# Patient Record
Sex: Female | Born: 1990 | Race: White | Hispanic: No | Marital: Married | Smoking: Never smoker
Health system: Southern US, Community
[De-identification: ages and names within clinical notes are randomized; demographics above are authoritative.]

## PROBLEM LIST (undated history)

## (undated) DIAGNOSIS — Z8759 Personal history of other complications of pregnancy, childbirth and the puerperium: Secondary | ICD-10-CM

## (undated) DIAGNOSIS — N390 Urinary tract infection, site not specified: Secondary | ICD-10-CM

## (undated) DIAGNOSIS — K589 Irritable bowel syndrome without diarrhea: Secondary | ICD-10-CM

## (undated) DIAGNOSIS — R519 Headache, unspecified: Secondary | ICD-10-CM

## (undated) DIAGNOSIS — F419 Anxiety disorder, unspecified: Secondary | ICD-10-CM

## (undated) DIAGNOSIS — N83209 Unspecified ovarian cyst, unspecified side: Secondary | ICD-10-CM

---

## 2015-12-18 DIAGNOSIS — F419 Anxiety disorder, unspecified: Secondary | ICD-10-CM | POA: Diagnosis not present

## 2015-12-18 DIAGNOSIS — Z6831 Body mass index (BMI) 31.0-31.9, adult: Secondary | ICD-10-CM | POA: Diagnosis not present

## 2015-12-18 DIAGNOSIS — Z309 Encounter for contraceptive management, unspecified: Secondary | ICD-10-CM | POA: Diagnosis not present

## 2015-12-25 DIAGNOSIS — Z309 Encounter for contraceptive management, unspecified: Secondary | ICD-10-CM | POA: Diagnosis not present

## 2016-03-17 DIAGNOSIS — Z309 Encounter for contraceptive management, unspecified: Secondary | ICD-10-CM | POA: Diagnosis not present

## 2016-06-09 DIAGNOSIS — Z309 Encounter for contraceptive management, unspecified: Secondary | ICD-10-CM | POA: Diagnosis not present

## 2016-06-29 DIAGNOSIS — Z6830 Body mass index (BMI) 30.0-30.9, adult: Secondary | ICD-10-CM | POA: Diagnosis not present

## 2016-06-29 DIAGNOSIS — F419 Anxiety disorder, unspecified: Secondary | ICD-10-CM | POA: Diagnosis not present

## 2016-08-26 DIAGNOSIS — Z309 Encounter for contraceptive management, unspecified: Secondary | ICD-10-CM | POA: Diagnosis not present

## 2016-12-21 DIAGNOSIS — Z309 Encounter for contraceptive management, unspecified: Secondary | ICD-10-CM | POA: Diagnosis not present

## 2017-03-17 DIAGNOSIS — K589 Irritable bowel syndrome without diarrhea: Secondary | ICD-10-CM | POA: Diagnosis not present

## 2017-03-17 DIAGNOSIS — F419 Anxiety disorder, unspecified: Secondary | ICD-10-CM | POA: Diagnosis not present

## 2017-03-17 DIAGNOSIS — Z309 Encounter for contraceptive management, unspecified: Secondary | ICD-10-CM | POA: Diagnosis not present

## 2017-03-17 DIAGNOSIS — Z6831 Body mass index (BMI) 31.0-31.9, adult: Secondary | ICD-10-CM | POA: Diagnosis not present

## 2017-03-30 DIAGNOSIS — Z6831 Body mass index (BMI) 31.0-31.9, adult: Secondary | ICD-10-CM | POA: Diagnosis not present

## 2017-03-30 DIAGNOSIS — R197 Diarrhea, unspecified: Secondary | ICD-10-CM | POA: Diagnosis not present

## 2017-03-30 DIAGNOSIS — R1110 Vomiting, unspecified: Secondary | ICD-10-CM | POA: Diagnosis not present

## 2017-04-07 DIAGNOSIS — R102 Pelvic and perineal pain: Secondary | ICD-10-CM | POA: Diagnosis not present

## 2017-04-07 DIAGNOSIS — Z0000 Encounter for general adult medical examination without abnormal findings: Secondary | ICD-10-CM | POA: Diagnosis not present

## 2017-04-07 DIAGNOSIS — Z01419 Encounter for gynecological examination (general) (routine) without abnormal findings: Secondary | ICD-10-CM | POA: Diagnosis not present

## 2017-04-07 DIAGNOSIS — N949 Unspecified condition associated with female genital organs and menstrual cycle: Secondary | ICD-10-CM | POA: Diagnosis not present

## 2017-04-28 DIAGNOSIS — N6310 Unspecified lump in the right breast, unspecified quadrant: Secondary | ICD-10-CM | POA: Diagnosis not present

## 2017-04-28 DIAGNOSIS — F419 Anxiety disorder, unspecified: Secondary | ICD-10-CM | POA: Diagnosis not present

## 2017-04-28 DIAGNOSIS — N6320 Unspecified lump in the left breast, unspecified quadrant: Secondary | ICD-10-CM | POA: Diagnosis not present

## 2017-04-28 DIAGNOSIS — Z889 Allergy status to unspecified drugs, medicaments and biological substances status: Secondary | ICD-10-CM | POA: Diagnosis not present

## 2017-10-19 DIAGNOSIS — Z6832 Body mass index (BMI) 32.0-32.9, adult: Secondary | ICD-10-CM | POA: Diagnosis not present

## 2017-10-19 DIAGNOSIS — E669 Obesity, unspecified: Secondary | ICD-10-CM | POA: Diagnosis not present

## 2017-10-19 DIAGNOSIS — R102 Pelvic and perineal pain: Secondary | ICD-10-CM | POA: Diagnosis not present

## 2017-10-28 DIAGNOSIS — R1012 Left upper quadrant pain: Secondary | ICD-10-CM | POA: Diagnosis not present

## 2017-10-28 DIAGNOSIS — R102 Pelvic and perineal pain: Secondary | ICD-10-CM | POA: Diagnosis not present

## 2017-11-16 DIAGNOSIS — R102 Pelvic and perineal pain: Secondary | ICD-10-CM | POA: Diagnosis not present

## 2018-05-09 DIAGNOSIS — R509 Fever, unspecified: Secondary | ICD-10-CM | POA: Insufficient documentation

## 2019-01-25 DIAGNOSIS — N12 Tubulo-interstitial nephritis, not specified as acute or chronic: Secondary | ICD-10-CM

## 2019-01-25 HISTORY — DX: Tubulo-interstitial nephritis, not specified as acute or chronic: N12

## 2019-01-26 DIAGNOSIS — N390 Urinary tract infection, site not specified: Secondary | ICD-10-CM | POA: Insufficient documentation

## 2019-11-27 ENCOUNTER — Encounter: Payer: Self-pay | Admitting: *Deleted

## 2019-11-29 ENCOUNTER — Other Ambulatory Visit: Payer: Self-pay

## 2019-11-29 ENCOUNTER — Encounter (HOSPITAL_COMMUNITY): Payer: Self-pay | Admitting: Emergency Medicine

## 2019-11-29 ENCOUNTER — Ambulatory Visit (HOSPITAL_COMMUNITY)
Admission: EM | Admit: 2019-11-29 | Discharge: 2019-11-29 | Discharge status: Against Medical Advice | Payer: BC Managed Care – PPO | Source: Home / Self Care

## 2019-11-29 ENCOUNTER — Emergency Department (HOSPITAL_COMMUNITY)
Admission: EM | Admit: 2019-11-29 | Discharge: 2019-11-29 | Disposition: A | Discharge status: Home / Self Care | Payer: BC Managed Care – PPO | Attending: Emergency Medicine | Admitting: Emergency Medicine

## 2019-11-29 DIAGNOSIS — Z5321 Procedure and treatment not carried out due to patient leaving prior to being seen by health care provider: Secondary | ICD-10-CM | POA: Insufficient documentation

## 2019-11-29 DIAGNOSIS — O209 Hemorrhage in early pregnancy, unspecified: Secondary | ICD-10-CM | POA: Diagnosis present

## 2019-11-29 DIAGNOSIS — Z3A01 Less than 8 weeks gestation of pregnancy: Secondary | ICD-10-CM | POA: Diagnosis not present

## 2019-11-29 LAB — I-STAT BETA HCG BLOOD, ED (MC, WL, AP ONLY): I-stat hCG, quantitative: <5 m[IU]/mL (ref <5)

## 2019-11-29 NOTE — ED Provider Notes (Cosign Needed)
29 year old G28P0 female presents with pelvic camping and vaginal bleeding that started at 12:30pm today. Pt reports bleeding is like a period but there are no clots. Pelvic cramping is intermittent and mild. She does not have OBGYN set up yet. She had a pregnancy test done at [facility] on 7/23 however hcg was only 17.  On exam BP and HR are mildly elevated. She is alert, mildly tearful. Abdomen is soft and non-tender.  Hcg here is 5. Likely pt never had a true positive test vs she miscarried   [place] Born, Cordelia Poche 11/29/19 1906

## 2019-11-29 NOTE — ED Notes (Signed)
Pt reports that she cannot wait any longer. Encouraged to stay.

## 2019-11-29 NOTE — ED Triage Notes (Signed)
Pt reports being [redacted] weeks pregnant and today at noon developed lower abdominal pain, back pain, and vaginal bleeding.

## 2020-01-08 ENCOUNTER — Encounter: Payer: Self-pay | Admitting: Family Medicine

## 2020-01-08 ENCOUNTER — Encounter: Payer: BC Managed Care – PPO | Admitting: Family Medicine

## 2020-01-08 NOTE — Progress Notes (Signed)
Patient did not keep appointment today. She may call to reschedule.  

## 2020-01-23 ENCOUNTER — Ambulatory Visit
Admission: RE | Admit: 2020-01-23 | Discharge: 2020-01-23 | Disposition: A | Discharge status: Home / Self Care | Payer: BC Managed Care – PPO | Source: Ambulatory Visit

## 2020-01-23 ENCOUNTER — Other Ambulatory Visit: Payer: Self-pay

## 2020-01-23 VITALS — BP 113/79 | HR 94 | Temp 99.1°F | Resp 18 | Wt 178.0 lb

## 2020-01-23 DIAGNOSIS — H938X3 Other specified disorders of ear, bilateral: Secondary | ICD-10-CM | POA: Diagnosis not present

## 2020-01-23 DIAGNOSIS — H6123 Impacted cerumen, bilateral: Secondary | ICD-10-CM

## 2020-01-23 NOTE — ED Provider Notes (Signed)
[facility]   [id number] 01/23/20 Arrival Time: 1011  CC: EAR PAIN  SUBJECTIVE: History from: patient.  Dominique Vega is a 29 y.o. female who presents with of right ear pain for the past 4 days. Also reports bilateral ear fullness. Denies a precipitating event, such as swimming or wearing ear plugs. Patient states the pain is constant and achy in character. Patient has not taken OTC medications for this. Has tried debrox drops. Symptoms are made worse with lying down. Denies fever, chills, fatigue, sinus pain, rhinorrhea, ear discharge, sore throat, SOB, wheezing, chest pain, nausea, changes in bowel or bladder habits.    ROS: As per HPI.  All other pertinent ROS negative.     History reviewed. No pertinent past medical history. History reviewed. No pertinent surgical history. Allergies  Allergen Reactions  . Penicillins Diarrhea and Nausea And Vomiting   No current facility-administered medications on file prior to encounter.   Current Outpatient Medications on File Prior to Encounter  Medication Sig Dispense Refill  . escitalopram (LEXAPRO) 20 MG tablet Take 20 mg by mouth daily.    Marland Kitchen escitalopram (LEXAPRO) 20 MG tablet Take by mouth.     Social History   Socioeconomic History  . Marital status: Married    Spouse name: Not on file  . Number of children: Not on file  . Years of education: Not on file  . Highest education level: Not on file  Occupational History  . Not on file  Tobacco Use  . Smoking status: Never Smoker  . Smokeless tobacco: Never Used  Substance and Sexual Activity  . Alcohol use: Not Currently  . Drug use: Not on file  . Sexual activity: Yes    Birth control/protection: None  Other Topics Concern  . Not on file  Social History Narrative  . Not on file   Social Determinants of Health   Financial Resource Strain:   . Difficulty of Paying Living Expenses: Not on file  Food Insecurity:   . Worried About Programme researcher, broadcasting/film/video in the Last  Year: Not on file  . Ran Out of Food in the Last Year: Not on file  Transportation Needs:   . Lack of Transportation (Medical): Not on file  . Lack of Transportation (Non-Medical): Not on file  Physical Activity:   . Days of Exercise per Week: Not on file  . Minutes of Exercise per Session: Not on file  Stress:   . Feeling of Stress : Not on file  Social Connections:   . Frequency of Communication with Friends and Family: Not on file  . Frequency of Social Gatherings with Friends and Family: Not on file  . Attends Religious Services: Not on file  . Active Member of Clubs or Organizations: Not on file  . Attends Banker Meetings: Not on file  . Marital Status: Not on file  Intimate Partner Violence:   . Fear of Current or Ex-Partner: Not on file  . Emotionally Abused: Not on file  . Physically Abused: Not on file  . Sexually Abused: Not on file   Family History  Problem Relation Age of Onset  . Hypertension Mother   . Diabetes Father     OBJECTIVE:  Vitals:   01/23/20 1028 01/23/20 1031  BP:  113/79  Pulse:  94  Resp:  18  Temp:  99.1 F (37.3 C)  TempSrc:  Oral  SpO2:  96%  Weight: 178 lb (80.7 kg)  General appearance: alert; appears fatigued HEENT: Ears: bilateral EACs with cerumen impaction, TMs pearly gray with visible cone of light, without erythema; Eyes: PERRL, EOMI grossly; Sinuses nontender to palpation; Nose: clear rhinorrhea; Throat: oropharynx mildly erythematous, tonsils 1+ without white tonsillar exudates, uvula midline Neck: supple without LAD Lungs: unlabored respirations, symmetrical air entry; cough: absent; no respiratory distress, CTAB Heart: regular rate and rhythm.  Radial pulses 2+ symmetrical bilaterally Skin: warm and dry Psychological: alert and cooperative; normal mood and affect  Imaging: No results found.   ASSESSMENT & PLAN:  1. Bilateral impacted cerumen   2. Sensation of fullness in both ears     Bilateral  ear irrigation in office May use 1/2 water and 1/2 peroxide solution to keep ears clear of wax and help prevent future buildup Continue to use OTC ibuprofen and/ or tylenol as needed for pain control Follow up with PCP if symptoms persists Return here or go to the ER if you have any new or worsening symptoms   Reviewed expectations re: course of current medical issues. Questions answered. Outlined signs and symptoms indicating need for more acute intervention. Patient verbalized understanding. After Visit Summary given.         Moshe Cipro, NP 01/23/20 1117

## 2020-01-23 NOTE — Discharge Instructions (Addendum)
We have cleaned out your ears in the office today.  You may use a solution of half water and half peroxide at home to clean your ears 2-3 times a week after this to help prevent future wax buildup  Follow-up with this office or with primary care as needed

## 2020-01-23 NOTE — ED Triage Notes (Signed)
Pt is here with bilateral ear pain that started Sunday, pt has not taken ear wax removal drops to relieve discomfort.

## 2020-02-04 ENCOUNTER — Other Ambulatory Visit: Payer: Self-pay | Admitting: Obstetrics & Gynecology

## 2020-02-04 ENCOUNTER — Telehealth: Payer: Self-pay

## 2020-02-04 DIAGNOSIS — O209 Hemorrhage in early pregnancy, unspecified: Secondary | ICD-10-CM

## 2020-02-04 NOTE — Telephone Encounter (Signed)
Sounds like this is taken care of?

## 2020-02-04 NOTE — Telephone Encounter (Signed)
Pt's husband called triage line this morning stating his wife is [redacted] weeks pregnant. She was having spotting and cramping this weekend. They went to ER in [facility] and [facility] told she needed to follow up with her OB. She has an appointment with [facility] tomorrow but pt is having a little heavier bleeding and they wanted to know if she could be seen today.

## 2020-02-04 NOTE — Telephone Encounter (Signed)
Sent secure chat to [facility] due to patient seeing him tomorrow at 1:30. [facility] prefers patient to have an ultrasound prior to appointment. Patient is aware to arrive at 12:50 for her 1:00 pm ultrasound appointment.

## 2020-02-05 ENCOUNTER — Encounter: Payer: BC Managed Care – PPO | Admitting: Obstetrics & Gynecology

## 2020-02-05 ENCOUNTER — Ambulatory Visit (INDEPENDENT_AMBULATORY_CARE_PROVIDER_SITE_OTHER): Payer: BC Managed Care – PPO | Admitting: Obstetrics & Gynecology

## 2020-02-05 ENCOUNTER — Encounter: Payer: Self-pay | Admitting: Obstetrics & Gynecology

## 2020-02-05 ENCOUNTER — Other Ambulatory Visit: Payer: Self-pay

## 2020-02-05 ENCOUNTER — Ambulatory Visit (INDEPENDENT_AMBULATORY_CARE_PROVIDER_SITE_OTHER): Payer: BC Managed Care – PPO

## 2020-02-05 VITALS — BP 120/80 | Ht 60.0 in | Wt 175.0 lb

## 2020-02-05 DIAGNOSIS — O209 Hemorrhage in early pregnancy, unspecified: Secondary | ICD-10-CM

## 2020-02-05 DIAGNOSIS — O039 Complete or unspecified spontaneous abortion without complication: Secondary | ICD-10-CM

## 2020-02-05 NOTE — Progress Notes (Signed)
Obstetric Problem Visit   Chief Complaint: First trimester bleeding  History of Present Illness: Patient is a 29 y.o. [id number] Unknown presenting for first trimester bleeding.  The onset of bleeding was last week.  Is bleeding equal to or greater than normal menstrual flow:  Yes    Also, heavy bleeding and ? Tissue last evening    Some crampy abd pain Any recent trauma:  No Recent intercourse:  No History of prior miscarriage:  Yes     11/2019 SAB, then got pregnant again (current ) prior to normal period    2019, also had first trimester miscarriage Prior ultrasound demonstrating IUP:  Yes Prior ultrasound demonstrating viable IUP:  No Prior Serum HCG:  No Rh status: POS  PMHx: She  has no past medical history on file. Also,  has no past surgical history on file., family history includes Diabetes in her father; Hypertension in her mother.,  reports that she has never smoked. She has never used smokeless tobacco. She reports previous alcohol use.  She has a current medication list which includes the following prescription(s): escitalopram and escitalopram. Also, is allergic to penicillins.  Review of Systems  Constitutional: Negative for chills, fever and malaise/fatigue.  HENT: Negative for congestion, sinus pain and sore throat.   Eyes: Negative for blurred vision and pain.  Respiratory: Negative for cough and wheezing.   Cardiovascular: Negative for chest pain and leg swelling.  Gastrointestinal: Negative for abdominal pain, constipation, diarrhea, heartburn, nausea and vomiting.  Genitourinary: Negative for dysuria, frequency, hematuria and urgency.  Musculoskeletal: Negative for back pain, joint pain, myalgias and neck pain.  Skin: Negative for itching and rash.  Neurological: Negative for dizziness, tremors and weakness.  Endo/Heme/Allergies: Does not bruise/bleed easily.  Psychiatric/Behavioral: Negative for depression. The patient is not nervous/anxious and does not have  insomnia.     Objective: Vitals:   02/05/20 1342  BP: 120/80   Physical Exam Constitutional:      General: She is not in acute distress.    Appearance: She is well-developed.  Musculoskeletal:        General: Normal range of motion.  Neurological:     Mental Status: She is alert and oriented to person, place, and time.  Skin:    General: Skin is warm and dry.  Vitals reviewed.   See [place] results from today Findings:  No intra/extrauterine pregnancy is seen today.  The endometrium varies in thickness and is heterogeneous.  There is no increased blood flow to suggest retained products of conception.  Assessment: 29 y.o. G3P0030 Unknown 1. Complete abortion   Plan:         Condolences were offered to the patient and her family.  I stressed that while emotionally difficult, that this did not occur because of an actions or inactions by the patient.  Somewhere between 10-20% of identified first trimester pregnancies will unfortunately end in miscarriage.  Given this relatively high incidence rate, further diagnostic testing such as chromosome analysis is generally not clinically relevant nor recommended.  Although the chromosomal abnormalities have been implicated at rates as high as 70% in some studies, these are generally random and do not infer and increased risk of recurrence with subsequent pregnancies.  However, 3 or more consecutive first trimester losses are relatively uncommon, and these patient generally do benefit from additional work up to determine a potential modifiable etiology.              We briefly discussed management options including expectant  management, medical management, and surgical management as well as their relative success rates and complications. Approximately 80% of first trimester miscarriages will pass successfully but may require a time frame of up to 8 weeks (ACOG Practice Bulletin 150 May 2015 "Early Pregnancy Loss").  Medical management has literature  supporting its use up to 63 days or [redacted]w[redacted]d gestation and results in a passage rate of 84-85% Environmental education officer Bulletin 143 March 2014 "Medical Management of First-Trimester Abortion").  Dilation and curettage has the highest rate of uterine evacuation, but carries with is operative cost, surgical and anesthetic risk.  While these risk are relatively small they nevertheless include infection, bleeding, uterine perforation, formation of uterine synechia, and in rare cases death.              We discussed repeat ultrasound and or trending HCG levels if the patient wishes to pursue these prior to making her decision.  Clinically I am confident of the diagnosis, but I do not want any doubts in the patient's mind regarding the plan of management she chooses to adopt. The patient elects to proceed with surgical management for her missed abortion. I have discussed with the patient the indications for the procedure. Included in the discussion were the options of therapy, as wall as their individual risks, benefits, and complications. Ample time was given to answer all questions.   While the incidence is low, the risks from this surgery include, but are not limited to, the risks of anesthesia, hemorrhage, infection, perforation, and injury to adjacent structures including bowel, bladder and blood vessels.   2) The patient is Rh POS, rhogam is therefore not indicated to decrease the risk rhesus alloimmunization.    3) Routine bleeding precautions were discussed with the patient prior the conclusion of today's visit.  Plans to try for preg again soon. Will see back in 4-6 weeks to assess again and PAP  Annamarie Major, MD, [facility], [facility] 02/05/2020  1:56 PM

## 2020-02-05 NOTE — Patient Instructions (Signed)
Managing Pregnancy Loss Pregnancy loss can happen any time during a pregnancy. Often the cause is not known. It is rarely because of anything you did. Pregnancy loss in early pregnancy (during the first trimester) is called a miscarriage. This type of pregnancy loss is the most common. Pregnancy loss that happens after 20 weeks of pregnancy is called fetal demise if the baby's heart stops beating before birth. Fetal demise is much less common. Some women experience spontaneous labor shortly after fetal demise resulting in a stillborn birth (stillbirth). Any pregnancy loss can be devastating. You will need to recover both physically and emotionally. Most women are able to get pregnant again after a pregnancy loss and deliver a healthy baby. How to manage emotional recovery  Pregnancy loss is very hard emotionally. You may feel many different emotions while you grieve. You may feel sad and angry. You may also feel guilty. It is normal to have periods of crying. Emotional recovery can take longer than physical recovery. It is different for everyone. Taking these steps can help you in managing this loss:  Remember that it is unlikely you did anything to cause the pregnancy loss.  Share your thoughts and feelings with friends, family, and your partner. Remember that your partner is also recovering emotionally.  Make sure you have a good support system. Do not spend too much time alone.  Meet with a pregnancy loss counselor or join a pregnancy loss support group.  Get enough sleep and eat a healthy diet. Return to regular exercise when you have recovered physically.  Do not use drugs or alcohol to manage your emotions.  Consider seeing a mental health professional to help you recover emotionally.  Ask a friend or loved one to help you decide what to do with any clothing and nursery items you received for your baby. In the case of a stillbirth, many women benefit from taking additional steps in the  grieving process. You may want to:  Hold your baby after the birth.  Name your baby.  Request a birth certificate.  Create a keepsake such as handprints or footprints.  Dress your baby and have a picture taken.  Make funeral arrangements.  Ask for a baptism or blessing. Hospitals have staff members who can help you with all these arrangements. How to recognize emotional stress It is normal to have emotional stress after a pregnancy loss. But emotional stress that lasts a long time or becomes severe requires treatment. Watch out for these signs of severe emotional stress:  Sadness, anger, or guilt that is not going away and is interfering with your normal activities.  Relationship problems that have occurred or gotten worse since the pregnancy loss.  Signs of depression that last longer than 2 weeks. These may include: ? Sadness. ? Anxiety. ? Hopelessness. ? Loss of interest in activities you enjoy. ? Inability to concentrate. ? Trouble sleeping or sleeping too much. ? Loss of appetite or overeating. ? Thoughts of death or of hurting yourself. Follow these instructions at home:  Take over-the-counter and prescription medicines only as told by your health care provider.  Rest at home until your energy level returns. Return to your normal activities as told by your health care provider. Ask your health care provider what activities are safe for you.  When you are ready, meet with your health care provider to discuss steps to take for a future pregnancy.  Keep all follow-up visits as told by your health care provider. This is important.   Where to find support  To help you and your partner with the process of grieving, talk with your health care provider or seek counseling.  Consider meeting with others who have experienced pregnancy loss. Ask your health care provider about support groups and resources. Where to find more information  [place] Department of Health and Human  Services Office on Women's Health: www.womenshealth.gov  American Pregnancy Association: www.americanpregnancy.org Contact a health care provider if:  You continue to experience grief, sadness, or lack of motivation for everyday activities, and those feelings do not improve over time.  You are struggling to recover emotionally, especially if you are using alcohol or substances to help. Get help right away if:  You have thoughts of hurting yourself or others. If you ever feel like you may hurt yourself or others, or have thoughts about taking your own life, get help right away. You can go to your nearest emergency department or call:  Your local emergency services (911 in the [place]  A suicide crisis helpline, such as the National Suicide Prevention Lifeline at 1-800-273-8255. This is open 24 hours a day. Summary  Any pregnancy loss can be difficult physically and emotionally.  You may experience many different emotions while you grieve. Emotional recovery can last longer than physical recovery.  It is normal to have emotional stress after a pregnancy loss. But emotional stress that lasts a long time or becomes severe requires treatment.  See your health care provider if you are struggling emotionally after a pregnancy loss. This information is not intended to replace advice given to you by your health care provider. Make sure you discuss any questions you have with your health care provider. Document Revised: 08/09/2018 Document Reviewed: 06/30/2017 Elsevier Patient Education  2020 [employer]vier [employer].  

## 2020-02-06 ENCOUNTER — Encounter: Payer: Self-pay | Admitting: Obstetrics & Gynecology

## 2020-02-06 NOTE — Progress Notes (Signed)
Changed to GYN encounter

## 2020-02-11 ENCOUNTER — Other Ambulatory Visit: Payer: Self-pay

## 2020-02-11 ENCOUNTER — Emergency Department
Admission: EM | Admit: 2020-02-11 | Discharge: 2020-02-11 | Disposition: A | Discharge status: Home / Self Care | Payer: BC Managed Care – PPO | Attending: Emergency Medicine | Admitting: Emergency Medicine

## 2020-02-11 ENCOUNTER — Emergency Department: Payer: BC Managed Care – PPO

## 2020-02-11 DIAGNOSIS — R102 Pelvic and perineal pain: Secondary | ICD-10-CM

## 2020-02-11 DIAGNOSIS — K529 Noninfective gastroenteritis and colitis, unspecified: Secondary | ICD-10-CM | POA: Diagnosis not present

## 2020-02-11 DIAGNOSIS — N760 Acute vaginitis: Secondary | ICD-10-CM | POA: Insufficient documentation

## 2020-02-11 DIAGNOSIS — R1111 Vomiting without nausea: Secondary | ICD-10-CM | POA: Diagnosis present

## 2020-02-11 DIAGNOSIS — B9689 Other specified bacterial agents as the cause of diseases classified elsewhere: Secondary | ICD-10-CM | POA: Insufficient documentation

## 2020-02-11 DIAGNOSIS — O039 Complete or unspecified spontaneous abortion without complication: Secondary | ICD-10-CM | POA: Diagnosis not present

## 2020-02-11 DIAGNOSIS — Z3A00 Weeks of gestation of pregnancy not specified: Secondary | ICD-10-CM | POA: Diagnosis not present

## 2020-02-11 LAB — URINALYSIS, COMPLETE (UACMP) WITH MICROSCOPIC
Bacteria, UA: NONE SEEN
Bilirubin Urine: NEGATIVE
Glucose, UA: NEGATIVE mg/dL
Ketones, ur: NEGATIVE mg/dL
Leukocytes,Ua: NEGATIVE
Nitrite: NEGATIVE
Protein, ur: NEGATIVE mg/dL
Specific Gravity, Urine: 1.011 (ref 1.005–1.030)
pH: 6 (ref 5.0–8.0)

## 2020-02-11 LAB — COMPREHENSIVE METABOLIC PANEL
ALT: 22 U/L (ref 0–44)
AST: 19 U/L (ref 15–41)
Albumin: 4.2 g/dL (ref 3.5–5.0)
Alkaline Phosphatase: 52 U/L (ref 38–126)
Anion gap: 10 (ref 5–15)
BUN: 9 mg/dL (ref 6–20)
CO2: 22 mmol/L (ref 22–32)
Calcium: 9.1 mg/dL (ref 8.9–10.3)
Chloride: 105 mmol/L (ref 98–111)
Creatinine, Ser: 0.7 mg/dL (ref 0.44–1.00)
GFR, Estimated: >60 mL/min (ref >60)
Glucose, Bld: 92 mg/dL (ref 70–99)
Potassium: 4.5 mmol/L (ref 3.5–5.1)
Sodium: 137 mmol/L (ref 135–145)
Total Bilirubin: 0.7 mg/dL (ref 0.3–1.2)
Total Protein: 7.4 g/dL (ref 6.5–8.1)

## 2020-02-11 LAB — CBC
HCT: 40.4 % (ref 36.0–46.0)
Hemoglobin: 13.6 g/dL (ref 12.0–15.0)
MCH: 30.2 pg (ref 26.0–34.0)
MCHC: 33.7 g/dL (ref 30.0–36.0)
MCV: 89.6 fL (ref 80.0–100.0)
Platelets: 364 10*3/uL (ref 150–400)
RBC: 4.51 MIL/uL (ref 3.87–5.11)
RDW: 12.8 % (ref 11.5–15.5)
WBC: 9.8 10*3/uL (ref 4.0–10.5)
nRBC: 0 % (ref 0.0–0.2)

## 2020-02-11 LAB — HCG, QUANTITATIVE, PREGNANCY: hCG, Beta Chain, Quant, S: 58 m[IU]/mL — ABNORMAL HIGH (ref <5)

## 2020-02-11 LAB — CHLAMYDIA/NGC RT PCR (ARMC ONLY)
Chlamydia Tr: NOT DETECTED
N gonorrhoeae: NOT DETECTED

## 2020-02-11 LAB — WET PREP, GENITAL
Sperm: NONE SEEN
Trich, Wet Prep: NONE SEEN
Yeast Wet Prep HPF POC: NONE SEEN

## 2020-02-11 LAB — LIPASE, BLOOD: Lipase: 24 U/L (ref 11–51)

## 2020-02-11 MED ORDER — ONDANSETRON HCL 4 MG/2ML IJ SOLN: 4.0000 mg | Freq: Once | INTRAMUSCULAR | Status: DC

## 2020-02-11 MED ORDER — LACTATED RINGERS IV BOLUS: 1000.0000 mL | Freq: Once | INTRAVENOUS | Status: DC

## 2020-02-11 MED ORDER — METRONIDAZOLE 500 MG PO TABS: 500.0000 mg | ORAL_TABLET | Freq: Two times a day (BID) | ORAL | 0 refills | Status: AC

## 2020-02-11 MED ORDER — ONDANSETRON 4 MG PO TBDP: 4.0000 mg | ORAL_TABLET | Freq: Three times a day (TID) | ORAL | 0 refills | Status: DC | PRN

## 2020-02-11 NOTE — ED Triage Notes (Signed)
Pt states she had a miscarriage last week and for the past couple of days having vomiting with low grade temp 100.2 and painful urination. States the vaginal bleeding has almost stopped and is having minimal abd pain.

## 2020-02-11 NOTE — ED Notes (Signed)
Pelvic cart done  Specimen to lab  Pt tolerated well.

## 2020-02-11 NOTE — ED Provider Notes (Signed)
[facility] Emergency Department Provider Note   ____________________________________________   First MD Initiated Contact with Patient 02/11/20 1932     (approximate)  I have reviewed the triage vital signs and the nursing notes.   HISTORY  Chief Complaint Emesis    HPI Dominique Vega is a 29 y.o. female with no significant past medical history who presents to the ED complaining of nausea and vomiting with abdominal pain.  Patient states that she had a miscarriage last week and seem to be doing better overall with improving and almost resolving vaginal bleeding.  However overnight she developed diffuse abdominal pain with nausea, vomiting, and diarrhea.  When she woke up this morning, she had a fever of 101 at home, subsequently took ibuprofen with improvement.  She denies any cough, congestion, shortness of breath, or chest pain.  She did have some burning when she urinated earlier and also endorses some bilateral lower back pain.  She was initially evaluated at an urgent care, but referred to the ED for further evaluation.        History reviewed. No pertinent past medical history.  There are no problems to display for this patient.   History reviewed. No pertinent surgical history.  Prior to Admission medications   Medication Sig Start Date End Date Taking? Authorizing Provider  escitalopram (LEXAPRO) 20 MG tablet Take 20 mg by mouth daily. 12/12/19   [provider]  escitalopram (LEXAPRO) 20 MG tablet Take by mouth. Patient not taking: Reported on 02/05/2020    [provider]  metroNIDAZOLE (FLAGYL) 500 MG tablet Take 1 tablet (500 mg total) by mouth 2 (two) times daily for 7 days. 02/11/20 02/18/20  Chesley Noon, MD  ondansetron (ZOFRAN ODT) 4 MG disintegrating tablet Take 1 tablet (4 mg total) by mouth every 8 (eight) hours as needed for nausea or vomiting. 02/11/20   Chesley Noon, MD    Allergies Penicillins  Family  History  Problem Relation Age of Onset  . Hypertension Mother   . Diabetes Father     Social History Social History   Tobacco Use  . Smoking status: Never Smoker  . Smokeless tobacco: Never Used  Substance Use Topics  . Alcohol use: Not Currently  . Drug use: Not on file    Review of Systems  Constitutional: Positive for fever/chills Eyes: No visual changes. ENT: No sore throat. Cardiovascular: Denies chest pain. Respiratory: Denies shortness of breath. Gastrointestinal: Positive for abdominal pain, nausea, vomiting, and diarrhea.  No constipation. Genitourinary: Positive for dysuria. Musculoskeletal: Positive for back pain. Skin: Negative for rash. Neurological: Negative for headaches, focal weakness or numbness.  ____________________________________________   PHYSICAL EXAM:  VITAL SIGNS: ED Triage Vitals  Enc Vitals Group     BP 02/11/20 1341 123/85     Pulse Rate 02/11/20 1341 94     Resp 02/11/20 1341 16     Temp 02/11/20 1341 98.2 F (36.8 C)     Temp Source 02/11/20 1341 Oral     SpO2 02/11/20 1341 98 %     Weight 02/11/20 1338 170 lb (77.1 kg)     Height 02/11/20 1338 5' (1.524 m)     Head Circumference --      Peak Flow --      Pain Score 02/11/20 1346 2     Pain Loc --      Pain Edu? --      Excl. in GC? --     Constitutional: Alert and oriented.  Eyes: Conjunctivae are normal. Head: Atraumatic. Nose: No congestion/rhinnorhea. Mouth/Throat: Mucous membranes are moist. Neck: Normal ROM Cardiovascular: Normal rate, regular rhythm. Grossly normal heart sounds. Respiratory: Normal respiratory effort.  No retractions. Lungs CTAB. Gastrointestinal: Soft and tender to palpation in the bilateral lower quadrants. No distention. Genitourinary: Brownish discharge noted with some mild cervical motion tenderness and right adnexal tenderness. Musculoskeletal: No lower extremity tenderness nor edema. Neurologic:  Normal speech and language. No gross focal  neurologic deficits are appreciated. Skin:  Skin is warm, dry and intact. No rash noted. Psychiatric: Mood and affect are normal. Speech and behavior are normal.  ____________________________________________   LABS (all labs ordered are listed, but only abnormal results are displayed)  Labs Reviewed  WET PREP, GENITAL - Abnormal; Notable for the following components:      Result Value   Clue Cells Wet Prep HPF POC PRESENT (*)    WBC, Wet Prep HPF POC MANY (*)    All other components within normal limits  URINALYSIS, COMPLETE (UACMP) WITH MICROSCOPIC - Abnormal; Notable for the following components:   Color, Urine YELLOW (*)    APPearance HAZY (*)    Hgb urine dipstick SMALL (*)    All other components within normal limits  HCG, QUANTITATIVE, PREGNANCY - Abnormal; Notable for the following components:   hCG, Beta Chain, Quant, S 58 (*)    All other components within normal limits  CHLAMYDIA/NGC RT PCR ([facility] ONLY)  LIPASE, BLOOD  COMPREHENSIVE METABOLIC PANEL  CBC    PROCEDURES  Procedure(s) performed (including Critical Care):  Procedures   ____________________________________________   INITIAL IMPRESSION / ASSESSMENT AND PLAN / ED COURSE       29 year old female, G3 P0-0-3-0 who presents to the ED following miscarriage about 1 week ago, now having increasing abdominal pain with nausea, vomiting, and diarrhea.  She has some tenderness to her lower abdomen on exam, pelvic exam also shows some brownish discharge.  Labs and vitals are thus far reassuring, no fever or leukocytosis noted.  We will further assess for retained products of conception with ultrasound.  I doubt UTI given unremarkable UA.  I would also consider gastroenteritis given her diarrhea if her ultrasound were to be unremarkable.  Ultrasound is negative for acute process, no findings to raise suspicion for retained products of conception.  Wet prep does show clue cells concerning for bacterial  vaginosis and we will start patient on Flagyl.  Given reassuring ultrasound, she is appropriate for discharge home with OB/GYN follow-up.  Given her diarrhea, her symptoms seem more likely to be related to gastroenteritis and she will be prescribed Zofran.  She was counseled to return to the ED for new worsening symptoms, patient agrees with plan.      ____________________________________________   FINAL CLINICAL IMPRESSION(S) / ED DIAGNOSES  Final diagnoses:  Gastroenteritis  Miscarriage  Bacterial vaginosis     ED Discharge Orders         Ordered    ondansetron (ZOFRAN ODT) 4 MG disintegrating tablet  Every 8 hours PRN        02/11/20 2046    metroNIDAZOLE (FLAGYL) 500 MG tablet  2 times daily        02/11/20 2051           Note:  This document was prepared using Dragon voice recognition software and may include unintentional dictation errors.   Chesley Noon, MD 02/11/20 2053

## 2020-02-18 ENCOUNTER — Encounter: Payer: Self-pay | Admitting: Allergy and Immunology

## 2020-02-25 ENCOUNTER — Other Ambulatory Visit: Payer: Self-pay

## 2020-02-25 ENCOUNTER — Encounter: Payer: Self-pay | Admitting: Obstetrics & Gynecology

## 2020-02-25 ENCOUNTER — Ambulatory Visit (INDEPENDENT_AMBULATORY_CARE_PROVIDER_SITE_OTHER): Payer: BC Managed Care – PPO | Admitting: Obstetrics & Gynecology

## 2020-02-25 VITALS — BP 120/70 | Ht 60.0 in | Wt 178.0 lb

## 2020-02-25 DIAGNOSIS — O039 Complete or unspecified spontaneous abortion without complication: Secondary | ICD-10-CM | POA: Diagnosis not present

## 2020-02-25 NOTE — Progress Notes (Signed)
  History of Present Illness:  Dominique Vega is a 29 y.o. G3P0030 WF who was noted to have miscarriage a couple of weeks ago; since that time has had one episode of severe nausea and vomiting; seen in ER and thought to have gastroenteritis.  Also tx for BV.   This did resolve over 1-2 days.  No sx's of BV.  No further bleeding or pelvic pain.  Has not had menses yet.  Desires pregnancy soon.  PMHx: She  has no past medical history on file. Also,  has no past surgical history on file., family history includes Diabetes in her father; Hypertension in her mother.,  reports that she has never smoked. She has never used smokeless tobacco. She reports previous alcohol use. Current Meds  Medication Sig  . escitalopram (LEXAPRO) 20 MG tablet Take 20 mg by mouth daily.  . Also, is allergic to penicillins..  Review of Systems  All other systems reviewed and are negative.   Physical Exam:  BP 120/70   Ht 5' (1.524 m)   Wt 178 lb (80.7 kg)   BMI 34.76 kg/m  Body mass index is 34.76 kg/m. Constitutional: Well nourished, well developed female in no acute distress.  Abdomen: diffusely non tender to palpation, non distended, and no masses, hernias Neuro: Grossly intact Psych:  Normal mood and affect.    Assessment:  Problem List Items Addressed This Visit   Visit Diagnoses    Complete abortion    - completed    Planning new attempt for pregnancy    Discussed options for testing for recurrent miscarriage, pros and cons and expectations    Desires to try for pregnancy after next normal cycle  No s/sx BV Annual/ PAP as scheduled   A total of 20 minutes were spent face-to-face with the patient as well as preparation, review, communication, and documentation during this encounter.   Annamarie Major, MD, [facility], [facility] 02/25/2020  4:39 PM

## 2020-03-10 ENCOUNTER — Ambulatory Visit: Payer: BC Managed Care – PPO | Admitting: Allergy and Immunology

## 2020-03-20 ENCOUNTER — Encounter: Payer: Self-pay | Admitting: Obstetrics & Gynecology

## 2020-03-20 ENCOUNTER — Ambulatory Visit (INDEPENDENT_AMBULATORY_CARE_PROVIDER_SITE_OTHER): Payer: BC Managed Care – PPO | Admitting: Obstetrics & Gynecology

## 2020-03-20 ENCOUNTER — Other Ambulatory Visit: Payer: Self-pay

## 2020-03-20 ENCOUNTER — Other Ambulatory Visit (HOSPITAL_COMMUNITY)
Admission: RE | Admit: 2020-03-20 | Discharge: 2020-03-20 | Disposition: A | Discharge status: Home / Self Care | Payer: BC Managed Care – PPO | Source: Ambulatory Visit | Attending: Obstetrics & Gynecology | Admitting: Obstetrics & Gynecology

## 2020-03-20 VITALS — BP 120/80 | Ht 60.0 in | Wt 177.0 lb

## 2020-03-20 DIAGNOSIS — Z124 Encounter for screening for malignant neoplasm of cervix: Secondary | ICD-10-CM | POA: Diagnosis not present

## 2020-03-20 DIAGNOSIS — Z01419 Encounter for gynecological examination (general) (routine) without abnormal findings: Secondary | ICD-10-CM

## 2020-03-20 NOTE — Patient Instructions (Signed)
Thank you for choosing [facility]. As part of our ongoing efforts to improve patient experience, we would appreciate your feedback. Please fill out the short survey that you will receive by mail or MyChart. Your opinion is important to us! -Dr Yarnell Kozloski  

## 2020-03-20 NOTE — Progress Notes (Signed)
HPI:      [name] is a 29 y.o. G3P0030 who LMP was Patient's last menstrual period was 03/09/2020., she presents today for her annual examination. The patient has no complaints today; she is recovering from 2 miscarriages this year, and does desire to try again for pregnnancy in the near future.  Counseling has helped with the recovery process.  The patient is sexually active. Her last pap: was normal. The patient does perform self breast exams.  There is notable family history of breast or ovarian cancer in her family (grandmother, 56s).  The patient has regular exercise: yes.  The patient denies current symptoms of depression.    GYN History: Contraception: none  PMHx: History reviewed. No pertinent past medical history. History reviewed. No pertinent surgical history. Family History  Problem Relation Age of Onset  . Hypertension Mother   . Diabetes Father    Social History   Tobacco Use  . Smoking status: Never Smoker  . Smokeless tobacco: Never Used  Substance Use Topics  . Alcohol use: Not Currently  . Drug use: Not on file    Current Outpatient Medications:  .  escitalopram (LEXAPRO) 20 MG tablet, Take 20 mg by mouth daily., Disp: , Rfl:  .  escitalopram (LEXAPRO) 20 MG tablet, Take by mouth. , Disp: , Rfl:  .  ondansetron (ZOFRAN ODT) 4 MG disintegrating tablet, Take 1 tablet (4 mg total) by mouth every 8 (eight) hours as needed for nausea or vomiting., Disp: 12 tablet, Rfl: 0 Allergies: Penicillins  Review of Systems  Constitutional: Negative for chills, fever and malaise/fatigue.  HENT: Negative for congestion, sinus pain and sore throat.   Eyes: Negative for blurred vision and pain.  Respiratory: Negative for cough and wheezing.   Cardiovascular: Negative for chest pain and leg swelling.  Gastrointestinal: Negative for abdominal pain, constipation, diarrhea, heartburn, nausea and vomiting.  Genitourinary: Negative for dysuria, frequency, hematuria and  urgency.  Musculoskeletal: Negative for back pain, joint pain, myalgias and neck pain.  Skin: Negative for itching and rash.  Neurological: Negative for dizziness, tremors and weakness.  Endo/Heme/Allergies: Does not bruise/bleed easily.  Psychiatric/Behavioral: Negative for depression. The patient is not nervous/anxious and does not have insomnia.     Objective: BP 120/80   Ht 5' (1.524 m)   Wt 177 lb (80.3 kg)   LMP 03/09/2020   BMI 34.57 kg/m   Filed Weights   03/20/20 1328  Weight: 177 lb (80.3 kg)   Body mass index is 34.57 kg/m. Physical Exam Constitutional:      General: She is not in acute distress.    Appearance: She is well-developed.  Genitourinary:     Pelvic exam was performed with patient supine.     Vagina, uterus and rectum normal.     No lesions in the vagina.     No vaginal bleeding.     No cervical motion tenderness, friability, lesion or polyp.     Uterus is mobile.     Uterus is not enlarged.     No uterine mass detected.    Uterus is midaxial.     No right or left adnexal mass present.     Right adnexa not tender.     Left adnexa not tender.  HENT:     Head: Normocephalic and atraumatic. No laceration.     Right Ear: Hearing normal.     Left Ear: Hearing normal.     Mouth/Throat:     Pharynx: Uvula  midline.  Eyes:     Pupils: Pupils are equal, round, and reactive to light.  Neck:     Thyroid: No thyromegaly.  Cardiovascular:     Rate and Rhythm: Normal rate and regular rhythm.     Heart sounds: No murmur heard.  No friction rub. No gallop.   Pulmonary:     Effort: Pulmonary effort is normal. No respiratory distress.     Breath sounds: Normal breath sounds. No wheezing.  Chest:     Breasts:        Right: No mass, skin change or tenderness.        Left: No mass, skin change or tenderness.  Abdominal:     General: Bowel sounds are normal. There is no distension.     Palpations: Abdomen is soft.     Tenderness: There is no abdominal  tenderness. There is no rebound.  Musculoskeletal:        General: Normal range of motion.     Cervical back: Normal range of motion and neck supple.  Neurological:     Mental Status: She is alert and oriented to person, place, and time.     Cranial Nerves: No cranial nerve deficit.  Skin:    General: Skin is warm and dry.  Psychiatric:        Judgment: Judgment normal.  Vitals reviewed.     Assessment:  ANNUAL EXAM 1. Women's annual routine gynecological examination   2. Screening for cervical cancer      Screening Plan:            1.  Cervical Screening-  Pap smear done today  2. Breast screening- Exam annually and mammogram>40 planned   3. Colonoscopy every 10 years, Hemoccult testing - after age 58  4. Labs managed by PCP  5. Counseling for contraception: no method  The pregnancy intention screening data noted above was reviewed. Potential methods of contraception were discussed. The patient elected to proceed with Pregnant/Seeking Pregnancy.     F/U  Return in about 1 year (around 03/20/2021) for Annual.  Annamarie Major, MD, [facility], [facility] 03/20/2020  1:45 PM

## 2020-03-25 LAB — CYTOLOGY - PAP: Diagnosis: NEGATIVE

## 2020-04-30 ENCOUNTER — Ambulatory Visit: Payer: BC Managed Care – PPO | Admitting: Allergy and Immunology

## 2020-06-02 ENCOUNTER — Other Ambulatory Visit: Payer: Self-pay

## 2020-06-02 ENCOUNTER — Ambulatory Visit
Admission: RE | Admit: 2020-06-02 | Discharge: 2020-06-02 | Disposition: A | Discharge status: Home / Self Care | Payer: Self-pay | Source: Ambulatory Visit

## 2020-06-02 VITALS — BP 134/95 | HR 104 | Temp 99.5°F | Resp 18 | Ht 60.0 in | Wt 165.0 lb

## 2020-06-02 DIAGNOSIS — B349 Viral infection, unspecified: Secondary | ICD-10-CM

## 2020-06-02 MED ORDER — ONDANSETRON 4 MG PO TBDP: 4.0000 mg | ORAL_TABLET | Freq: Three times a day (TID) | ORAL | 0 refills | Status: DC | PRN

## 2020-06-02 NOTE — ED Triage Notes (Signed)
Pt states she awoke with HA at approx 0330, followed by n/v/d. Pt states HA has improved, last emesis at 0600. But diarrhea and nausea continue.  Denies recent sore throat, congestion, runny nose, ear pain, dysuria sx. Pt states h/o migraines and attributes HA to migraine in nature. Took Excedrin migraine at 0430 and reports improvement of symptoms. Here for eval and tx of diarrhea and nausea.

## 2020-06-02 NOTE — Discharge Instructions (Addendum)
This is most likely viral Zofran as needed for nausea and vomiting  Bentyl as needed Stay hydrated.  Covid test pending.  Follow up as needed for continued or worsening symptoms

## 2020-06-02 NOTE — ED Provider Notes (Signed)
[name]    CSN: [id number] Arrival date & time: 06/02/20  1043      History   Chief Complaint Chief Complaint  Patient presents with  . Emesis    HPI Dominique Vega is a 30 y.o. female.   Patient is a 30 year old female past medical history of IBS and migraines.  She reports woke up this morning at approximately 3:00 with migraine followed by nausea, vomiting and diarrhea.  Had one episode of vomiting and 3-4 episodes of diarrhea since this morning.  Headache improved with Excedrin migraine.  Denies any associated sore throat, nasal congestion, rhinorrhea, cough, chest congestion, fevers.     History reviewed. No pertinent past medical history.  There are no problems to display for this patient.   History reviewed. No pertinent surgical history.  OB History    Gravida  3   Para      Term      Preterm      AB  3   Living        SAB  3   IAB      Ectopic      Multiple      Live Births               Home Medications    Prior to Admission medications   Medication Sig Start Date End Date Taking? Authorizing Provider  dicyclomine (BENTYL) 20 MG tablet Take by mouth.   Yes [provider]  escitalopram (LEXAPRO) 20 MG tablet Take 20 mg by mouth daily. 12/12/19  Yes [provider]  escitalopram (LEXAPRO) 20 MG tablet Take by mouth.     [provider]  ondansetron (ZOFRAN ODT) 4 MG disintegrating tablet Take 1 tablet (4 mg total) by mouth every 8 (eight) hours as needed for nausea or vomiting. 06/02/20   Janace Aris, NP    Family History Family History  Problem Relation Age of Onset  . Hypertension Mother   . Diabetes Father     Social History Social History   Tobacco Use  . Smoking status: Never Smoker  . Smokeless tobacco: Never Used  Substance Use Topics  . Alcohol use: Not Currently     Allergies   Penicillins   Review of Systems Review of Systems   Physical Exam Triage Vital  Signs ED Triage Vitals  Enc Vitals Group     BP 06/02/20 1058 (!) 134/95     Pulse Rate 06/02/20 1058 (!) 104     Resp 06/02/20 1058 18     Temp 06/02/20 1058 99.5 F (37.5 C)     Temp Source 06/02/20 1058 Oral     SpO2 06/02/20 1058 96 %     Weight 06/02/20 1056 165 lb (74.8 kg)     Height 06/02/20 1056 5' (1.524 m)     Head Circumference --      Peak Flow --      Pain Score 06/02/20 1054 1     Pain Loc --      Pain Edu? --      Excl. in GC? --    No data found.  Updated Vital Signs BP (!) 134/95 (BP Location: Left Arm)   Pulse (!) 104   Temp 99.5 F (37.5 C) (Oral)   Resp 18   Ht 5' (1.524 m)   Wt 165 lb (74.8 kg)   LMP 05/11/2020   SpO2 96%   BMI 32.22 kg/m   Visual Acuity Right  Eye Distance:   Left Eye Distance:   Bilateral Distance:    Right Eye Near:   Left Eye Near:    Bilateral Near:     Physical Exam Vitals and nursing note reviewed.  Constitutional:      General: She is not in acute distress.    Appearance: Normal appearance. She is not ill-appearing, toxic-appearing or diaphoretic.  HENT:     Head: Normocephalic.     Nose: Nose normal.  Eyes:     Conjunctiva/sclera: Conjunctivae normal.  Pulmonary:     Effort: Pulmonary effort is normal.  Musculoskeletal:        General: Normal range of motion.     Cervical back: Normal range of motion.  Skin:    General: Skin is warm and dry.     Findings: No rash.  Neurological:     Mental Status: She is alert.  Psychiatric:        Mood and Affect: Mood normal.      UC Treatments / Results  Labs (all labs ordered are listed, but only abnormal results are displayed) Labs Reviewed  NOVEL CORONAVIRUS, NAA    EKG   Radiology No results found.  Procedures Procedures (including critical care time)  Medications Ordered in UC Medications - No data to display  Initial Impression / Assessment and Plan / UC Course  I have reviewed the triage vital signs and the nursing notes.  Pertinent labs  & imaging results that were available during my care of the patient were reviewed by me and considered in my medical decision making (see chart for details).     Viral illness Covid test pending. Prescribed Zofran for nausea and vomiting and she already has Bentyl to use  as needed in case this is IBS related.  Recommend stay hydrated Follow up as needed for continued or worsening symptoms  Final Clinical Impressions(s) / UC Diagnoses   Final diagnoses:  Viral illness     Discharge Instructions     This is most likely viral Zofran as needed for nausea and vomiting  Bentyl as needed Stay hydrated.  Covid test pending.  Follow up as needed for continued or worsening symptoms     ED Prescriptions    Medication Sig Dispense Auth. Provider   ondansetron (ZOFRAN ODT) 4 MG disintegrating tablet Take 1 tablet (4 mg total) by mouth every 8 (eight) hours as needed for nausea or vomiting. 12 tablet Shantee Hayne A, NP     PDMP not reviewed this encounter.   Janace Aris, NP 06/02/20 1118

## 2020-06-04 LAB — SARS-COV-2, NAA 2 DAY TAT

## 2020-06-04 LAB — NOVEL CORONAVIRUS, NAA: SARS-CoV-2, NAA: NOT DETECTED

## 2020-06-19 ENCOUNTER — Ambulatory Visit
Admission: RE | Admit: 2020-06-19 | Discharge: 2020-06-19 | Disposition: A | Discharge status: Home / Self Care | Payer: BC Managed Care – PPO | Source: Ambulatory Visit | Attending: Emergency Medicine | Admitting: Emergency Medicine

## 2020-06-19 ENCOUNTER — Other Ambulatory Visit: Payer: Self-pay

## 2020-06-19 VITALS — BP 124/85 | HR 106 | Temp 98.3°F | Resp 18

## 2020-06-19 DIAGNOSIS — K580 Irritable bowel syndrome with diarrhea: Secondary | ICD-10-CM | POA: Diagnosis not present

## 2020-06-19 HISTORY — DX: Irritable bowel syndrome, unspecified: K58.9

## 2020-06-19 MED ORDER — DICYCLOMINE HCL 20 MG PO TABS: 20.0000 mg | ORAL_TABLET | Freq: Three times a day (TID) | ORAL | 0 refills | Status: DC

## 2020-06-19 MED ORDER — RIFAXIMIN 550 MG PO TABS: 550.0000 mg | ORAL_TABLET | Freq: Three times a day (TID) | ORAL | 0 refills | Status: AC

## 2020-06-19 NOTE — ED Provider Notes (Signed)
Dominique Vega    CSN: [id number] Arrival date & time: 06/19/20  1142      History   Chief Complaint Chief Complaint  Patient presents with  . Abdominal Pain    diarrhea    HPI Dominique Vega is a 30 y.o. female.   Dominique Vega is a 30 year old female with complaints of bloating, diarrhea, and cramping that started yesterday.  Patient reports symptoms consistent with previous IBS flares.  Patient has taken Bentyl and Xifaxan in the past for symptoms management, reports Xifaxan has worked better, but has not taken it in about a year.  Patient denies any blood in stool, nausea, vomiting, or fevers.   The history is provided by the patient.  Abdominal Pain Associated symptoms: diarrhea   Associated symptoms: no constipation, no fever, no nausea and no vomiting     Past Medical History:  Diagnosis Date  . Irritable bowel syndrome     There are no problems to display for this patient.   History reviewed. No pertinent surgical history.  OB History    Gravida  3   Para      Term      Preterm      AB  3   Living        SAB  3   IAB      Ectopic      Multiple      Live Births               Home Medications    Prior to Admission medications   Medication Sig Start Date End Date Taking? Authorizing Provider  escitalopram (LEXAPRO) 20 MG tablet Take 20 mg by mouth daily. 12/12/19  Yes [provider]  rifaximin (XIFAXAN) 550 MG TABS tablet Take 1 tablet (550 mg total) by mouth 3 (three) times daily for 14 days. 06/19/20 07/03/20 Yes Ivette Loyal, NP  dicyclomine (BENTYL) 20 MG tablet Take 1 tablet (20 mg total) by mouth 3 (three) times daily before meals. 06/19/20   Ivette Loyal, NP  escitalopram (LEXAPRO) 20 MG tablet Take by mouth.     [provider]  ondansetron (ZOFRAN ODT) 4 MG disintegrating tablet Take 1 tablet (4 mg total) by mouth every 8 (eight) hours as needed for nausea or vomiting. 06/02/20   Janace Aris, NP     Family History Family History  Problem Relation Age of Onset  . Hypertension Mother   . Diabetes Father     Social History Social History   Tobacco Use  . Smoking status: Never Smoker  . Smokeless tobacco: Never Used  Substance Use Topics  . Alcohol use: Not Currently     Allergies   Penicillins   Review of Systems Review of Systems  Constitutional: Negative for fever.  Gastrointestinal: Positive for abdominal pain and diarrhea. Negative for blood in stool, constipation, nausea and vomiting.     Physical Exam Triage Vital Signs ED Triage Vitals [06/19/20 1203]  Enc Vitals Group     BP 124/85     Pulse Rate (!) 106     Resp 18     Temp 98.3 F (36.8 C)     Temp Source Oral     SpO2 97 %     Weight      Height      Head Circumference      Peak Flow      Pain Score      Pain Loc  Pain Edu?      Excl. in GC?    No data found.  Updated Vital Signs BP 124/85 (BP Location: Left Arm)   Pulse (!) 106   Temp 98.3 F (36.8 C) (Oral)   Resp 18   LMP 06/10/2020 (Exact Date)   SpO2 97%   Visual Acuity Right Eye Distance:   Left Eye Distance:   Bilateral Distance:    Right Eye Near:   Left Eye Near:    Bilateral Near:     Physical Exam Constitutional:      General: She is not in acute distress.    Appearance: She is not ill-appearing or diaphoretic.  HENT:     Head: Normocephalic and atraumatic.  Cardiovascular:     Heart sounds: Normal heart sounds.  Pulmonary:     Effort: Pulmonary effort is normal.     Breath sounds: Normal breath sounds.  Abdominal:     General: Bowel sounds are normal. There is no distension.     Palpations: Abdomen is soft. There is no hepatomegaly or splenomegaly.     Tenderness: There is abdominal tenderness in the right upper quadrant and left upper quadrant. There is no right CVA tenderness, left CVA tenderness, guarding or rebound.     Hernia: No hernia is present.  Skin:    General: Skin is warm and dry.   Neurological:     Mental Status: She is alert.      UC Treatments / Results  Labs (all labs ordered are listed, but only abnormal results are displayed) Labs Reviewed - No data to display  EKG   Radiology No results found.  Procedures Procedures (including critical care time)  Medications Ordered in UC Medications - No data to display  Initial Impression / Assessment and Plan / UC Course  I have reviewed the triage vital signs and the nursing notes.  Pertinent labs & imaging results that were available during my care of the patient were reviewed by me and considered in my medical decision making (see chart for details).     Irritable Bowel Syndrome.  Assessment negative for red flags, including diverticulitis or gastroenteritis.  Encourage fluids and BRAT or bland food diet.   She has used Bentyl and Xifaxan in the past, reports better symptom control with Xifaxan. Prescription for bentyl and xifaxan given.    Final Clinical Impressions(s) / UC Diagnoses   Final diagnoses:  Irritable bowel syndrome with diarrhea     Discharge Instructions     You are having an IBS flare.  Follow a BRAT/Bland diet and drink lots of fluids.    Take the Bentyl as needed and Xifaxan as prescribed.    Follow up with PCP/gastroenteritis as needed.   Follow up if symptoms worsen or do not improve in the next few days.     ED Prescriptions    Medication Sig Dispense Auth. Provider   dicyclomine (BENTYL) 20 MG tablet Take 1 tablet (20 mg total) by mouth 3 (three) times daily before meals. 30 tablet Ivette Loyal, NP   rifaximin (XIFAXAN) 550 MG TABS tablet Take 1 tablet (550 mg total) by mouth 3 (three) times daily for 14 days. 42 tablet Ivette Loyal, NP     PDMP not reviewed this encounter.   Ivette Loyal, NP 06/19/20 [phone]

## 2020-06-19 NOTE — ED Triage Notes (Signed)
Pt presents today with what she describes as "an IBS flare up". She was unable to get in to see PCP. She c/o diarrhea, abd pain and bloating since 4am yesterday morning. She requests refills of IBS meds.

## 2020-06-19 NOTE — Discharge Instructions (Addendum)
You are having an IBS flare.  Follow a BRAT/Bland diet and drink lots of fluids.    Take the Bentyl as needed and Xifaxan as prescribed.    Follow up with PCP/gastroenteritis as needed.   Follow up if symptoms worsen or do not improve in the next few days.

## 2020-06-24 ENCOUNTER — Ambulatory Visit
Admission: RE | Admit: 2020-06-24 | Discharge: 2020-06-24 | Disposition: A | Discharge status: Home / Self Care | Payer: BC Managed Care – PPO | Source: Ambulatory Visit | Attending: Family Medicine | Admitting: Family Medicine

## 2020-06-24 ENCOUNTER — Other Ambulatory Visit: Payer: Self-pay

## 2020-06-24 VITALS — BP 118/88 | HR 99 | Temp 99.0°F | Resp 16

## 2020-06-24 DIAGNOSIS — B349 Viral infection, unspecified: Secondary | ICD-10-CM | POA: Diagnosis not present

## 2020-06-24 MED ORDER — ONDANSETRON 4 MG PO TBDP
4.0000 mg | ORAL_TABLET | Freq: Once | ORAL | Status: AC
  Administered 2020-06-24: 4 mg via ORAL

## 2020-06-24 NOTE — Discharge Instructions (Signed)
You most likely have a viral stomach illness.   Use zofran as needed for nausea.  Drink lots of fluids and stick to a BRAT/bland food diet.  Take Tylenol and/or Ibuprofen for pain relief and fever reduction.   Return or go to the Emergency Department if symptoms worsen or do not improve in the next few days.

## 2020-06-24 NOTE — ED Triage Notes (Signed)
Pt c/o abdominal pain, diarrhea last week that pt attributed to her IBS, but fever, n/v, body aches onset yesterday. Tmax 100.8 this morning. Last emesis at 0830 this morning.  Took 400mg  ibuprofen at approx 0900 this morning.  Denies sore throat, congestion, runny nose. States she was exposed to two people at work that had "stomach virus" this past week.

## 2020-06-24 NOTE — ED Provider Notes (Signed)
Dominique Vega    CSN: [id number] Arrival date & time: 06/24/20  1043      History   Chief Complaint Chief Complaint  Patient presents with  . Fever  . Emesis    HPI Dominique Vega is a 30 y.o. female.   Patient is a 30 year old female with complaint of abdominal pain and vomiting for the past 2 days.  Patient reports being in contact with coworkers who had "stomach bug."  Patient reports having fever that was relieved by ibuprofen.  Denies any congestion, cough, or sore throat.   The history is provided by the patient.  Fever Associated symptoms: nausea and vomiting   Associated symptoms: no congestion, no diarrhea and no sore throat   Emesis Associated symptoms: fever   Associated symptoms: no diarrhea and no sore throat     Past Medical History:  Diagnosis Date  . Irritable bowel syndrome     There are no problems to display for this patient.   History reviewed. No pertinent surgical history.  OB History    Gravida  3   Para      Term      Preterm      AB  3   Living        SAB  3   IAB      Ectopic      Multiple      Live Births               Home Medications    Prior to Admission medications   Medication Sig Start Date End Date Taking? Authorizing Provider  dicyclomine (BENTYL) 20 MG tablet Take 1 tablet (20 mg total) by mouth 3 (three) times daily before meals. 06/19/20  Yes Ivette Loyal, NP  escitalopram (LEXAPRO) 20 MG tablet Take 20 mg by mouth daily. 12/12/19  Yes [provider]  rifaximin (XIFAXAN) 550 MG TABS tablet Take 1 tablet (550 mg total) by mouth 3 (three) times daily for 14 days. 06/19/20 07/03/20 Yes Ivette Loyal, NP  escitalopram (LEXAPRO) 20 MG tablet Take by mouth.     [provider]  ondansetron (ZOFRAN ODT) 4 MG disintegrating tablet Take 1 tablet (4 mg total) by mouth every 8 (eight) hours as needed for nausea or vomiting. 06/02/20   Janace Aris, NP    Family History Family History   Problem Relation Age of Onset  . Hypertension Mother   . Diabetes Father     Social History Social History   Tobacco Use  . Smoking status: Never Smoker  . Smokeless tobacco: Never Used  Substance Use Topics  . Alcohol use: Not Currently     Allergies   Penicillins   Review of Systems Review of Systems  Constitutional: Positive for fever.  HENT: Negative for congestion, postnasal drip, sinus pressure, sinus pain and sore throat.   Gastrointestinal: Positive for nausea and vomiting. Negative for abdominal distention, blood in stool and diarrhea.     Physical Exam Triage Vital Signs ED Triage Vitals  Enc Vitals Group     BP 06/24/20 1102 118/88     Pulse Rate 06/24/20 1102 99     Resp 06/24/20 1102 16     Temp 06/24/20 1102 99 F (37.2 C)     Temp Source 06/24/20 1102 Oral     SpO2 06/24/20 1102 97 %     Weight --      Height --  Head Circumference --      Peak Flow --      Pain Score 06/24/20 1054 2     Pain Loc --      Pain Edu? --      Excl. in GC? --    No data found.  Updated Vital Signs BP 118/88 (BP Location: Left Arm)   Pulse 99   Temp 99 F (37.2 C) (Oral)   Resp 16   LMP 06/10/2020 (Exact Date)   SpO2 97%   Visual Acuity Right Eye Distance:   Left Eye Distance:   Bilateral Distance:    Right Eye Near:   Left Eye Near:    Bilateral Near:     Physical Exam Vitals and nursing note reviewed.  Constitutional:      General: She is not in acute distress.    Appearance: She is not ill-appearing or diaphoretic.  HENT:     Head: Normocephalic and atraumatic.  Eyes:     Conjunctiva/sclera: Conjunctivae normal.  Cardiovascular:     Pulses: Normal pulses.     Heart sounds: Normal heart sounds.  Pulmonary:     Effort: Pulmonary effort is normal.     Breath sounds: Normal breath sounds.  Abdominal:     General: Abdomen is flat. There is no distension.     Palpations: Abdomen is soft.     Tenderness: There is abdominal tenderness in  the epigastric area and periumbilical area. There is no guarding or rebound. Negative signs include Murphy's sign, Rovsing's sign, McBurney's sign, psoas sign and obturator sign.  Musculoskeletal:        General: Normal range of motion.  Skin:    General: Skin is warm.  Neurological:     Mental Status: She is alert.  Psychiatric:        Mood and Affect: Mood normal.      UC Treatments / Results  Labs (all labs ordered are listed, but only abnormal results are displayed) Labs Reviewed  COVID-19, FLU A+B NAA    EKG   Radiology No results found.  Procedures Procedures (including critical care time)  Medications Ordered in UC Medications  ondansetron (ZOFRAN-ODT) disintegrating tablet 4 mg (4 mg Oral Given 06/24/20 1105)    Initial Impression / Assessment and Plan / UC Course  I have reviewed the triage vital signs and the nursing notes.  Pertinent labs & imaging results that were available during my care of the patient were reviewed by me and considered in my medical decision making (see chart for details).     Viral Illness.   COVID test pending. Assessment negative for red flags or concerns including diverticulitis or appendicitis.   Patient has prescription for zofran, may take as needed.   Encourage fluid intake.  Brat/Bland food diet.  Tylenol/Ibuprofen for fever reduction and pain relief.   Final Clinical Impressions(s) / UC Diagnoses   Final diagnoses:  Viral illness     Discharge Instructions     You most likely have a viral stomach illness.   Use zofran as needed for nausea.  Drink lots of fluids and stick to a BRAT/bland food diet.  Take Tylenol and/or Ibuprofen for pain relief and fever reduction.   Return or go to the Emergency Department if symptoms worsen or do not improve in the next few days.      ED Prescriptions    None     PDMP not reviewed this encounter.   Ivette Loyal, NP 06/24/20 1124

## 2020-06-26 LAB — COVID-19, FLU A+B NAA
Influenza A, NAA: NOT DETECTED
Influenza B, NAA: NOT DETECTED
SARS-CoV-2, NAA: NOT DETECTED

## 2020-06-27 ENCOUNTER — Telehealth: Payer: BC Managed Care – PPO | Admitting: Family Medicine

## 2020-06-27 ENCOUNTER — Encounter: Payer: Self-pay | Admitting: Family Medicine

## 2020-06-27 DIAGNOSIS — R112 Nausea with vomiting, unspecified: Secondary | ICD-10-CM

## 2020-06-27 NOTE — Progress Notes (Signed)
[name], Dominique Vega are scheduled for a virtual visit with your provider today.    Just as we do with appointments in the office, we must obtain your consent to participate.  Your consent will be active for this visit and any virtual visit you may have with one of our providers in the next 365 days.    If you have a MyChart account, I can also send a copy of this consent to you electronically.  All virtual visits are billed to your insurance company just like a traditional visit in the office.  As this is a virtual visit, video technology does not allow for your provider to perform a traditional examination.  This may limit your provider's ability to fully assess your condition.  If your provider identifies any concerns that need to be evaluated in person or the need to arrange testing such as labs, EKG, etc, we will make arrangements to do so.    Although advances in technology are sophisticated, we cannot ensure that it will always work on either your end or our end.  If the connection with a video visit is poor, we may have to switch to a telephone visit.  With either a video or telephone visit, we are not always able to ensure that we have a secure connection.   I need to obtain your verbal consent now.   Are you willing to proceed with your visit today?   Dominique Vega has provided verbal consent on 06/27/2020 for a virtual visit (video or telephone). Virtual Visit via Video Note  I connected with Dominique Vega on 06/27/20 at  1:45 PM EST by a video enabled telemedicine application and verified that I am speaking with the correct person using two identifiers.  Location: Patient: Home Provider: [place] Patient [facility]   I discussed the limitations of evaluation and management by telemedicine and the availability of in person appointments. The patient expressed understanding and agreed to proceed.  History of Present Illness:   Dominique Vega is a 30 year old female that presents via video with  complaints of, nausea, vomiting and headache over the past 3 days.  Patient states that 3 days ago she began to experience vomiting of stomach contents and nausea.  She reports that her coworker had similar symptoms.  She was tested for COVID-19 on 06/24/2020, which was negative.  Patient was also seen in urgent care and was given a prescription of Zofran.  Patient has been taking Zofran consistently for nausea with some relief and vomiting has subsided.  She continues to endorse a low-grade fever and headache.  Patient states that she is feeling very tired and is unable to return to work at this time.  She denies any blurred vision, dizziness, shortness of breath, cough, urinary symptoms, or diarrhea.  Emesis  This is a new problem. The current episode started in the past 7 days. The problem has been gradually improving. The emesis has an appearance of stomach contents. The maximum temperature recorded prior to her arrival was 100.4 - 100.9 F. The fever has been present for 1 to 2 days. Associated symptoms include a fever and headaches. Pertinent negatives include no arthralgias, chest pain, chills, sweats or URI. Risk factors include ill contacts. She has tried diet change for the symptoms. The treatment provided mild relief.   Past Medical History:  Diagnosis Date  . Irritable bowel syndrome    Social History   Socioeconomic History  . Marital status: Married    Spouse name:  Not on file  . Number of children: Not on file  . Years of education: Not on file  . Highest education level: Not on file  Occupational History  . Not on file  Tobacco Use  . Smoking status: Never Smoker  . Smokeless tobacco: Never Used  Substance and Sexual Activity  . Alcohol use: Not Currently  . Drug use: Not on file  . Sexual activity: Yes    Birth control/protection: None  Other Topics Concern  . Not on file  Social History Narrative  . Not on file   Social Determinants of Health   Financial Resource  Strain: Not on file  Food Insecurity: Not on file  Transportation Needs: Not on file  Physical Activity: Not on file  Stress: Not on file  Social Connections: Not on file  Intimate Partner Violence: Not on file   Allergies  Allergen Reactions  . Penicillins Diarrhea and Nausea And Vomiting   Immunization History  Administered Date(s) Administered  . PFIZER(Purple Top)SARS-COV-2 Vaccination 09/01/2019, 10/01/2019    Assessment and Plan: Non-intractable vomiting with nausea, unspecified vomiting type Continue Zofran as previously prescribed. Hydrate consistently with electrolyte infused fluids like propel water, Gatorade, and/or Powerade. Advance diet as tolerated. Patient requesting note to return to work, note will be available via MyChart  Follow Up Instructions:    I discussed the assessment and treatment plan with the patient. The patient was provided an opportunity to ask questions and all were answered. The patient agreed with the plan and demonstrated an understanding of the instructions.   The patient was advised to call back or seek an in-person evaluation if the symptoms worsen or if the condition fails to improve as anticipated.  I provided 5 minutes of non-face-to-face time during this encounter.  Nolon Nations  APRN, MSN, FNP-C Patient Care [facility] [address] [place], [place] [address] [phone]    Julianne Handler, FNP 06/27/2020  1:38 PM

## 2020-06-27 NOTE — Patient Instructions (Signed)
Nausea and Vomiting, Adult Nausea is feeling sick to your stomach or feeling that you are about to throw up (vomit). Vomiting is when food in your stomach is thrown up and out of the mouth. Throwing up can make you feel weak. It can also make you lose too much water in your body (get dehydrated). If you lose too much water in your body, you may:  Feel tired.  Feel thirsty.  Have a dry mouth.  Have cracked lips.  Go pee (urinate) less often. Older adults and people with other diseases or a weak body defense system (immune system) are at higher risk for losing too much water in the body. If you feel sick to your stomach and you throw up, it is important to follow instructions from your doctor about how to take care of yourself. Follow these instructions at home: Watch your symptoms for any changes. Tell your doctor about them. Follow these instructions to care for yourself at home. Eating and drinking  Take an ORS (oral rehydration solution). This is a drink that is sold at pharmacies and stores.  Drink clear fluids in small amounts as you are able, such as: ? Water. ? Ice chips. ? Fruit juice that has water added (diluted fruit juice). ? Low-calorie sports drinks.  Eat bland, easy-to-digest foods in small amounts as you are able, such as: ? Bananas. ? Applesauce. ? Rice. ? Low-fat (lean) meats. ? Toast. ? Crackers.  Avoid drinking fluids that have a lot of sugar or caffeine in them. This includes energy drinks, sports drinks, and soda.  Avoid alcohol.  Avoid spicy or fatty foods.      General instructions  Take over-the-counter and prescription medicines only as told by your doctor.  Drink enough fluid to keep your pee (urine) pale yellow.  Wash your hands often with soap and water. If you cannot use soap and water, use hand sanitizer.  Make sure that all people in your home wash their hands well and often.  Rest at home while you get better.  Watch your condition  for any changes.  Take slow and deep breaths when you feel sick to your stomach.  Keep all follow-up visits as told by your doctor. This is important. Contact a doctor if:  Your symptoms get worse.  You have new symptoms.  You have a fever.  You cannot drink fluids without throwing up.  You feel sick to your stomach for more than 2 days.  You feel light-headed or dizzy.  You have a headache.  You have muscle cramps.  You have a rash.  You have pain while peeing. Get help right away if:  You have pain in your chest, neck, arm, or jaw.  You feel very weak or you pass out (faint).  You throw up again and again.  You have throw up that is bright red or looks like black coffee grounds.  You have bloody or black poop (stools) or poop that looks like tar.  You have a very bad headache, a stiff neck, or both.  You have very bad pain, cramping, or bloating in your belly (abdomen).  You have trouble breathing.  You are breathing very quickly.  Your heart is beating very quickly.  Your skin feels cold and clammy.  You feel confused.  You have signs of losing too much water in your body, such as: ? Dark pee, very little pee, or no pee. ? Cracked lips. ? Dry mouth. ? Sunken eyes. ?   Sleepiness. ? Weakness. These symptoms may be an emergency. Do not wait to see if the symptoms will go away. Get medical help right away. Call your local emergency services (911 in the [place] Do not drive yourself to the hospital. Summary  Nausea is feeling sick to your stomach or feeling that you are about to throw up (vomit). Vomiting is when food in your stomach is thrown up and out of the mouth.  Follow instructions from your doctor about eating and drinking to keep from losing too much water in your body.  Take over-the-counter and prescription medicines only as told by your doctor.  Contact your doctor if your symptoms get worse or you have new symptoms.  Keep all follow-up  visits as told by your doctor. This is important. This information is not intended to replace advice given to you by your health care provider. Make sure you discuss any questions you have with your health care provider. Document Revised: 08/11/2018 Document Reviewed: 09/27/2017 Elsevier Patient Education  2021 [employer]  

## 2020-07-21 ENCOUNTER — Encounter: Payer: Self-pay | Admitting: *Deleted

## 2020-08-05 ENCOUNTER — Other Ambulatory Visit: Payer: Self-pay

## 2020-08-06 ENCOUNTER — Encounter: Payer: Self-pay | Admitting: Gastroenterology

## 2020-08-06 ENCOUNTER — Ambulatory Visit: Payer: BC Managed Care – PPO | Admitting: Gastroenterology

## 2020-08-06 ENCOUNTER — Other Ambulatory Visit: Payer: Self-pay

## 2020-08-06 VITALS — BP 145/88 | HR 105 | Temp 98.1°F | Ht 60.0 in | Wt 176.5 lb

## 2020-08-06 DIAGNOSIS — R197 Diarrhea, unspecified: Secondary | ICD-10-CM

## 2020-08-06 DIAGNOSIS — R140 Abdominal distension (gaseous): Secondary | ICD-10-CM

## 2020-08-06 DIAGNOSIS — K529 Noninfective gastroenteritis and colitis, unspecified: Secondary | ICD-10-CM

## 2020-08-06 MED ORDER — CLENPIQ 10-3.5-12 MG-GM -GM/160ML PO SOLN: 320.0000 mL | Freq: Once | ORAL | 0 refills | Status: AC

## 2020-08-06 NOTE — Patient Instructions (Signed)
Colonoscopy DX  Diarrhea R19.7 CPT 45378 EGD  EGD Abdominal bloating R14.0 CPT 43235

## 2020-08-06 NOTE — Progress Notes (Signed)
Arlyss Repress, MD [address]  [address]  [place], [place] [address]  Main: [phone]  Fax: [phone]    Gastroenterology Consultation  Referring Provider:     Ronal Fear, NP Primary Care Physician:  Practice, [facility] Primary Gastroenterologist:  Dr. Arlyss Repress Reason for Consultation:     Chronic intermittent diarrhea, abdominal bloating        HPI:   Dominique Vega is a 30 y.o. female referred by Dr. Lindalou Hose, [facility]  for consultation & management of chronic intermittent diarrhea and abdominal bloating.  Patient reports that she has been experiencing chronic symptoms of alternating episodes of constipation and nonbloody diarrhea associated with abdominal bloating and cramps.  The symptoms have been ongoing for several years, she states as far as she remembers.  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  Her episodes of constipation last for 1 week without a BM followed by nonbloody diarrheal episodes when she uses laxative.  She is put on Bentyl 20 mg and she takes during episodes of diarrhea which leads to constipation.  Her weight has been stable.  Her labs including CBC, CMP are unremarkable.  She does not smoke or drink alcohol  NSAIDs: None  Antiplts/Anticoagulants/Anti thrombotics: None  GI Procedures: None 2 first cousins with Crohn's disease  Past Medical History:  Diagnosis Date  . Irritable bowel syndrome     History reviewed. No pertinent surgical history.  Current Outpatient Medications:  .  dicyclomine (BENTYL) 20 MG tablet, Take 1 tablet (20 mg total) by mouth 3 (three) times daily before meals., Disp: 30 tablet, Rfl: 0 .  escitalopram (LEXAPRO) 20 MG tablet, Take 20 mg by mouth daily., Disp: , Rfl:  .  ondansetron (ZOFRAN ODT) 4 MG disintegrating tablet, Take 1 tablet (4 mg total) by mouth every 8 (eight) hours as needed for nausea or  vomiting., Disp: 12 tablet, Rfl: 0 .  Prenatal Vit-Fe Fumarate-FA (PNV PRENATAL PLUS MULTIVITAMIN) 27-1 MG TABS, Take 1 tablet by mouth daily., Disp: , Rfl:  .  Sod Picosulfate-Mag Ox-Cit Acd (CLENPIQ) 10-3.5-12 MG-GM -GM/160ML SOLN, Take 320 mLs by mouth once for 1 dose., Disp: 320 mL, Rfl: 0   Family History  Problem Relation Age of Onset  . Hypertension Mother   . Diabetes Father      Social History   Tobacco Use  . Smoking status: Never Smoker  . Smokeless tobacco: Never Used  Substance Use Topics  . Alcohol use: Not Currently    Allergies as of 08/06/2020 - Review Complete 08/06/2020  Allergen Reaction Noted  . Penicillins Diarrhea and Nausea And Vomiting 11/11/2017    Review of Systems:    All systems reviewed and negative except where noted in HPI.   Physical Exam:  BP (!) 145/88 (BP Location: Left Arm, Patient Position: Sitting, Cuff Size: Normal)   Pulse (!) 105   Temp 98.1 F (36.7 C) (Oral)   Ht 5' (1.524 m)   Wt 176 lb 8 oz (80.1 kg)   BMI 34.47 kg/m  No LMP recorded.  General:   Alert,  Well-developed, well-nourished, pleasant and cooperative in NAD Head:  Normocephalic and atraumatic. Eyes:  Sclera clear, no icterus.   Conjunctiva pink. Ears:  Normal auditory acuity. Nose:  No deformity, discharge, or lesions. Mouth:  No deformity or lesions,oropharynx pink & moist. Neck:  Supple; no masses or thyromegaly. Lungs:  Respirations  even and unlabored.  Clear throughout to auscultation.   No wheezes, crackles, or rhonchi. No acute distress. Heart:  Regular rate and rhythm; no murmurs, clicks, rubs, or gallops. Abdomen:  Normal bowel sounds. Soft, non-tender and mildly distended, tympanic without masses, hepatosplenomegaly or hernias noted.  No guarding or rebound tenderness.   Rectal: Not performed Msk:  Symmetrical without gross deformities. Good, equal movement & strength bilaterally. Pulses:  Normal pulses noted. Extremities:  No clubbing or edema.  No  cyanosis. Neurologic:  Alert and oriented x3;  grossly normal neurologically. Skin:  Intact without significant lesions or rashes. No jaundice. Lymph Nodes:  No significant cervical adenopathy. Psych:  Alert and cooperative. Normal mood and affect.  Imaging Studies: Reviewed  Assessment and Plan:   Dominique Vega is a 30 y.o. Caucasian female with history of miscarriages, family history of Crohn's disease is seen in consultation for chronic symptoms of alternating episodes of constipation and diarrhea associated with abdominal cramps and bloating.  I have discussed regarding avoidance of laxatives and intake of MiraLAX during episodes of constipation to keep her bowels regular.  Decrease Bentyl to 10 mg daily as needed during episodes of diarrhea.  Avoid carbonated beverages, incorporate regular exercise, 3 to 5 days a week.  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

## 2020-08-08 ENCOUNTER — Other Ambulatory Visit: Payer: Self-pay

## 2020-08-08 ENCOUNTER — Encounter: Payer: Self-pay | Admitting: Advanced Practice Midwife

## 2020-08-08 ENCOUNTER — Ambulatory Visit: Payer: BC Managed Care – PPO | Admitting: Advanced Practice Midwife

## 2020-08-08 VITALS — BP 120/80 | Ht 60.0 in | Wt 176.0 lb

## 2020-08-08 DIAGNOSIS — R350 Frequency of micturition: Secondary | ICD-10-CM

## 2020-08-08 DIAGNOSIS — R102 Pelvic and perineal pain: Secondary | ICD-10-CM

## 2020-08-08 DIAGNOSIS — R3915 Urgency of urination: Secondary | ICD-10-CM

## 2020-08-08 DIAGNOSIS — R300 Dysuria: Secondary | ICD-10-CM | POA: Diagnosis not present

## 2020-08-08 MED ORDER — CEPHALEXIN 500 MG PO CAPS: 500.0000 mg | ORAL_CAPSULE | Freq: Three times a day (TID) | ORAL | 0 refills | Status: DC

## 2020-08-10 LAB — URINE CULTURE

## 2020-08-11 ENCOUNTER — Other Ambulatory Visit: Payer: BC Managed Care – PPO

## 2020-08-11 ENCOUNTER — Encounter: Payer: Self-pay | Admitting: Advanced Practice Midwife

## 2020-08-11 NOTE — Progress Notes (Signed)
Patient ID: Notnamed Dominique Vega, female   DOB: 08-29-1990, 30 y.o.   MRN: [id number]  Reason for Consult: Urinary Tract Infection    Subjective:  Date of Service: 08/08/2020  HPI:  Dominique Vega is a 30 y.o. female being seen for UTI symptoms. She has a history of recurrent UTI and also pyelonephritis. Her symptoms today are burning, urgency, frequency, lower abdominal pain and some low back pain.  She is scheduled for a colonoscopy on 4/13 to determine if she has IBD vs IBS. She wonders if it is ok to take antibiotics prior to and during her preparation. We discussed the importance of treating a UTI vs waiting.   Past Medical History:  Diagnosis Date  . Irritable bowel syndrome    Family History  Problem Relation Age of Onset  . Hypertension Mother   . Diabetes Father    History reviewed. No pertinent surgical history.  Short Social History:  Social History   Tobacco Use  . Smoking status: Never Smoker  . Smokeless tobacco: Never Used  Substance Use Topics  . Alcohol use: Not Currently    Allergies  Allergen Reactions  . Penicillins Diarrhea and Nausea And Vomiting    Current Outpatient Medications  Medication Sig Dispense Refill  . cephALEXin (KEFLEX) 500 MG capsule Take 1 capsule (500 mg total) by mouth 3 (three) times daily. 21 capsule 0  . escitalopram (LEXAPRO) 20 MG tablet Take 20 mg by mouth daily.    . Prenatal Vit-Fe Fumarate-FA (PNV PRENATAL PLUS MULTIVITAMIN) 27-1 MG TABS Take 1 tablet by mouth daily.    Marland Kitchen dicyclomine (BENTYL) 20 MG tablet Take 1 tablet (20 mg total) by mouth 3 (three) times daily before meals. (Patient not taking: Reported on 08/08/2020) 30 tablet 0  . ondansetron (ZOFRAN ODT) 4 MG disintegrating tablet Take 1 tablet (4 mg total) by mouth every 8 (eight) hours as needed for nausea or vomiting. (Patient not taking: Reported on 08/08/2020) 12 tablet 0   No current facility-administered medications for this visit.    Review of Systems   Constitutional: Negative for chills and fever.  HENT: Negative for congestion, ear discharge, ear pain, hearing loss, sinus pain and sore throat.   Eyes: Negative for blurred vision and double vision.  Respiratory: Negative for cough, shortness of breath and wheezing.   Cardiovascular: Negative for chest pain, palpitations and leg swelling.  Gastrointestinal: Positive for abdominal pain. Negative for blood in stool, constipation, diarrhea, heartburn, melena, nausea and vomiting.  Genitourinary: Positive for dysuria, frequency and urgency. Negative for flank pain and hematuria.  Musculoskeletal: Positive for back pain. Negative for joint pain and myalgias.  Skin: Negative for itching and rash.  Neurological: Negative for dizziness, tingling, tremors, sensory change, speech change, focal weakness, seizures, loss of consciousness, weakness and headaches.  Endo/Heme/Allergies: Negative for environmental allergies. Does not bruise/bleed easily.  Psychiatric/Behavioral: Negative for depression, hallucinations, memory loss, substance abuse and suicidal ideas. The patient is not nervous/anxious and does not have insomnia.         Objective:  Objective   Vitals:   08/08/20 1455  BP: 120/80  Weight: 176 lb (79.8 kg)  Height: 5' (1.524 m)   Body mass index is 34.37 kg/m. Constitutional: Well nourished, well developed female in no acute distress.  HEENT: normal Skin: Warm and dry.  Cardiovascular: Regular rate and rhythm.   Extremity: no edema Respiratory: Clear to auscultation bilateral. Normal respiratory effort Abdomen: soft, nontender, nondistended, no abnormal masses, no epigastric pain Back: no  CVAT, positive for low back pain Neuro: DTRs 2+, Cranial nerves grossly intact Psych: Alert and Oriented x3. No memory deficits. Normal mood and affect.  MS: normal gait, normal bilateral lower extremity ROM/strength/stability.   Data: Component 3 d ago  Urine Culture, Routine Final  reportAbnormal   Organism ID, Bacteria CommentAbnormal   Comment: Beta hemolytic Streptococcus, group B  10,000-25,000 colony forming units per mL  Penicillin and ampicillin are drugs of choice for treatment of  beta-hemolytic streptococcal infections. Susceptibility testing of  penicillins and other beta-lactam agents approved by the FDA for  treatment of beta-hemolytic streptococcal infections need not be  performed routinely because nonsusceptible isolates are extremely  rare in any beta-hemolytic streptococcus and have not been reported  for Streptococcus pyogenes (group A). (CLSI)   Resulting Agency [facility]        Narrative Performed by: Verdell Carmine Performed at: [address] [facility]  [address], [place], [place] [id number]  Lab Director: Jolene Schimke MD, Phone: [phone]    Specimen Collected: 08/08/20 15:35 Last Resulted: 08/10/20 07:35          Assessment/Plan:     30 y.o. G3 P54 female with likely UTI, will treat empirically Update to visit: 10-25,000 colony count GBS in urine  Rx Keflex sent at time of visit with follow up as needed Message sent to patient regarding usual treatment with penicillin (patient has mild allergy to penicillins)   Tresea Mall CNM [facility] 08/11/2020, 3:41 PM

## 2020-08-12 ENCOUNTER — Encounter: Payer: Self-pay | Admitting: Gastroenterology

## 2020-08-13 ENCOUNTER — Other Ambulatory Visit: Payer: Self-pay

## 2020-08-13 ENCOUNTER — Encounter
Admission: RE | Disposition: A | Discharge status: Home / Self Care | Payer: Self-pay | Source: Home / Self Care | Attending: Gastroenterology

## 2020-08-13 ENCOUNTER — Ambulatory Visit: Payer: BC Managed Care – PPO | Admitting: Anesthesiology

## 2020-08-13 ENCOUNTER — Encounter: Payer: Self-pay | Admitting: Gastroenterology

## 2020-08-13 ENCOUNTER — Ambulatory Visit
Admission: RE | Admit: 2020-08-13 | Discharge: 2020-08-13 | Disposition: A | Discharge status: Home / Self Care | Payer: BC Managed Care – PPO | Attending: Gastroenterology | Admitting: Gastroenterology

## 2020-08-13 DIAGNOSIS — Z79899 Other long term (current) drug therapy: Secondary | ICD-10-CM | POA: Insufficient documentation

## 2020-08-13 DIAGNOSIS — R140 Abdominal distension (gaseous): Secondary | ICD-10-CM

## 2020-08-13 DIAGNOSIS — K529 Noninfective gastroenteritis and colitis, unspecified: Secondary | ICD-10-CM | POA: Diagnosis not present

## 2020-08-13 DIAGNOSIS — R197 Diarrhea, unspecified: Secondary | ICD-10-CM | POA: Diagnosis not present

## 2020-08-13 DIAGNOSIS — K2100 Gastro-esophageal reflux disease with esophagitis, without bleeding: Secondary | ICD-10-CM | POA: Diagnosis not present

## 2020-08-13 DIAGNOSIS — Z8379 Family history of other diseases of the digestive system: Secondary | ICD-10-CM | POA: Insufficient documentation

## 2020-08-13 HISTORY — PX: ESOPHAGOGASTRODUODENOSCOPY (EGD) WITH PROPOFOL: SHX5813

## 2020-08-13 HISTORY — DX: Personal history of other complications of pregnancy, childbirth and the puerperium: Z87.59

## 2020-08-13 HISTORY — DX: Anxiety disorder, unspecified: F41.9

## 2020-08-13 HISTORY — PX: COLONOSCOPY WITH PROPOFOL: SHX5780

## 2020-08-13 LAB — POCT PREGNANCY, URINE
Preg Test, Ur: NEGATIVE
Preg Test, Ur: NEGATIVE

## 2020-08-13 SURGERY — COLONOSCOPY WITH PROPOFOL
Anesthesia: General

## 2020-08-13 MED ORDER — PROPOFOL 500 MG/50ML IV EMUL
INTRAVENOUS | Status: AC
  Filled 2020-08-13: qty 50

## 2020-08-13 MED ORDER — MIDAZOLAM HCL 2 MG/2ML IJ SOLN
INTRAMUSCULAR | Status: AC
  Filled 2020-08-13: qty 2

## 2020-08-13 MED ORDER — OMEPRAZOLE 40 MG PO CPDR: 40.0000 mg | DELAYED_RELEASE_CAPSULE | Freq: Every day | ORAL | 2 refills | Status: DC

## 2020-08-13 MED ORDER — GLYCOPYRROLATE 0.2 MG/ML IJ SOLN
INTRAMUSCULAR | Status: DC | PRN
  Administered 2020-08-13: .2 mg via INTRAVENOUS

## 2020-08-13 MED ORDER — PROPOFOL 500 MG/50ML IV EMUL
INTRAVENOUS | Status: DC | PRN
  Administered 2020-08-13: 125 ug/kg/min via INTRAVENOUS

## 2020-08-13 MED ORDER — DEXMEDETOMIDINE (PRECEDEX) IN NS 20 MCG/5ML (4 MCG/ML) IV SYRINGE
PREFILLED_SYRINGE | INTRAVENOUS | Status: DC | PRN
  Administered 2020-08-13 (×5): 4 ug via INTRAVENOUS

## 2020-08-13 MED ORDER — DEXMEDETOMIDINE (PRECEDEX) IN NS 20 MCG/5ML (4 MCG/ML) IV SYRINGE
PREFILLED_SYRINGE | INTRAVENOUS | Status: AC
  Filled 2020-08-13: qty 5

## 2020-08-13 MED ORDER — SODIUM CHLORIDE 0.9 % IV SOLN: INTRAVENOUS | Status: DC

## 2020-08-13 MED ORDER — LIDOCAINE HCL (CARDIAC) PF 100 MG/5ML IV SOSY
PREFILLED_SYRINGE | INTRAVENOUS | Status: DC | PRN
  Administered 2020-08-13: 50 mg via INTRAVENOUS

## 2020-08-13 MED ORDER — MIDAZOLAM HCL 2 MG/2ML IJ SOLN
INTRAMUSCULAR | Status: DC | PRN
  Administered 2020-08-13: 2 mg via INTRAVENOUS

## 2020-08-13 MED ORDER — PROPOFOL 10 MG/ML IV BOLUS
INTRAVENOUS | Status: DC | PRN
  Administered 2020-08-13 (×2): 20 mg via INTRAVENOUS
  Administered 2020-08-13: 50 mg via INTRAVENOUS

## 2020-08-13 NOTE — Anesthesia Procedure Notes (Signed)
Procedure Name: MAC Date/Time: 08/13/2020 10:22 AM Performed by: Jerrye Noble, CRNA Pre-anesthesia Checklist: Patient identified, Emergency Drugs available, Suction available and Patient being monitored Patient Re-evaluated:Patient Re-evaluated prior to induction

## 2020-08-13 NOTE — Op Note (Signed)
[facility] Patient Name: Dominique Vega Procedure Date: 08/13/2020 10:16 AM MRN: [id number] Account #: [id number] Date of Birth: 06/18/1990 Admit Type: Outpatient Age: 30 Room: [facility] ENDO ROOM 3 Gender: Female Note Status: Finalized Procedure:             Colonoscopy Indications:           Family history of Crohn's disease in a first-degree                         relative, Chronic diarrhea, Clinically significant                         diarrhea of unexplained origin Providers:             [name] MD, MD Referring MD:          Ronal Fear (Referring MD) Medicines:             General Anesthesia Complications:         No immediate complications. Estimated blood loss: None. Procedure:             Pre-Anesthesia Assessment:                        - Prior to the procedure, a History and Physical was                         performed, and patient medications and allergies were                         reviewed. The patient is competent. The risks and                         benefits of the procedure and the sedation options and                         risks were discussed with the patient. All questions                         were answered and informed consent was obtained.                         Patient identification and proposed procedure were                         verified by the physician, the nurse, the                         anesthesiologist, the anesthetist and the technician                         in the pre-procedure area in the procedure room in the                         endoscopy suite. Mental Status Examination: alert and                         oriented. Airway Examination: normal oropharyngeal  airway and neck mobility. Respiratory Examination:                         clear to auscultation. CV Examination: normal.                         Prophylactic Antibiotics: The patient does not require                          prophylactic antibiotics. Prior Anticoagulants: The                         patient has taken no previous anticoagulant or                         antiplatelet agents. ASA Grade Assessment: II - A                         patient with mild systemic disease. After reviewing                         the risks and benefits, the patient was deemed in                         satisfactory condition to undergo the procedure. The                         anesthesia plan was to use general anesthesia.                         Immediately prior to administration of medications,                         the patient was re-assessed for adequacy to receive                         sedatives. The heart rate, respiratory rate, oxygen                         saturations, blood pressure, adequacy of pulmonary                         ventilation, and response to care were monitored                         throughout the procedure. The physical status of the                         patient was re-assessed after the procedure.                        After obtaining informed consent, the colonoscope was                         passed under direct vision. Throughout the procedure,                         the patient's blood pressure, pulse, and oxygen  saturations were monitored continuously. The                         Colonoscope was introduced through the anus and                         advanced to the the terminal ileum, with                         identification of the appendiceal orifice and IC                         valve. The colonoscopy was performed without                         difficulty. The patient tolerated the procedure well.                         The quality of the bowel preparation was evaluated                         using the BBPS [facility] Bowel Preparation Scale) with                         scores of: Right Colon = 3, Transverse Colon = 3 and                          Left Colon = 3 (entire mucosa seen well with no                         residual staining, small fragments of stool or opaque                         liquid). The total BBPS score equals 9. Findings:      The perianal and digital rectal examinations were normal. Pertinent       negatives include normal sphincter tone and no palpable rectal lesions.      The terminal ileum appeared normal.      Normal mucosa was found in the entire colon. Biopsies were taken with a       cold forceps for histology.      The retroflexed view of the distal rectum and anal verge was normal and       showed no anal or rectal abnormalities. Impression:            - The examined portion of the ileum was normal.                        - Normal mucosa in the entire examined colon. Biopsied.                        - The distal rectum and anal verge are normal on                         retroflexion view. Recommendation:        - Discharge patient to home (with escort).                        -  Resume previous diet today.                        - Continue present medications.                        - Await pathology results.                        - Return to my office as previously scheduled. Procedure Code(s):     --- Professional ---                        [phone], Colonoscopy, flexible; with biopsy, single or                         multiple Diagnosis Code(s):     --- Professional ---                        Z83.79, Family history of other diseases of the                         digestive system                        K52.9, Noninfective gastroenteritis and colitis,                         unspecified                        R19.7, Diarrhea, unspecified CPT copyright 2019 American Medical Association. All rights reserved. The codes documented in this report are preliminary and upon coder review may  be revised to meet current compliance requirements. Dr. Libby Maw [name] MD,  MD 08/13/2020 10:45:04 AM This report has been signed electronically. Number of Addenda: 0 Note Initiated On: 08/13/2020 10:16 AM Scope Withdrawal Time: 0 hours 7 minutes 47 seconds  Total Procedure Duration: 0 hours 10 minutes 31 seconds  Estimated Blood Loss:  Estimated blood loss: none.      [facility]

## 2020-08-13 NOTE — Anesthesia Preprocedure Evaluation (Signed)
Anesthesia Evaluation  Patient identified by MRN, date of birth, ID band Patient awake    Reviewed: Allergy & Precautions, NPO status , Patient's Chart, lab work & pertinent test results  History of Anesthesia Complications Negative for: history of anesthetic complications  Airway Mallampati: II  TM Distance: >3 FB Neck ROM: Full    Dental no notable dental hx.    Pulmonary neg pulmonary ROS, neg sleep apnea, neg COPD,    breath sounds clear to auscultation- rhonchi (-) wheezing      Cardiovascular Exercise Tolerance: Good (-) hypertension(-) CAD, (-) Past MI, (-) Cardiac Stents and (-) CABG  Rhythm:Regular Rate:Normal - Systolic murmurs and - Diastolic murmurs    Neuro/Psych neg Seizures PSYCHIATRIC DISORDERS Anxiety negative neurological ROS     GI/Hepatic negative GI ROS, Neg liver ROS,   Endo/Other  negative endocrine ROSneg diabetes  Renal/GU negative Renal ROS     Musculoskeletal negative musculoskeletal ROS (+)   Abdominal (+) + obese,   Peds  Hematology negative hematology ROS (+)   Anesthesia Other Findings Past Medical History: No date: Anxiety No date: History of miscarriage No date: Irritable bowel syndrome   Reproductive/Obstetrics                             Anesthesia Physical Anesthesia Plan  ASA: II  Anesthesia Plan: General   Post-op Pain Management:    Induction: Intravenous  PONV Risk Score and Plan: 2 and Propofol infusion  Airway Management Planned: Natural Airway  Additional Equipment:   Intra-op Plan:   Post-operative Plan:   Informed Consent: I have reviewed the patients History and Physical, chart, labs and discussed the procedure including the risks, benefits and alternatives for the proposed anesthesia with the patient or authorized representative who has indicated his/her understanding and acceptance.     Dental advisory given  Plan  Discussed with: CRNA and Anesthesiologist  Anesthesia Plan Comments:         Anesthesia Quick Evaluation

## 2020-08-13 NOTE — H&P (Signed)
Arlyss Repress, MD [address]  [address]  [place], [place] [address]  Main: [phone]  Fax: [phone] Pager: [phone]  Primary Care Physician:  Practice, [facility] Primary Gastroenterologist:  Dr. Arlyss Repress  Pre-Procedure History & Physical: HPI:  Dominique Vega is a 30 y.o. female is here for an endoscopy and colonoscopy.   Past Medical History:  Diagnosis Date  . Anxiety   . History of miscarriage   . Irritable bowel syndrome     History reviewed. No pertinent surgical history.  Prior to Admission medications   Medication Sig Start Date End Date Taking? Authorizing Provider  escitalopram (LEXAPRO) 20 MG tablet Take 20 mg by mouth daily. 12/12/19  Yes [provider]  cephALEXin (KEFLEX) 500 MG capsule Take 1 capsule (500 mg total) by mouth 3 (three) times daily. 08/08/20   Tresea Mall, CNM  dicyclomine (BENTYL) 20 MG tablet Take 1 tablet (20 mg total) by mouth 3 (three) times daily before meals. Patient not taking: Reported on 08/08/2020 06/19/20   Ivette Loyal, NP  ondansetron (ZOFRAN ODT) 4 MG disintegrating tablet Take 1 tablet (4 mg total) by mouth every 8 (eight) hours as needed for nausea or vomiting. Patient not taking: Reported on 08/08/2020 06/02/20   Janace Aris, NP  Prenatal Vit-Fe Fumarate-FA (PNV PRENATAL PLUS MULTIVITAMIN) 27-1 MG TABS Take 1 tablet by mouth daily.    [provider]    Allergies as of 08/07/2020 - Review Complete 08/06/2020  Allergen Reaction Noted  . Penicillins Diarrhea and Nausea And Vomiting 11/11/2017    Family History  Problem Relation Age of Onset  . Hypertension Mother   . Diabetes Father     Social History   Socioeconomic History  . Marital status: Married    Spouse name: Not on file  . Number of children: Not on file  . Years of education: Not on file  . Highest education level: Not on file  Occupational History  . Not on file  Tobacco Use  . Smoking status: Never  Smoker  . Smokeless tobacco: Never Used  Vaping Use  . Vaping Use: Never used  Substance and Sexual Activity  . Alcohol use: Not Currently  . Drug use: Not on file  . Sexual activity: Yes    Birth control/protection: None  Other Topics Concern  . Not on file  Social History Narrative  . Not on file   Social Determinants of Health   Financial Resource Strain: Not on file  Food Insecurity: Not on file  Transportation Needs: Not on file  Physical Activity: Not on file  Stress: Not on file  Social Connections: Not on file  Intimate Partner Violence: Not on file    Review of Systems: See HPI, otherwise negative ROS  Physical Exam: BP 124/88   Pulse 84   Temp (!) 97.5 F (36.4 C) (Temporal)   Resp 18   Ht 5' (1.524 m)   Wt 77.6 kg   LMP 08/03/2020   SpO2 99%   BMI 33.40 kg/m  General:   Alert,  pleasant and cooperative in NAD Head:  Normocephalic and atraumatic. Neck:  Supple; no masses or thyromegaly. Lungs:  Clear throughout to auscultation.    Heart:  Regular rate and rhythm. Abdomen:  Soft, nontender and nondistended. Normal bowel sounds, without guarding, and without rebound.   Neurologic:  Alert and  oriented x4;  grossly normal neurologically.  Impression/Plan: Dominique Vega is here for an endoscopy and colonoscopy  to be performed for abdominal bloating, diarrhea  Risks, benefits, limitations, and alternatives regarding  endoscopy and colonoscopy have been reviewed with the patient.  Questions have been answered.  All parties agreeable.   Lannette Donath, MD  08/13/2020, 9:27 AM

## 2020-08-13 NOTE — Transfer of Care (Signed)
Immediate Anesthesia Transfer of Care Note  Patient: Dominique Vega  Procedure(s) Performed: COLONOSCOPY WITH PROPOFOL (N/A ) ESOPHAGOGASTRODUODENOSCOPY (EGD) WITH PROPOFOL (N/A )  Patient Location: PACU and Endoscopy Unit  Anesthesia Type:General  Level of Consciousness: drowsy and patient cooperative  Airway & Oxygen Therapy: Patient Spontanous Breathing  Post-op Assessment: Report given to RN and Post -op Vital signs reviewed and stable  Post vital signs: Reviewed and stable  Last Vitals:  Vitals Value Taken Time  BP 103/50 08/13/20 1045  Temp 36.1 C 08/13/20 1045  Pulse 96 08/13/20 1045  Resp 18 08/13/20 1045  SpO2 97 % 08/13/20 1045    Last Pain:  Vitals:   08/13/20 1045  TempSrc: Temporal  PainSc:          Complications: No complications documented.

## 2020-08-13 NOTE — Anesthesia Postprocedure Evaluation (Signed)
Anesthesia Post Note  Patient: Dominique Vega  Procedure(s) Performed: COLONOSCOPY WITH PROPOFOL (N/A ) ESOPHAGOGASTRODUODENOSCOPY (EGD) WITH PROPOFOL (N/A )  Patient location during evaluation: Endoscopy Anesthesia Type: General Level of consciousness: awake and alert and oriented Pain management: pain level controlled Vital Signs Assessment: post-procedure vital signs reviewed and stable Respiratory status: spontaneous breathing, nonlabored ventilation and respiratory function stable Cardiovascular status: blood pressure returned to baseline and stable Postop Assessment: no signs of nausea or vomiting Anesthetic complications: no   No complications documented.   Last Vitals:  Vitals:   08/13/20 1105 08/13/20 1115  BP: 91/73 105/73  Pulse: 80 76  Resp: 14 13  Temp:    SpO2: 100% 100%    Last Pain:  Vitals:   08/13/20 1115  TempSrc:   PainSc: 0-No pain                 Levi Klaiber

## 2020-08-13 NOTE — Op Note (Signed)
[facility] Gastroenterology Patient Name: Dominique Vega Procedure Date: 08/13/2020 10:17 AM MRN: [id number] Account #: [id number] Date of Birth: [birth year] Admit Type: Outpatient Age: 30 Room: [facility] ENDO ROOM 3 Gender: Female Note Status: Finalized Procedure:             Upper GI endoscopy Indications:           Abdominal bloating, Diarrhea Providers:             [name] MD, MD Referring MD:          Ronal Fear (Referring MD) Medicines:             General Anesthesia Complications:         No immediate complications. Estimated blood loss: None. Procedure:             Pre-Anesthesia Assessment:                        - Prior to the procedure, a History and Physical was                         performed, and patient medications and allergies were                         reviewed. The patient is competent. The risks and                         benefits of the procedure and the sedation options and                         risks were discussed with the patient. All questions                         were answered and informed consent was obtained.                         Patient identification and proposed procedure were                         verified by the physician, the nurse, the                         anesthesiologist, the anesthetist and the technician                         in the pre-procedure area in the procedure room in the                         endoscopy suite. Mental Status Examination: alert and                         oriented. Airway Examination: normal oropharyngeal                         airway and neck mobility. Respiratory Examination:                         clear to auscultation. CV Examination: normal.  Prophylactic Antibiotics: The patient does not require                         prophylactic antibiotics. Prior Anticoagulants: The                         patient has taken no previous anticoagulant or                          antiplatelet agents. ASA Grade Assessment: II - A                         patient with mild systemic disease. After reviewing                         the risks and benefits, the patient was deemed in                         satisfactory condition to undergo the procedure. The                         anesthesia plan was to use general anesthesia.                         Immediately prior to administration of medications,                         the patient was re-assessed for adequacy to receive                         sedatives. The heart rate, respiratory rate, oxygen                         saturations, blood pressure, adequacy of pulmonary                         ventilation, and response to care were monitored                         throughout the procedure. The physical status of the                         patient was re-assessed after the procedure.                        After obtaining informed consent, the endoscope was                         passed under direct vision. Throughout the procedure,                         the patient's blood pressure, pulse, and oxygen                         saturations were monitored continuously. The Endoscope                         was introduced through the mouth, and advanced to the  second part of duodenum. The upper GI endoscopy was                         accomplished without difficulty. The patient tolerated                         the procedure well. Findings:      The duodenal bulb and second portion of the duodenum were normal.       Biopsies for histology were taken with a cold forceps for evaluation of       celiac disease.      The entire examined stomach was normal. Biopsies were taken with a cold       forceps for Helicobacter pylori testing.      The cardia and gastric fundus were normal on retroflexion.      Esophagogastric landmarks were identified: the gastroesophageal junction        was found at 34 cm from the incisors.      LA Grade B (one or more mucosal breaks greater than 5 mm, not extending       between the tops of two mucosal folds) esophagitis with no bleeding was       found in the lower third of the esophagus. Impression:            - Normal duodenal bulb and second portion of the                         duodenum. Biopsied.                        - Normal stomach. Biopsied.                        - Esophagogastric landmarks identified.                        - LA Grade B reflux esophagitis with no bleeding. Recommendation:        - Await pathology results.                        - Proceed with colonoscopy as scheduled                        See colonoscopy report Procedure Code(s):     --- Professional ---                        [phone], Esophagogastroduodenoscopy, flexible,                         transoral; with biopsy, single or multiple Diagnosis Code(s):     --- Professional ---                        K21.00, Gastro-esophageal reflux disease with                         esophagitis, without bleeding                        R14.0, Abdominal distension (gaseous)  R19.7, Diarrhea, unspecified CPT copyright 2019 American Medical Association. All rights reserved. The codes documented in this report are preliminary and upon coder review may  be revised to meet current compliance requirements. Dr. Libby Maw [name] MD, MD 08/13/2020 10:30:33 AM This report has been signed electronically. Number of Addenda: 0 Note Initiated On: 08/13/2020 10:17 AM Estimated Blood Loss:  Estimated blood loss: none.      [facility]

## 2020-08-14 ENCOUNTER — Encounter: Payer: Self-pay | Admitting: Gastroenterology

## 2020-08-14 ENCOUNTER — Telehealth: Payer: Self-pay

## 2020-08-14 DIAGNOSIS — K529 Noninfective gastroenteritis and colitis, unspecified: Secondary | ICD-10-CM

## 2020-08-14 DIAGNOSIS — R197 Diarrhea, unspecified: Secondary | ICD-10-CM

## 2020-08-14 LAB — SURGICAL PATHOLOGY

## 2020-08-14 NOTE — Telephone Encounter (Signed)
Patient verbalized understanding of results and will come by to pick up kit  

## 2020-08-14 NOTE — Telephone Encounter (Signed)
-----   Message from [name], MD sent at 08/14/2020  2:00 PM EDT ----- Please order GI profile PCR and let patient know Dx: Chronic unexplained diarrhea

## 2020-08-20 ENCOUNTER — Other Ambulatory Visit: Payer: Self-pay | Admitting: Advanced Practice Midwife

## 2020-08-20 DIAGNOSIS — R8271 Bacteriuria: Secondary | ICD-10-CM

## 2020-08-20 MED ORDER — AMPICILLIN 500 MG PO CAPS: 500.0000 mg | ORAL_CAPSULE | Freq: Four times a day (QID) | ORAL | 0 refills | Status: AC

## 2020-08-20 NOTE — Progress Notes (Signed)
Rx ampicillin sent to treat gbs bacteriuria. Patient reported little improvement from keflex.

## 2020-08-20 NOTE — Telephone Encounter (Signed)
Can you send in a different RX for her since JEG os not in the office.

## 2020-08-21 LAB — GI PROFILE, STOOL, PCR

## 2020-08-25 NOTE — Telephone Encounter (Signed)
Wrote letter for patient.

## 2020-08-26 ENCOUNTER — Encounter: Payer: Self-pay | Admitting: Gastroenterology

## 2020-08-26 NOTE — Telephone Encounter (Signed)
The note yesterday said for yesterday and return today

## 2020-08-27 ENCOUNTER — Ambulatory Visit
Admission: RE | Admit: 2020-08-27 | Discharge: 2020-08-27 | Disposition: A | Discharge status: Home / Self Care | Payer: BC Managed Care – PPO | Source: Ambulatory Visit | Attending: Emergency Medicine | Admitting: Emergency Medicine

## 2020-08-27 ENCOUNTER — Other Ambulatory Visit: Payer: Self-pay

## 2020-08-27 VITALS — BP 118/84 | HR 115 | Temp 99.0°F | Resp 18

## 2020-08-27 DIAGNOSIS — R112 Nausea with vomiting, unspecified: Secondary | ICD-10-CM | POA: Diagnosis not present

## 2020-08-27 DIAGNOSIS — K529 Noninfective gastroenteritis and colitis, unspecified: Secondary | ICD-10-CM

## 2020-08-27 MED ORDER — ONDANSETRON 4 MG PO TBDP: 4.0000 mg | ORAL_TABLET | Freq: Three times a day (TID) | ORAL | 0 refills | Status: DC | PRN

## 2020-08-27 NOTE — ED Triage Notes (Signed)
Diarrhea x 2 days just after eating.  Currently undergoing eval for issues with ongoing diarrhea.  Vomiting started this morning with low grade fever of 100.4.  Has had two episodes today.  Took Ibuprofen and Zofran this morning which has helped.

## 2020-08-27 NOTE — ED Provider Notes (Signed)
Dominique Vega    CSN: [id number] Arrival date & time: 08/27/20  1450      History   Chief Complaint Chief Complaint  Patient presents with  . Emesis    HPI Dominique Vega is a 30 y.o. female.   Patient presents with low-grade fever and vomiting today.  T-max 100.4.  She also reports diarrhea x2 days but states this is her usual.  Treatment attempted at home with Zofran and ibuprofen; patient states the medications have helped and her symptoms have improved.  She denies rash, sore throat, cough, shortness of breath, abdominal pain, or other symptoms.  Her medical history includes irritable bowel syndrome and chronic diarrhea; she is followed by gastroenterology.  Her medical history also includes anxiety, pyelonephritis, abdominal bloating.  The history is provided by the patient and medical records.    Past Medical History:  Diagnosis Date  . Anxiety   . History of miscarriage   . Irritable bowel syndrome     Patient Active Problem List   Diagnosis Date Noted  . Abdominal bloating   . Diarrhea   . Recurrent UTI (urinary tract infection) 01/26/2019  . Pyelonephritis 01/25/2019  . Fever 05/09/2018    Past Surgical History:  Procedure Laterality Date  . COLONOSCOPY WITH PROPOFOL N/A 08/13/2020   Procedure: COLONOSCOPY WITH PROPOFOL;  Surgeon: [name], MD;  Location[facility] ENDOSCOPY;  Service: Gastroenterology;  Laterality: N/A;  . ESOPHAGOGASTRODUODENOSCOPY (EGD) WITH PROPOFOL N/A 08/13/2020   Procedure: ESOPHAGOGASTRODUODENOSCOPY (EGD) WITH PROPOFOL;  Surgeon: [name], MD;  Location: [facility] ENDOSCOPY;  Service: Gastroenterology;  Laterality: N/A;    OB History    Gravida  3   Para      Term      Preterm      AB  3   Living        SAB  3   IAB      Ectopic      Multiple      Live Births               Home Medications    Prior to Admission medications   Medication Sig Start Date End Date Taking? Authorizing Provider   dicyclomine (BENTYL) 20 MG tablet Take 1 tablet (20 mg total) by mouth 3 (three) times daily before meals. 06/19/20  Yes Ivette Loyal, NP  escitalopram (LEXAPRO) 20 MG tablet Take 20 mg by mouth daily. 12/12/19  Yes [provider]  omeprazole (PRILOSEC) 40 MG capsule Take 1 capsule (40 mg total) by mouth daily before breakfast. 08/13/20 09/12/20 Yes Vanga, Loel Dubonnet, MD  ondansetron (ZOFRAN ODT) 4 MG disintegrating tablet Take 1 tablet (4 mg total) by mouth every 8 (eight) hours as needed for nausea or vomiting. 08/27/20  Yes Mickie Bail, NP  Prenatal Vit-Fe Fumarate-FA (PNV PRENATAL PLUS MULTIVITAMIN) 27-1 MG TABS Take 1 tablet by mouth daily.   Yes [provider]  ampicillin (PRINCIPEN) 500 MG capsule Take 1 capsule (500 mg total) by mouth 4 (four) times daily for 7 days. 08/20/20 08/27/20  Tresea Mall, CNM    Family History Family History  Problem Relation Age of Onset  . Hypertension Mother   . Diabetes Father     Social History Social History   Tobacco Use  . Smoking status: Never Smoker  . Smokeless tobacco: Never Used  Vaping Use  . Vaping Use: Never used  Substance Use Topics  . Alcohol use: Not Currently  . Drug use:  Never     Allergies   Penicillins   Review of Systems Review of Systems  Constitutional: Positive for fever. Negative for chills.  HENT: Negative for ear pain and sore throat.   Eyes: Negative for pain and visual disturbance.  Respiratory: Negative for cough and shortness of breath.   Cardiovascular: Negative for chest pain and palpitations.  Gastrointestinal: Positive for diarrhea, nausea and vomiting. Negative for abdominal pain.  Genitourinary: Negative for dysuria and hematuria.  Musculoskeletal: Negative for arthralgias and back pain.  Skin: Negative for color change and rash.  Neurological: Negative for seizures and syncope.  All other systems reviewed and are negative.    Physical Exam Triage Vital Signs ED  Triage Vitals  Enc Vitals Group     BP      Pulse      Resp      Temp      Temp src      SpO2      Weight      Height      Head Circumference      Peak Flow      Pain Score      Pain Loc      Pain Edu?      Excl. in GC?    No data found.  Updated Vital Signs BP 118/84   Pulse (!) 115   Temp 99 F (37.2 C) (Oral)   Resp 18   LMP 08/03/2020   SpO2 97%   Visual Acuity Right Eye Distance:   Left Eye Distance:   Bilateral Distance:    Right Eye Near:   Left Eye Near:    Bilateral Near:     Physical Exam Vitals and nursing note reviewed.  Constitutional:      General: She is not in acute distress.    Appearance: Normal appearance. She is well-developed. She is not ill-appearing.  HENT:     Head: Normocephalic and atraumatic.     Mouth/Throat:     Mouth: Mucous membranes are moist.  Eyes:     Conjunctiva/sclera: Conjunctivae normal.  Cardiovascular:     Rate and Rhythm: Regular rhythm. Tachycardia present.     Heart sounds: Normal heart sounds.  Pulmonary:     Effort: Pulmonary effort is normal. No respiratory distress.     Breath sounds: Normal breath sounds.  Abdominal:     General: Bowel sounds are normal. There is no distension.     Palpations: Abdomen is soft.     Tenderness: There is no abdominal tenderness. There is no right CVA tenderness, left CVA tenderness, guarding or rebound.  Musculoskeletal:     Cervical back: Neck supple.  Skin:    General: Skin is warm and dry.  Neurological:     General: No focal deficit present.     Mental Status: She is alert and oriented to person, place, and time.     Gait: Gait normal.  Psychiatric:        Mood and Affect: Mood normal.        Behavior: Behavior normal.      UC Treatments / Results  Labs (all labs ordered are listed, but only abnormal results are displayed) Labs Reviewed - No data to display  EKG   Radiology No results found.  Procedures Procedures (including critical care  time)  Medications Ordered in UC Medications - No data to display  Initial Impression / Assessment and Plan / UC Course  I have reviewed the triage vital  signs and the nursing notes.  Pertinent labs & imaging results that were available during my care of the patient were reviewed by me and considered in my medical decision making (see chart for details).   Nausea with non-intractable vomiting, chronic diarrhea.  Patient is well-appearing and her exam is reassuring.  She states her symptoms have improved with ibuprofen and Zofran taken at home.  Patient states she needs a refill on the Zofran; Zofran prescription provided.  Instructed patient to take Tylenol instead of ibuprofen.  Instructed her to keep her self hydrated with clear liquids.  ED precautions discussed.  Instructed her to follow-up with her PCP if her symptoms are not improving.  Work note provided per patient request.  She agrees to plan of care.   Final Clinical Impressions(s) / UC Diagnoses   Final diagnoses:  Non-intractable vomiting with nausea, unspecified vomiting type  Chronic diarrhea     Discharge Instructions     Take the antinausea medication as directed.    Keep yourself hydrated with clear liquids, such as water, Gatorade, Pedialyte, Sprite, or ginger ale.    Go to the emergency department if you have acute worsening symptoms.    Follow up with your primary care provider if your symptoms are not improving.         ED Prescriptions    Medication Sig Dispense Auth. Provider   ondansetron (ZOFRAN ODT) 4 MG disintegrating tablet Take 1 tablet (4 mg total) by mouth every 8 (eight) hours as needed for nausea or vomiting. 20 tablet Mickie Bail, NP     PDMP not reviewed this encounter.   Mickie Bail, NP 08/27/20 [phone]

## 2020-08-27 NOTE — Telephone Encounter (Signed)
Called and left a message for call back  

## 2020-08-27 NOTE — Discharge Instructions (Signed)
Take the antinausea medication as directed.    Keep yourself hydrated with clear liquids, such as water, Gatorade, Pedialyte, Sprite, or ginger ale.    Go to the emergency department if you have acute worsening symptoms.    Follow up with your primary care provider if your symptoms are not improving.      

## 2020-08-27 NOTE — Telephone Encounter (Signed)
Informed patient that we could not keep giving her letters. Informed patient that also if we gave her documentation about her health we would more then likely have to do [facility] and we do not do FMAL. Patient verbalized understanding

## 2020-10-02 ENCOUNTER — Ambulatory Visit: Payer: BC Managed Care – PPO | Admitting: Medical

## 2020-11-05 ENCOUNTER — Encounter: Payer: Self-pay | Admitting: *Deleted

## 2020-11-05 ENCOUNTER — Ambulatory Visit: Payer: BC Managed Care – PPO | Admitting: Gastroenterology

## 2020-11-14 ENCOUNTER — Other Ambulatory Visit: Payer: Self-pay | Admitting: Gastroenterology

## 2020-12-23 ENCOUNTER — Other Ambulatory Visit: Payer: Self-pay

## 2020-12-23 ENCOUNTER — Ambulatory Visit: Payer: BC Managed Care – PPO | Admitting: Obstetrics & Gynecology

## 2020-12-23 ENCOUNTER — Encounter: Payer: Self-pay | Admitting: Obstetrics & Gynecology

## 2020-12-23 VITALS — BP 120/80 | Ht 60.0 in | Wt 169.0 lb

## 2020-12-23 DIAGNOSIS — Z3169 Encounter for other general counseling and advice on procreation: Secondary | ICD-10-CM | POA: Diagnosis not present

## 2020-12-23 NOTE — Progress Notes (Signed)
Gynecology Infertility Exam  PCP: Practice, [facility]  Chief Complaint: Difficulty achieving pregnancy  History of Present Illness: Patient is a 30 y.o. G3P0030 presenting for evaluation of infertility. Patient and partner have been attempting conception for 9 months. Marital Status: married for several years. Pregnancies with current partner YES  SHe has had a SAb in 2019, then again in 2021 with back to back SAb (no period in between) for a total of 3 SAb It took a while in 2021 to get pregnant (10 mos) and again now.  Menstrual and Endocrine History LMP: Patient's last menstrual period was 11/29/2020. Menarche:12 Shortest Interval: 26 Longest Interval: 28  days Duration of flow: 4 days Heavy Menses: yes Clots: no Intermenstrual Bleeding: no Postcoital Bleeding: no Dysmenorrhea: no Amenorrhea: not applicable Wt Change: no Hirsutism: no Balding: no Acne: no Galactorrhea: no  Some PMS and premenstrual headache  Obstetrical History SAb x3  Gynecologic History Last PAP: 2021 Previous abdominal or pelvic surgery: no Pelvic Pain:  no Endometriosis: yes Hot Flashes: yes DES Exposure: no Abnormal Pap: no Cervix Cryo/cone: no STD: yes PID: no  Infertility and Endocrine Studies BBT: no Endo. Bx.:no HSG: no PCT: no Laparoscopy: no Hormonal Studies: no Semen analysis: no Other Studies: no Meds: none Other Therapies: Not applicable Insemination:not applicable  Sexual History Frequency: a few times per month(s) Satisfied: yes Dyspareunia: yes, superficial Use of Lubricant: yes Douching: no Number of lifetime sex partners: 1  Contraception None  Family History Thyroid Problems: no Heart Condition or High Blood Pressure: no Blood Clot or Stroke: no Diabetes: no Cancer: yes Birth Defects/Inherited diseases:no Infectious diseases (mumps, TB, Rubella):no Other Medical Problems: no MR/autism/fragile X or POF: no  Habits Cigarettes:     Wife -  no    Husband - no Alcohol:    Wife -  no    Husband - no Marijuana: no  PMHx: She  has a past medical history of Anxiety, History of miscarriage, and Irritable bowel syndrome. Also,  has a past surgical history that includes Colonoscopy with propofol (N/A, 08/13/2020) and Esophagogastroduodenoscopy (egd) with propofol (N/A, 08/13/2020)., family history includes Diabetes in her father; Hypertension in her mother.,  reports that she has never smoked. She has never used smokeless tobacco. She reports that she does not currently use alcohol. She reports that she does not use drugs.  She has a current medication list which includes the following prescription(s): escitalopram, pnv prenatal plus multivitamin, dicyclomine, omeprazole, and ondansetron. Also, is allergic to penicillins.  Review of Systems  Constitutional:  Negative for chills, fever and malaise/fatigue.  HENT:  Negative for congestion, sinus pain and sore throat.   Eyes:  Negative for blurred vision and pain.  Respiratory:  Negative for cough and wheezing.   Cardiovascular:  Negative for chest pain and leg swelling.  Gastrointestinal:  Negative for abdominal pain, constipation, diarrhea, heartburn, nausea and vomiting.  Genitourinary:  Negative for dysuria, frequency, hematuria and urgency.  Musculoskeletal:  Negative for back pain, joint pain, myalgias and neck pain.  Skin:  Negative for itching and rash.  Neurological:  Negative for dizziness, tremors and weakness.  Endo/Heme/Allergies:  Does not bruise/bleed easily.  Psychiatric/Behavioral:  Negative for depression. The patient is not nervous/anxious and does not have insomnia.    Objective: BP 120/80   Ht 5' (1.524 m)   Wt 169 lb (76.7 kg)   LMP 11/29/2020   BMI 33.01 kg/m  Physical Exam Constitutional:      General: She is not  in acute distress.    Appearance: She is well-developed.  Musculoskeletal:        General: Normal range of motion.  Neurological:      Mental Status: She is alert and oriented to person, place, and time.  Skin:    General: Skin is warm and dry.  Vitals reviewed.     Assessment: 30 y.o. G3P0030 1. Infertility counseling H/O SAbx3.  First trimester, one was too soon after a prior, unlikely dx of Recurrent Miscarriage - Anti mullerian hormone; Future - Estradiol; Future - FSH/LH; Future - TSH; Future - ABO AND RH ; Future - DG Hysterogram (HSG); Future  - Consider Clomid  Plan: 1) We discussed the underlying etiologies which may be implicated in a couple experiencing difficulty conceiving.  The average couple will conceive within the span of 1 year with unprotected coitus, with a monthly fecundity rate of 20% or 1 in 5.  Even without further work up or intervention the patient and her partner may be successful in conceiving unassisted, although if an underlying etiology can be identified and addressed fecundity rate may improve.  The work up entails examining for ovulatory function, tubal patency, and ruling out female factor infertility.  These may be looked at concurrently or sequentially.  The downside of sequential work up is that this method may miss issues if more than one compartment is contributing.  She is aware that tubal factor or moderate to severe female factor infertility will require further consultation with a reproductive endocrinologist.  In the case of anovulation, use of Clomid (clomiphen citrate) or Femara (letrazole) were discussed with the understanding the the later is an off-label, but well supported use.  With either of these drugs the risk of multiples increases from the standard population rate of 2% to approximately 10%, with higher order multiples possible but unlikely.  Both drugs may require some time to titrate to the appropriate dosage to ensure consistent ovulation.  Cycles will be limited to 6 cycles on each drug secondary to decreasing rates of conception after 6 cycles.  In addition should patient  be started on ovulation induction with Clomid she was advised to discontinue the drug for any vision changes as this is a rare but potentially permanent side-effect if medication is continued.  We discussed timing of intercourse as well as the use of ovulation predictor kits identify the patient's fertile window each month.     2) Preconception counseling - immunization up to date.  The patient denies any family history of conditions which would warrant preconception genetic counseling or testing on her or her partner.  Instructed to start prenatal vitamins while trying to conceive.    Annamarie Major, MD, [facility], [facility] 12/23/2020  11:03 AM

## 2020-12-23 NOTE — Patient Instructions (Signed)
Hysterosalpingography  Hysterosalpingography is a procedure in which a doctor looks inside a woman's womb (uterus) and fallopian tubes. During the procedure, a dye is injected into the womb. Then X-rays are taken. The dye makes the womb show up on X-rays. A doctor canuse these x-rays to see if there are any problems with the womb and tubes. Tell a doctor about: Any allergies you have. All medicines you are taking, including vitamins, herbs, eye drops, creams, and over-the-counter medicines. Any problems you or family members have had with anesthetic medicines. Any blood disorders you have. Any surgeries you have had. Any medical conditions you have. Whether you are pregnant or may be pregnant. Whether you have been diagnosed with an STI (sexually transmitted infection) or you think you have an STI. What are the risks? Generally, this is a safe procedure. However, problems may occur, including: Infection. Allergic reaction to medicines or dyes. A hole in the womb or tubes. Damage to other structures or organs. What happens before the procedure? Medicines Ask your doctor about changing or stopping: Your normal medicines. Vitamins, herbs, and supplements. Over-the-counter medicines. Do not take aspirin or ibuprofen unless you are told to. General instructions Schedule the procedure after your period stops, but before your next ovulation. This is usually between day 5 and day 10 of your period. Day 1 is the first day of your period. Plan to have a responsible adult take you home from the hospital or clinic. Plan to have a responsible adult care for you for the time you are told after you leave the hospital or clinic. This is important. Pee before the procedure starts. What happens during the procedure? You may be given: A sedative. This medicine helps you relax. An anesthetic. This medicine numbs certain areas of your body. An over-the-counter pain medicine. You will lie down on your  back. Your feet will be placed in foot rests (stirrups). A device (speculum) will be placed into your vagina. This lets your doctor see the lower part of your womb (cervix). Your cervix will be washed with a soap that kills germs. A medicine may be injected into your cervix to numb it. A tube will be passed into your womb. Dye will be passed through the tube and into the womb. The dye may cause some cramps. X-rays will be taken. You may be asked to change positions. The tube will be taken out. The dye will flow out of your vagina on its own. The procedure may vary among doctors and hospitals. What can I expect after procedure? You may have mild cramps and vaginal bleeding. Most of the dye will flow out on its own. Wear a pad if needed. Do not use a tampon. You might feel dizzy or feel like you may vomit (nauseous). Follow these instructions at home: Do not douche, use tampons, or have sex until your doctor says it is safe. Do not take baths, swim, or use a hot tub. Take showers instead of baths for 2 weeks, or for as long as told by your doctor. If you were given a sedative during your procedure, do notdrive or use machines until your doctor says that it is safe. A sedative is a medicine that helps you relax. Take over-the-counter and prescription medicines only as told by your doctor. It is up to you to get the results of your procedure. Ask how to get your results when they are ready. Keep all follow-up visits. Contact a doctor if: You have a fever. You  have chills. You faint. You have very bad cramps. You have a rash that itches or is swollen. You have a bad-smelling fluid coming from your vagina. You have bleeding from your vagina that lasts for more than 4 days. Get help right away if: You feel like you may vomit or you vomit. You have heavy bleeding from the vagina that soaks more than one pad every hour. You have very bad pain in your belly (abdomen). Summary In this  procedure, dye is injected into the womb. Then, X-rays are taken. The dye makes the womb show up on the X-rays. Plan to have this procedure after your period stops, but before your next ovulation. This is often between days 5 and 10 of your last period. Day 1 is the first day of your period. After the procedure, you may have mild cramping and bleeding. This should go away after a short time. Most of the dye will flow out on its own. Wear a pad if needed. Do not douche, use tampons, or have sex until your doctor says it is safe. This information is not intended to replace advice given to you by your health care provider. Make sure you discuss any questions you have with your healthcare provider. Document Revised: 12/05/2019 Document Reviewed: 12/05/2019 Elsevier Patient Education  2022 [employer].

## 2020-12-25 ENCOUNTER — Other Ambulatory Visit: Payer: Self-pay | Admitting: Obstetrics & Gynecology

## 2020-12-25 ENCOUNTER — Telehealth: Payer: Self-pay

## 2020-12-25 DIAGNOSIS — Z3169 Encounter for other general counseling and advice on procreation: Secondary | ICD-10-CM

## 2020-12-25 NOTE — Telephone Encounter (Signed)
Yes today preferred

## 2020-12-25 NOTE — Telephone Encounter (Signed)
Called pt to confirm that she saw where the HSG was scheduled for 9/1 w [facility]. Advised to arr 15 min prior

## 2020-12-25 NOTE — Telephone Encounter (Signed)
-----   Message from Nadara Mustard, MD sent at 12/23/2020 10:56 AM EDT ----- Regarding: HSG [facility] HSG 01/01/21

## 2020-12-25 NOTE — Telephone Encounter (Signed)
Pt calling today, was supposed to have HSG, but she has a positive pregnancy test. Can she come in for blood work to see how far along she is? Please advise/put in order.

## 2020-12-29 ENCOUNTER — Other Ambulatory Visit: Payer: BC Managed Care – PPO

## 2020-12-29 ENCOUNTER — Other Ambulatory Visit: Payer: Self-pay

## 2020-12-29 DIAGNOSIS — Z3169 Encounter for other general counseling and advice on procreation: Secondary | ICD-10-CM

## 2020-12-29 NOTE — Telephone Encounter (Signed)
Left voicemail pending my chart message review. No response from patient yet

## 2020-12-30 LAB — FSH/LH
FSH: 0.7 m[IU]/mL
LH: 1.2 m[IU]/mL

## 2020-12-30 LAB — TSH: TSH: 1.39 u[IU]/mL (ref 0.450–4.500)

## 2020-12-30 LAB — ABO AND RH: Rh Factor: POSITIVE

## 2020-12-30 LAB — ESTRADIOL: Estradiol: 149 pg/mL

## 2020-12-30 NOTE — Telephone Encounter (Signed)
Patient is scheduled for 12/29/20 for labs

## 2020-12-30 NOTE — Telephone Encounter (Signed)
Check w Sandy/ Lab as to why or when Beta hCG is going to be done on blood draw from yesterday.  I see it ordered but not done

## 2020-12-30 NOTE — Telephone Encounter (Signed)
Sandy did not draw the Beta, She's going to call and have it added to the blood that was drawn yesterday.

## 2020-12-31 NOTE — Telephone Encounter (Signed)
Still not back; check w Victorino Dike (or [place]) and if not do-able then we need to have her come in to retest

## 2021-01-01 ENCOUNTER — Telehealth: Payer: Self-pay

## 2021-01-01 ENCOUNTER — Other Ambulatory Visit: Payer: Self-pay | Admitting: Obstetrics & Gynecology

## 2021-01-01 ENCOUNTER — Inpatient Hospital Stay: Admission: RE | Admit: 2021-01-01 | Payer: BC Managed Care – PPO | Source: Ambulatory Visit

## 2021-01-01 ENCOUNTER — Other Ambulatory Visit: Payer: BC Managed Care – PPO

## 2021-01-01 DIAGNOSIS — N926 Irregular menstruation, unspecified: Secondary | ICD-10-CM

## 2021-01-01 LAB — BETA HCG QUANT (REF LAB): hCG Quant: 64 m[IU]/mL

## 2021-01-01 LAB — SPECIMEN STATUS REPORT

## 2021-01-01 NOTE — Telephone Encounter (Signed)
[facility] calling with stat Beta HCG - 64.  PH notified.  No hsg today  Will follow up w office visit and later ultrasound.  Pt aware no procedure today; she is pregnant; will get back with her if she needs to be seen sooner; pt also asking about f/u blood work. Msg sent to [facility] in secure chat.

## 2021-01-03 LAB — ANTI MULLERIAN HORMONE: ANTI-MULLERIAN HORMONE (AMH): 1.4 ng/mL

## 2021-01-06 ENCOUNTER — Other Ambulatory Visit: Payer: BC Managed Care – PPO

## 2021-01-06 ENCOUNTER — Other Ambulatory Visit: Payer: Self-pay

## 2021-01-06 DIAGNOSIS — N926 Irregular menstruation, unspecified: Secondary | ICD-10-CM

## 2021-01-07 LAB — BETA HCG QUANT (REF LAB): hCG Quant: 5651 m[IU]/mL

## 2021-01-12 ENCOUNTER — Other Ambulatory Visit: Payer: Self-pay | Admitting: Obstetrics & Gynecology

## 2021-01-12 MED ORDER — ONDANSETRON 4 MG PO TBDP: 4.0000 mg | ORAL_TABLET | Freq: Three times a day (TID) | ORAL | 0 refills | Status: DC | PRN

## 2021-01-12 NOTE — Telephone Encounter (Signed)
It is ok to perform her routine duties, daily functioning, even work.  Avoid strenuous lifting or activities.  Avoid sex until we get ultrasound and bleeding stops.

## 2021-01-12 NOTE — Telephone Encounter (Signed)
Should she continue to rest? Or just go on about her day?

## 2021-01-12 NOTE — Telephone Encounter (Signed)
Can you send in a rx for her?

## 2021-01-12 NOTE — Telephone Encounter (Signed)
Do you approve this? Note for being out of work today.

## 2021-01-13 NOTE — Telephone Encounter (Signed)
Note approved

## 2021-01-14 ENCOUNTER — Other Ambulatory Visit: Payer: Self-pay

## 2021-01-14 ENCOUNTER — Encounter: Payer: Self-pay | Admitting: Obstetrics and Gynecology

## 2021-01-14 ENCOUNTER — Ambulatory Visit: Payer: BC Managed Care – PPO | Admitting: Obstetrics and Gynecology

## 2021-01-14 VITALS — BP 110/60 | Ht 60.0 in | Wt 168.0 lb

## 2021-01-14 DIAGNOSIS — N96 Recurrent pregnancy loss: Secondary | ICD-10-CM | POA: Diagnosis not present

## 2021-01-14 DIAGNOSIS — Z3689 Encounter for other specified antenatal screening: Secondary | ICD-10-CM | POA: Diagnosis not present

## 2021-01-14 DIAGNOSIS — O209 Hemorrhage in early pregnancy, unspecified: Secondary | ICD-10-CM

## 2021-01-14 MED ORDER — PROGESTERONE 4 % VA GEL: 1.0000 | Freq: Every day | VAGINAL | 0 refills | Status: AC

## 2021-01-14 NOTE — Progress Notes (Signed)
Obstetric Problem Visit    Chief Complaint: No chief complaint on file.   History of Present Illness: Patient is a 30 y.o. [id number] Unknown presenting for first trimester bleeding.  The onset of bleeding was in the past week.  Is bleeding equal to or greater than normal menstrual flow:  No Any recent trauma:  No History of prior miscarriage:  Yes Prior ultrasound demonstrating IUP: none.  Prior ultrasound demonstrating viable IUP:  No Prior Serum HCG:  Yes 5,688mIU/mL on 01/06/21 Rh status: A   Review of Systems: Review of Systems  Constitutional: Negative.   Gastrointestinal: Negative.   Genitourinary: Negative.    Past Medical History:  Patient Active Problem List   Diagnosis Date Noted   Abdominal bloating    Diarrhea    Recurrent UTI (urinary tract infection) 01/26/2019   Pyelonephritis 01/25/2019   Fever 05/09/2018    Past Surgical History:  Past Surgical History:  Procedure Laterality Date   COLONOSCOPY WITH PROPOFOL N/A 08/13/2020   Procedure: COLONOSCOPY WITH PROPOFOL;  Surgeon: [name], MD;  Location: [facility] ENDOSCOPY;  Service: Gastroenterology;  Laterality: N/A;   ESOPHAGOGASTRODUODENOSCOPY (EGD) WITH PROPOFOL N/A 08/13/2020   Procedure: ESOPHAGOGASTRODUODENOSCOPY (EGD) WITH PROPOFOL;  Surgeon: [name], MD;  Location: [facility] ENDOSCOPY;  Service: Gastroenterology;  Laterality: N/A;    Obstetric History: [id number]  Family History:  Family History  Problem Relation Age of Onset   Hypertension Mother    Diabetes Father     Social History:  Social History   Socioeconomic History   Marital status: Married    Spouse name: Not on file   Number of children: Not on file   Years of education: Not on file   Highest education level: Not on file  Occupational History   Not on file  Tobacco Use   Smoking status: Never   Smokeless tobacco: Never  Vaping Use   Vaping Use: Never used  Substance and Sexual Activity   Alcohol use: Not Currently    Drug use: Never   Sexual activity: Yes    Birth control/protection: None  Other Topics Concern   Not on file  Social History Narrative   Not on file   Social Determinants of Health   Financial Resource Strain: Not on file  Food Insecurity: Not on file  Transportation Needs: Not on file  Physical Activity: Not on file  Stress: Not on file  Social Connections: Not on file  Intimate Partner Violence: Not on file    Allergies:  Allergies  Allergen Reactions   Penicillins Diarrhea and Nausea And Vomiting    Medications: Prior to Admission medications   Medication Sig Start Date End Date Taking? Authorizing Provider  escitalopram (LEXAPRO) 20 MG tablet Take 20 mg by mouth daily. 12/12/19  Yes [provider]  ondansetron (ZOFRAN ODT) 4 MG disintegrating tablet Take 1 tablet (4 mg total) by mouth every 8 (eight) hours as needed for nausea or vomiting. 01/12/21  Yes Nadara Mustard, MD  Prenatal Vit-Fe Fumarate-FA (PNV PRENATAL PLUS MULTIVITAMIN) 27-1 MG TABS Take 1 tablet by mouth daily.   Yes [provider]  PROGESTERONE, VAGINAL, 4 % GEL Place 1 Applicatorful vaginally at bedtime for 30 doses. 01/14/21 02/13/21 Yes Vena Austria, MD  dicyclomine (BENTYL) 20 MG tablet Take 1 tablet (20 mg total) by mouth 3 (three) times daily before meals. Patient not taking: Reported on 12/23/2020 06/19/20   Ivette Loyal, NP  omeprazole (PRILOSEC) 40 MG capsule Take 1 capsule (  40 mg total) by mouth daily before breakfast. 08/13/20 09/12/20  [name], MD    Physical Exam Vitals: Blood pressure 110/60, height 5' (1.524 m), weight 168 lb (76.2 kg), last menstrual period 11/29/2020, unknown if currently breastfeeding. General: NAD HEENT: normocephalic, anicteric Neck: Thyroid non-enlarged, no nodules Pulmonary: No increased work of breathing, Neurologic: Grossly intact Psychiatric: mood appropriate, affect full  Dating Scan Singleton viable IUP at [redacted]w[redacted]d by CRL  0.43cm (0.45cm, 0.43cm, and 0.40cm) giving an ultrasound EDD of 09/08/2021.  FHT 121 BPM ROV normal LOV Corpus luteum  YS 0.40cm No uterine fibroids  There is a viable singleton gestation.  The fetal biometry correlates with established dating. Detailed evaluation of the fetal anatomy is precluded by early gestational age.  It must be noted that a normal ultrasound particular at this early gestational age is unable to rule out fetal aneuploidy, risk of first trimester miscarriage, or anatomic birth defects.    Assessment: 30 y.o. [id number] Unknown presenting for evaluation of first trimester vaginal bleeding  Plan: Problem List Items Addressed This Visit   None Visit Diagnoses     First trimester bleeding    -  Primary   History of recurrent miscarriages           1) First trimester bleeding - incidence and clinical course of first trimester bleeding is discussed in detail with the patient today.  Approximately 1/3 of pregnancies ending in live births experienced 1st trimester bleeding.  The amount of bleeding is variable and not necessarily predictive of outcome.  Sources may be cervical or uterine.  Subchorionic hemorrhages are a frequent concurrent findings on ultrasound and are followed expectantly.  These often absorb or regress spontaneously although risk for expansion and further disruption of the utero-placental interface leading to miscarriage is possible.  There is no clearly documented benefit to limiting or modifying activity and sexual intercourse in altering clinic course of 1st trimester bleeding.    2) Recurrent miscarriage  At present there is no evidence to support the use of vaginal progesterone in women with a history of unexplained recurrent first trimester miscarriage in an attempt to minimize risk of miscarriage in a subsequent pregnancy.  Its use did not show a clinically significant increase in live birth rate.  However, there were also no identified adverse events  to its use.  This data was shared with the patient.    "A Randomized Trial of Progesterone in Women with Recurrent Miscarriages" NEJM 373;22 March 28, 2014  PRISM trial was a multicenter, double-blinded, randomized control trial conducted in the [place] looking at the effects of progesterone supplementation in women with threatened miscarriage. Women were adminstered 400mg  of vaginal progesterone twice daily from onset of bleeding through [redacted] weeks gestation.  The trial included 4038 women.  There was no significant improvement in livebirth rates demonstrated in the overall cohort (delivery after 34 weeks).  However in women with a history of one prior miscarriage progesterone group 76% resulted in liveborn pregnancy vs 72% for placebo (RR 1.05 95% CI 1.00 to 1.12).  For women with three or more first trimester miscarriages the effect was more pronounced with 72% liveborn pregnancy rate vs 57% for placebo (RR 1.28 95% CI 1.08 to 1.151).  "A Randomized Trial of Progesterone in Women with Bleeding in Early Pregnancy" , MD et al NEJM 2019, 380:1815-1824   Definition of recurrent miscarriage, defined as 3 or more successive first trimester losses discussed in detail.  The most commonly identifiable etiology  is antiphospholipid antibody syndrome (APS).  While APS is the only thrombophilia with a clearly proven association for first trimester miscarriage, other hypercoagulable disorders while showing a weak or inconsistent relationship to first trimester miscarriage are often times still tested for,  These include Factor V Leiden, MTHFR (homozygous), prothrombin gene mutation, Protein C and S deficiency.  Uterine structural lesions, endocrine factors (thyoid disease, diabetes, and PCOS), and disorders in parental karyotype has also been implicated.  Use of daily Asprin has been evaluated in the setting of recurrent pregnancy loss and shows no clear benefit in patient without antiphospholipid antibody  syndrome.  Approximately 50% of couple with recurrent miscarriage will not have a readily identifiable etiology  Several studies have examined and established a role for emotional support and stress reduction in improving pregnancy outcomes for patient with recurrent miscarriage. The tender love and care model utilizes weekly to twice weekly follow up, more frequent ultrasound, and has consistently demonstrated improved live birth rates in several studies.  The risk of miscarriage following documentation of fetal heart tones drops significantly to approximately 3%.  - patient opts for trial of vaginal progesterone  3) The patient is A positive  rhogam is therefore not indicated to decrease the risk rhesus alloimmunization.    4) Routine bleeding precautions were discussed with the patient prior the conclusion of today's visit.    Vena Austria, MD, [facility], [facility] 01/14/2021, 2:52 PM

## 2021-01-15 ENCOUNTER — Ambulatory Visit
Admission: RE | Admit: 2021-01-15 | Discharge: 2021-01-15 | Disposition: A | Discharge status: Home / Self Care | Payer: BC Managed Care – PPO | Source: Ambulatory Visit | Attending: Emergency Medicine | Admitting: Emergency Medicine

## 2021-01-15 ENCOUNTER — Encounter: Payer: BC Managed Care – PPO | Admitting: Obstetrics & Gynecology

## 2021-01-15 VITALS — BP 114/81 | HR 95 | Temp 98.5°F | Resp 18

## 2021-01-15 DIAGNOSIS — Z1152 Encounter for screening for COVID-19: Secondary | ICD-10-CM | POA: Diagnosis not present

## 2021-01-15 DIAGNOSIS — B349 Viral infection, unspecified: Secondary | ICD-10-CM

## 2021-01-15 DIAGNOSIS — Z3A01 Less than 8 weeks gestation of pregnancy: Secondary | ICD-10-CM

## 2021-01-15 NOTE — ED Triage Notes (Signed)
Pt reports fever, HA, body aches starting last night.  Temp was about 100.8.  Last took Tylenol approx 1000 today.   Pt has some nausea but is also [redacted] wks pregnant.  Is taking Zofran to help.

## 2021-01-15 NOTE — ED Provider Notes (Signed)
Dominique Vega    CSN: [id number] Arrival date & time: 01/15/21  1112      History   Chief Complaint Chief Complaint  Patient presents with   Fever    APPT 11:15     HPI Dominique Vega is a 30 y.o. female.  Patient presents with low-grade fever, headache, body aches, headache, nausea since last night.  1 episode of emesis this morning.  She is approximately [redacted] weeks pregnant.  T-max 100.8.  Treatment at home with Tylenol and Zofran.  She denies abdominal pain, diarrhea, cough, shortness of breath, or other symptoms.  Her medical history also includes IBS and anxiety.    The history is provided by the patient and medical records.   Past Medical History:  Diagnosis Date   Anxiety    History of miscarriage    Irritable bowel syndrome     Patient Active Problem List   Diagnosis Date Noted   Abdominal bloating    Diarrhea    Recurrent UTI (urinary tract infection) 01/26/2019   Pyelonephritis 01/25/2019   Fever 05/09/2018    Past Surgical History:  Procedure Laterality Date   COLONOSCOPY WITH PROPOFOL N/A 08/13/2020   Procedure: COLONOSCOPY WITH PROPOFOL;  Surgeon: [name], MD;  Location: [facility] ENDOSCOPY;  Service: Gastroenterology;  Laterality: N/A;   ESOPHAGOGASTRODUODENOSCOPY (EGD) WITH PROPOFOL N/A 08/13/2020   Procedure: ESOPHAGOGASTRODUODENOSCOPY (EGD) WITH PROPOFOL;  Surgeon: [name], MD;  Location: [facility] ENDOSCOPY;  Service: Gastroenterology;  Laterality: N/A;    OB History     Gravida  4   Para      Term      Preterm      AB  3   Living         SAB  3   IAB      Ectopic      Multiple      Live Births               Home Medications    Prior to Admission medications   Medication Sig Start Date End Date Taking? Authorizing Provider  escitalopram (LEXAPRO) 20 MG tablet Take 20 mg by mouth daily. 12/12/19  Yes [provider]  ondansetron (ZOFRAN ODT) 4 MG disintegrating tablet Take 1 tablet (4 mg  total) by mouth every 8 (eight) hours as needed for nausea or vomiting. 01/12/21  Yes Nadara Mustard, MD  Prenatal Vit-Fe Fumarate-FA (PNV PRENATAL PLUS MULTIVITAMIN) 27-1 MG TABS Take 1 tablet by mouth daily.   Yes [provider]  PROGESTERONE, VAGINAL, 4 % GEL Place 1 Applicatorful vaginally at bedtime for 30 doses. 01/14/21 02/13/21 Yes Vena Austria, MD  dicyclomine (BENTYL) 20 MG tablet Take 1 tablet (20 mg total) by mouth 3 (three) times daily before meals. Patient not taking: No sig reported 06/19/20   Ivette Loyal, NP  omeprazole (PRILOSEC) 40 MG capsule Take 1 capsule (40 mg total) by mouth daily before breakfast. 08/13/20 09/12/20  [name], MD    Family History Family History  Problem Relation Age of Onset   Hypertension Mother    Diabetes Father     Social History Social History   Tobacco Use   Smoking status: Never   Smokeless tobacco: Never  Vaping Use   Vaping Use: Never used  Substance Use Topics   Alcohol use: Not Currently   Drug use: Never     Allergies   Penicillins   Review of Systems Review of Systems  Constitutional:  Positive for fever. Negative for chills.  HENT:  Negative for ear pain and sore throat.   Respiratory:  Negative for cough and shortness of breath.   Cardiovascular:  Negative for chest pain and palpitations.  Gastrointestinal:  Positive for nausea and vomiting. Negative for abdominal pain and diarrhea.  Genitourinary:  Negative for dysuria and hematuria.  Skin:  Negative for color change and rash.  Neurological:  Positive for headaches. Negative for syncope.  All other systems reviewed and are negative.   Physical Exam Triage Vital Signs ED Triage Vitals  Enc Vitals Group     BP 01/15/21 1124 114/81     Pulse Rate 01/15/21 1124 95     Resp 01/15/21 1124 18     Temp 01/15/21 1124 98.5 F (36.9 C)     Temp Source 01/15/21 1124 Oral     SpO2 01/15/21 1124 97 %     Weight --      Height --      Head  Circumference --      Peak Flow --      Pain Score 01/15/21 1132 0     Pain Loc --      Pain Edu? --      Excl. in GC? --    No data found.  Updated Vital Signs BP 114/81 (BP Location: Left Arm)   Pulse 95   Temp 98.5 F (36.9 C) (Oral)   Resp 18   LMP 11/29/2020   SpO2 97%   Breastfeeding No   Visual Acuity Right Eye Distance:   Left Eye Distance:   Bilateral Distance:    Right Eye Near:   Left Eye Near:    Bilateral Near:     Physical Exam Vitals and nursing note reviewed.  Constitutional:      General: She is not in acute distress.    Appearance: She is well-developed. She is not ill-appearing.  HENT:     Head: Normocephalic and atraumatic.     Right Ear: Tympanic membrane normal.     Left Ear: Tympanic membrane normal.     Nose: Nose normal.     Mouth/Throat:     Mouth: Mucous membranes are moist.     Pharynx: Oropharynx is clear.  Eyes:     Conjunctiva/sclera: Conjunctivae normal.  Cardiovascular:     Rate and Rhythm: Normal rate and regular rhythm.     Heart sounds: Normal heart sounds.  Pulmonary:     Effort: Pulmonary effort is normal. No respiratory distress.     Breath sounds: Normal breath sounds.  Abdominal:     Palpations: Abdomen is soft.     Tenderness: There is no abdominal tenderness.  Musculoskeletal:     Cervical back: Neck supple.  Skin:    General: Skin is warm and dry.  Neurological:     General: No focal deficit present.     Mental Status: She is alert and oriented to person, place, and time.     Gait: Gait normal.  Psychiatric:        Mood and Affect: Mood normal.        Behavior: Behavior normal.     UC Treatments / Results  Labs (all labs ordered are listed, but only abnormal results are displayed) Labs Reviewed  COVID-19, FLU A+B NAA    EKG   Radiology No results found.  Procedures Procedures (including critical care time)  Medications Ordered in UC Medications - No data to display  Initial Impression /  Assessment and Plan / UC Course  I have reviewed the triage vital signs and the nursing notes.  Pertinent labs & imaging results that were available during my care of the patient were reviewed by me and considered in my medical decision making (see chart for details).   Viral illness, [redacted] weeks pregnant.  COVID and Flu pending.  Instructed patient to self quarantine per CDC guidelines.  Discussed symptomatic treatment including Tylenol, rest, hydration.  Instructed patient to follow up with PCP or OB/Gyn if symptoms are not improving.  Patient agrees to plan of care.    Final Clinical Impressions(s) / UC Diagnoses   Final diagnoses:  Encounter for screening for COVID-19  Viral illness  Less than [redacted] weeks gestation of pregnancy     Discharge Instructions      Your COVID test is pending.  You should self quarantine until the test result is back.    Take Tylenol as needed for fever or discomfort.  Rest and keep yourself hydrated.    Follow-up with your primary care provider if your symptoms are not improving.         ED Prescriptions   None    PDMP not reviewed this encounter.   Mickie Bail, NP 01/15/21 [phone]

## 2021-01-15 NOTE — Discharge Instructions (Addendum)
Your COVID test is pending.  You should self quarantine until the test result is back.    Take Tylenol as needed for fever or discomfort.  Rest and keep yourself hydrated.    Follow-up with your primary care provider if your symptoms are not improving.     

## 2021-01-17 LAB — COVID-19, FLU A+B NAA
Influenza A, NAA: NOT DETECTED
Influenza B, NAA: NOT DETECTED
SARS-CoV-2, NAA: NOT DETECTED

## 2021-01-22 ENCOUNTER — Ambulatory Visit
Admission: RE | Admit: 2021-01-22 | Discharge: 2021-01-22 | Disposition: A | Discharge status: Home / Self Care | Payer: BC Managed Care – PPO | Source: Ambulatory Visit

## 2021-01-22 ENCOUNTER — Ambulatory Visit
Admission: EM | Admit: 2021-01-22 | Discharge: 2021-01-22 | Disposition: A | Discharge status: Home / Self Care | Payer: BC Managed Care – PPO | Source: Ambulatory Visit | Attending: Emergency Medicine | Admitting: Emergency Medicine

## 2021-01-22 ENCOUNTER — Other Ambulatory Visit: Payer: Self-pay

## 2021-01-22 DIAGNOSIS — B349 Viral infection, unspecified: Secondary | ICD-10-CM

## 2021-01-22 NOTE — ED Triage Notes (Signed)
Pt reports fever, headache, body aches since yesterday. Pt states she was 09/15 for same issues. She states symptoms had resolved but returned yesterday. Treating symptoms with tylenol and claratin.

## 2021-01-22 NOTE — ED Provider Notes (Signed)
CHIEF COMPLAINT:   Chief Complaint  Patient presents with   Generalized Body Aches   Chills   Fever     SUBJECTIVE/HPI:   Fever A very pleasant 30 y.o.Female presents today with fever, headache, body aches that started yesterday.  Patient reports a maximum temperature of 101 F.  Patient reports that she was seen on 9/15 for the same issues.  Patient reports that they had since resolved, but returned yesterday.  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  She reports the use of Tylenol, Claritin. Patient does not report any shortness of breath, chest pain, palpitations, visual changes, weakness, tingling, nausea, vomiting, diarrhea, chills.   has a past medical history of Anxiety, History of miscarriage, and Irritable bowel syndrome.  ROS:  Review of Systems  Constitutional:  Positive for fever.  See Subjective/HPI Medications, Allergies and Problem List personally reviewed in Epic today OBJECTIVE:   Vitals:   01/22/21 1205  BP: 108/76  Pulse: (!) 101  Resp: 16  Temp: 98.1 F (36.7 C)  SpO2: 98%    Physical Exam   General: Appears well-developed and well-nourished. No acute distress.  HEENT Head: Normocephalic and atraumatic.   Ears: Hearing grossly intact, no drainage or visible deformity.  Nose: No nasal deviation.   Mouth/Throat: No stridor or tracheal deviation.  Non erythematous posterior pharynx noted with clear drainage present.  No white patchy exudate noted. Eyes: Conjunctivae and EOM are normal. No eye drainage or scleral icterus bilaterally.  Neck: Normal range of motion, neck is supple. No cervical, tonsillar or submandibular lymph nodes palpated.   Cardiovascular: Normal rate. Regular rhythm; no murmurs, gallops, or rubs.  Pulm/Chest: No respiratory distress. Breath sounds normal bilaterally without wheezes, rhonchi, or rales.  Neurological: Alert and oriented to person, place, and time.  Skin: Skin  is warm and dry.  No rashes, lesions, abrasions or bruising noted to skin.   Psychiatric: Normal mood, affect, behavior, and thought content.   Vital signs and nursing note reviewed.   Patient stable and cooperative with examination. PROCEDURES:    LABS/X-RAYS/EKG/MEDS:   No results found for any visits on 01/22/21.  MEDICAL DECISION MAKING:   Patient presents with fever, headache, body aches that started yesterday.  Patient reports a maximum temperature of 101 F.  Patient reports that she was seen on 9/15 for the same issues.  Patient reports that they had since resolved, but returned yesterday.  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  She reports the use of Tylenol, Claritin. Patient does not report any shortness of breath, chest pain, palpitations, visual changes, weakness, tingling, nausea, vomiting, diarrhea, chills.  Given symptoms along with assessment findings, likely viral illness.  Did test for COVID-19 and influenza today.  Patient reports that she is currently [redacted] weeks pregnant.  Did advise of medications that are safe to use over-the-counter during pregnancy which include Mucinex, Robitussin, throat lozenges, Tylenol, Flonase, saline nasal sprays, Chloraseptic spray.  Advised to rest and push fluids.  Work note provided.  Return as needed.  Patient verbalized understanding and agreed with treatment plan.  Patient stable upon discharge. ASSESSMENT/PLAN:  1. Viral illness - Covid-19, Flu A+B ([facility]); Standing - Covid-19, Flu A+B ([facility])  Plan:   Discharge Instructions      We will call you with any positive results from your COVID-19/Influenza testing completed in clinic today.  If  you do not receive a phone call from [place] within the next 2-3 days, check your MyChart for up-to-date health information related to testing completed in clinic today.  Your symptoms today are consistent with a viral  illness. -Rest -Increase fluids  Given your pregnancy status you may use these medications which can be purchased over the counter in the smallest dosage as recommended on the package.  -Mucinex -Robitussin -Throat lozenges -Tylenol -Chloroseptic spray -Saline nasal spray or Trude Mcburney, FNP 01/22/21 1226

## 2021-01-22 NOTE — Discharge Instructions (Addendum)
We will call you with any positive results from your COVID-19/Influenza testing completed in clinic today.  If you do not receive a phone call from [place] within the next 2-3 days, check your MyChart for up-to-date health information related to testing completed in clinic today.  Your symptoms today are consistent with a viral illness. -Rest -Increase fluids  Given your pregnancy status you may use these medications which can be purchased over the counter in the smallest dosage as recommended on the package.  -Mucinex -Robitussin -Throat lozenges -Tylenol -Chloroseptic spray -Saline nasal spray or Flonase

## 2021-01-24 LAB — COVID-19, FLU A+B NAA
Influenza A, NAA: NOT DETECTED
Influenza B, NAA: NOT DETECTED
SARS-CoV-2, NAA: NOT DETECTED

## 2021-01-26 ENCOUNTER — Ambulatory Visit
Admission: RE | Admit: 2021-01-26 | Discharge: 2021-01-26 | Disposition: A | Discharge status: Home / Self Care | Payer: BC Managed Care – PPO | Source: Ambulatory Visit | Attending: Emergency Medicine | Admitting: Emergency Medicine

## 2021-01-26 ENCOUNTER — Other Ambulatory Visit: Payer: Self-pay

## 2021-01-26 VITALS — BP 119/87 | HR 117 | Temp 98.4°F | Resp 22

## 2021-01-26 DIAGNOSIS — B349 Viral infection, unspecified: Secondary | ICD-10-CM

## 2021-01-26 MED ORDER — ALBUTEROL SULFATE HFA 108 (90 BASE) MCG/ACT IN AERS: 1.0000 | INHALATION_SPRAY | Freq: Four times a day (QID) | RESPIRATORY_TRACT | 0 refills | Status: DC | PRN

## 2021-01-26 NOTE — ED Triage Notes (Signed)
Pt here with cough and nasal congestion since Weds. Flu/Covid PCR came in negative, but sx are getting worse.

## 2021-01-26 NOTE — Discharge Instructions (Addendum)
Use your albuterol inhaler as prescribed.  For most people this is a self-limiting process and can take anywhere from 7 - 10 days to start feeling better. A cough can last up to 3 weeks. Pay special attention to handwashing as this can help prevent the spread of the virus.   Always read the labels of cough and cold medications as they may contain some of the ingredients below.  Rest, push lots of fluids (especially water), and utilize supportive care for symptoms. You may take acetaminophen (Tylenol) every 4-6 hours as needed. You may continue to take Robitussin. Mucinex (guaifenesin) may be taken over the counter for cough as needed can loosen phlegm. Please read the instructions and take as directed.  Flonase nasal spray can help alleviate congestion and sinus pressure. Saline nasal sprays or rinses can also help nasal congestion (use bottled or sterile water). Warm tea with lemon and honey can sooth sore throat and cough, as can cough drops.   Return to clinic for high fever not improving with medications, chest pain, difficulty breathing, non-stop vomiting, or coughing blood. Follow-up with your primary care provider or OBGYN if symptoms do not improve as expected in the next 5-7 days.

## 2021-01-26 NOTE — ED Provider Notes (Signed)
CHIEF COMPLAINT:   Chief Complaint  Patient presents with   Cough   Nasal Congestion     SUBJECTIVE/HPI:   Cough A very pleasant 30 y.o.Female presents today with cough and nasal congestion that started on Wednesday.  Patient is [redacted] weeks pregnant.  Patient reports that symptoms are worsening and she feels short of breath sometimes. Patient does not report any shortness of breath, chest pain, palpitations, visual changes, weakness, tingling, headache, nausea, vomiting, diarrhea, fever, chills.   has a past medical history of Anxiety, History of miscarriage, and Irritable bowel syndrome.  ROS:  Review of Systems  Respiratory:  Positive for cough.   See Subjective/HPI Medications, Allergies and Problem List personally reviewed in Epic today OBJECTIVE:   Vitals:   01/26/21 1308  BP: 119/87  Pulse: (!) 117  Resp: (!) 22  Temp: 98.4 F (36.9 C)  SpO2: 97%    Physical Exam   General: Appears well-developed and well-nourished. No acute distress.  HEENT Head: Normocephalic and atraumatic. Ears: Hearing grossly intact, no drainage or visible deformity.  Nose: No nasal deviation.   Mouth/Throat: No stridor or tracheal deviation.  Mildly erythematous posterior pharynx noted with clear drainage present.  No white patchy exudate noted. Eyes: Conjunctivae and EOM are normal. No eye drainage or scleral icterus bilaterally.  Neck: Normal range of motion, neck is supple.  Cardiovascular: Normal rate. Regular rhythm; no murmurs, gallops, or rubs.  Pulm/Chest: No respiratory distress. Breath sounds normal bilaterally without wheezes, rhonchi, or rales.  Intermittent forceful cough noted. Neurological: Alert and oriented to person, place, and time.  Skin: Skin is warm and dry.  No rashes, lesions, abrasions or bruising noted to skin.   Psychiatric: Normal mood, affect, behavior, and thought content.  Vital signs and nursing note reviewed.   Patient stable and cooperative with  examination. PROCEDURES:    LABS/X-RAYS/EKG/MEDS:   No results found for any visits on 01/26/21.  MEDICAL DECISION MAKING:   Patient presents with cough and nasal congestion that started on Wednesday.  Patient is [redacted] weeks pregnant.  Patient reports that symptoms are worsening. Patient does not report any shortness of breath, chest pain, palpitations, visual changes, weakness, tingling, headache, nausea, vomiting, diarrhea, fever, chills.  Given symptoms along with assessment findings likely continued viral illness.  Did Rx an albuterol inhaler to the patient's preferred pharmacy.  I do not suspect any underlying bacterial etiology at this time.  Advised to push fluids, rest, take Tylenol, Mucinex and continued use of Robitussin.  Advised to follow-up with her PCP or OB/GYN if symptoms do not improve as expected in the next 5 to 7 days.  Return to clinic for any new high fever not improving with medications, chest pain, difficulty breathing, nonstop vomiting or coughing up blood.  Patient verbalized understanding and agreed with treatment plan.  Patient stable upon discharge. ASSESSMENT/PLAN:  1. Viral illness - albuterol (VENTOLIN HFA) 108 (90 Base) MCG/ACT inhaler; Inhale 1-2 puffs into the lungs every 6 (six) hours as needed (cough).  Dispense: 6.7 g; Refill: 0 Instructions about new medications and side effects provided.  Plan:   Discharge Instructions      Use your albuterol inhaler as prescribed.  For most people this is a self-limiting process and can take anywhere from 7 - 10 days to start feeling better. A cough can last up to 3 weeks. Pay special attention to handwashing as this can help prevent the spread of the virus.   Always read the labels of cough  and cold medications as they may contain some of the ingredients below.  Rest, push lots of fluids (especially water), and utilize supportive care for symptoms. You may take acetaminophen (Tylenol) every 4-6 hours as  needed. You may continue to take Robitussin. Mucinex (guaifenesin) may be taken over the counter for cough as needed can loosen phlegm. Please read the instructions and take as directed.  Flonase nasal spray can help alleviate congestion and sinus pressure. Saline nasal sprays or rinses can also help nasal congestion (use bottled or sterile water). Warm tea with lemon and honey can sooth sore throat and cough, as can cough drops.   Return to clinic for high fever not improving with medications, chest pain, difficulty breathing, non-stop vomiting, or coughing blood. Follow-up with your primary care provider or OBGYN if symptoms do not improve as expected in the next 5-7 days.          Amalia Greenhouse, [place] 01/26/21 1337

## 2021-01-29 ENCOUNTER — Encounter: Payer: BC Managed Care – PPO | Admitting: Obstetrics & Gynecology

## 2021-01-29 ENCOUNTER — Encounter: Payer: Self-pay | Admitting: Obstetrics & Gynecology

## 2021-01-29 ENCOUNTER — Other Ambulatory Visit (HOSPITAL_COMMUNITY)
Admission: RE | Admit: 2021-01-29 | Discharge: 2021-01-29 | Disposition: A | Discharge status: Home / Self Care | Payer: BC Managed Care – PPO | Source: Ambulatory Visit | Attending: Obstetrics & Gynecology | Admitting: Obstetrics & Gynecology

## 2021-01-29 ENCOUNTER — Other Ambulatory Visit: Payer: Self-pay

## 2021-01-29 ENCOUNTER — Ambulatory Visit (INDEPENDENT_AMBULATORY_CARE_PROVIDER_SITE_OTHER): Payer: BC Managed Care – PPO | Admitting: Obstetrics & Gynecology

## 2021-01-29 VITALS — BP 100/60 | Wt 167.0 lb

## 2021-01-29 DIAGNOSIS — Z131 Encounter for screening for diabetes mellitus: Secondary | ICD-10-CM

## 2021-01-29 DIAGNOSIS — Z3491 Encounter for supervision of normal pregnancy, unspecified, first trimester: Secondary | ICD-10-CM | POA: Diagnosis present

## 2021-01-29 DIAGNOSIS — Z3403 Encounter for supervision of normal first pregnancy, third trimester: Secondary | ICD-10-CM | POA: Insufficient documentation

## 2021-01-29 DIAGNOSIS — O99210 Obesity complicating pregnancy, unspecified trimester: Secondary | ICD-10-CM | POA: Insufficient documentation

## 2021-01-29 DIAGNOSIS — Z1379 Encounter for other screening for genetic and chromosomal anomalies: Secondary | ICD-10-CM

## 2021-01-29 DIAGNOSIS — O99211 Obesity complicating pregnancy, first trimester: Secondary | ICD-10-CM

## 2021-01-29 DIAGNOSIS — N96 Recurrent pregnancy loss: Secondary | ICD-10-CM

## 2021-01-29 DIAGNOSIS — O0990 Supervision of high risk pregnancy, unspecified, unspecified trimester: Secondary | ICD-10-CM | POA: Insufficient documentation

## 2021-01-29 DIAGNOSIS — Z3A08 8 weeks gestation of pregnancy: Secondary | ICD-10-CM

## 2021-01-29 NOTE — Progress Notes (Signed)
01/29/2021   Chief Complaint: Missed period  History of Present Illness: [name] is a 30 y.o. G4P0030 [redacted]w[redacted]d based on Patient's last menstrual period was 11/29/2020. with an Estimated Date of Delivery: 09/05/21, with the above CC.   Her periods were: regular periods every 28 days She was using no method when she conceived.  She has Positive signs or symptoms of nausea/vomiting of pregnancy. She has Negative signs or symptoms of miscarriage or preterm labor She identifies Negative Zika risk factors for her and her partner On any different medications around the time she conceived/early pregnancy: Yes  - Lexapro History of varicella: Yes   ROS: A 12-point review of systems was performed and negative, except as stated in the above HPI.  OBGYN History: As per HPI. OB History  Gravida Para Term Preterm AB Living  4       3    SAB IAB Ectopic Multiple Live Births  3            # Outcome Date GA Lbr Len/2nd Weight Sex Delivery Anes PTL Lv  4 Current           3 SAB 02/04/20          2 SAB 11/2019          1 SAB 10/2017            Any issues with any prior pregnancies: not applicable Any prior children are healthy, doing well, without any problems or issues: not applicable History of pap smears: Yes. Last pap smear 03/2020. Abnormal: no  History of STIs: No   Past Medical History: Past Medical History:  Diagnosis Date   Anxiety    History of miscarriage    Irritable bowel syndrome     Past Surgical History: Past Surgical History:  Procedure Laterality Date   COLONOSCOPY WITH PROPOFOL N/A 08/13/2020   Procedure: COLONOSCOPY WITH PROPOFOL;  Surgeon: [name], MD;  Location: [facility] ENDOSCOPY;  Service: Gastroenterology;  Laterality: N/A;   ESOPHAGOGASTRODUODENOSCOPY (EGD) WITH PROPOFOL N/A 08/13/2020   Procedure: ESOPHAGOGASTRODUODENOSCOPY (EGD) WITH PROPOFOL;  Surgeon: [name], MD;  Location: [facility] ENDOSCOPY;  Service: Gastroenterology;  Laterality: N/A;     Family History:  Family History  Problem Relation Age of Onset   Hypertension Mother    Diabetes Father    She denies any female cancers, bleeding or blood clotting disorders.  She denies any history of mental retardation, birth defects or genetic disorders in her or the FOB's history  Social History:  Social History   Socioeconomic History   Marital status: Married    Spouse name: Not on file   Number of children: Not on file   Years of education: Not on file   Highest education level: Not on file  Occupational History   Not on file  Tobacco Use   Smoking status: Never   Smokeless tobacco: Never  Vaping Use   Vaping Use: Never used  Substance and Sexual Activity   Alcohol use: Not Currently   Drug use: Never   Sexual activity: Yes    Birth control/protection: None  Other Topics Concern   Not on file  Social History Narrative   Not on file   Social Determinants of Health   Financial Resource Strain: Not on file  Food Insecurity: Not on file  Transportation Needs: Not on file  Physical Activity: Not on file  Stress: Not on file  Social Connections: Not on file  Intimate Partner Violence: Not  on file   Any pets in the household: yes Dog, Fish  Allergy: Allergies  Allergen Reactions   Penicillins Diarrhea and Nausea And Vomiting    Current Outpatient Medications:  Current Outpatient Medications:    albuterol (VENTOLIN HFA) 108 (90 Base) MCG/ACT inhaler, Inhale 1-2 puffs into the lungs every 6 (six) hours as needed (cough)., Disp: 6.7 g, Rfl: 0   dicyclomine (BENTYL) 20 MG tablet, Take 1 tablet (20 mg total) by mouth 3 (three) times daily before meals., Disp: 30 tablet, Rfl: 0   escitalopram (LEXAPRO) 20 MG tablet, Take 20 mg by mouth daily., Disp: , Rfl:    ondansetron (ZOFRAN ODT) 4 MG disintegrating tablet, Take 1 tablet (4 mg total) by mouth every 8 (eight) hours as needed for nausea or vomiting., Disp: 20 tablet, Rfl: 0   Prenatal Vit-Fe Fumarate-FA  (PNV PRENATAL PLUS MULTIVITAMIN) 27-1 MG TABS, Take 1 tablet by mouth daily., Disp: , Rfl:    PROGESTERONE, VAGINAL, 4 % GEL, Place 1 Applicatorful vaginally at bedtime for 30 doses., Disp: 33.75 g, Rfl: 0   omeprazole (PRILOSEC) 40 MG capsule, Take 1 capsule (40 mg total) by mouth daily before breakfast., Disp: 30 capsule, Rfl: 2   Physical Exam:   BP 100/60   Wt 167 lb (75.8 kg)   LMP 11/29/2020   BMI 32.61 kg/m  Body mass index is 32.61 kg/m. Constitutional: Well nourished, well developed female in no acute distress.  Neck:  Supple, normal appearance, and no thyromegaly  Cardiovascular: S1, S2 normal, no murmur, rub or gallop, regular rate and rhythm Respiratory:  Clear to auscultation bilateral. Normal respiratory effort Abdomen: positive bowel sounds and no masses, hernias; diffusely non tender to palpation, non distended Breasts: breasts appear normal, no suspicious masses, no skin or nipple changes or axillary nodes. Neuro/Psych:  Normal mood and affect.  Skin:  Warm and dry.  Lymphatic:  No inguinal lymphadenopathy.   Pelvic exam: is not limited by body habitus EGBUS: within normal limits, Vagina: within normal limits and with no blood in the vault, Cervix: normal appearing cervix without discharge or lesions, closed/long/high, Uterus:  enlarged: 8 weeks, and Adnexa:  normal adnexa  Assessment: Dominique Vega is a 30 y.o. G4P0030 [redacted]w[redacted]d based on Patient's last menstrual period was 11/29/2020. with an Estimated Date of Delivery: 09/05/21,  for prenatal care.  Plan:  1) Avoid alcoholic beverages. 2) Patient encouraged not to smoke.  3) Discontinue the use of all non-medicinal drugs and chemicals.  4) Take prenatal vitamins daily.  5) Seatbelt use advised 6) Nutrition, food safety (fish, cheese advisories, and high nitrite foods) and exercise discussed. 7) Hospital and practice style delivering at [facility] discussed  8) Patient is asked about travel to areas at risk for the Zika virus,  and counseled to avoid travel and exposure to mosquitoes or sexual partners who may have themselves been exposed to the virus. Testing is discussed, and will be ordered as appropriate.  9) Childbirth classes at [facility] advised 10) Genetic Screening, such as with 1st Trimester Screening, cell free fetal DNA, AFP testing, and Ultrasound, as well as with amniocentesis and CVS as appropriate, is discussed with patient. She plans to have genetic testing this pregnancy. 11) Early glucola due to BMI >30 Low risk otherwise (<40) 12) Discussed risks of recurrent Ab Was unable to get vag progesterone therapy Does not desire use this any longer 13) Discussed Lexapro, pros and cons.  DId not do well w Zoloft in past.  Plan to cont  Lexapro, risks of anxiety>risks of side effects or fetal effects.  Problem list reviewed and updated.  Annamarie Major, MD, [facility], [facility] 01/29/2021  2:49 PM

## 2021-01-29 NOTE — Patient Instructions (Signed)

## 2021-02-02 LAB — GC/CHLAMYDIA PROBE AMP (~~LOC~~) NOT AT ARMC
Chlamydia: NEGATIVE
Comment: NEGATIVE
Comment: NORMAL
Neisseria Gonorrhea: NEGATIVE

## 2021-02-02 LAB — URINE CULTURE

## 2021-02-04 ENCOUNTER — Encounter: Payer: BC Managed Care – PPO | Admitting: Advanced Practice Midwife

## 2021-02-06 NOTE — Telephone Encounter (Signed)
Can you send in a different rx? 

## 2021-02-07 ENCOUNTER — Other Ambulatory Visit: Payer: Self-pay | Admitting: Advanced Practice Midwife

## 2021-02-07 ENCOUNTER — Ambulatory Visit: Payer: BC Managed Care – PPO

## 2021-02-07 DIAGNOSIS — O219 Vomiting of pregnancy, unspecified: Secondary | ICD-10-CM

## 2021-02-07 MED ORDER — PROMETHAZINE HCL 12.5 MG PO TABS: 12.5000 mg | ORAL_TABLET | Freq: Three times a day (TID) | ORAL | 0 refills | Status: DC | PRN

## 2021-02-07 NOTE — Progress Notes (Signed)
Phenergan Rx sent. Message to patient with other suggestions to treat nausea/constipation.

## 2021-02-12 ENCOUNTER — Other Ambulatory Visit: Payer: Self-pay

## 2021-02-12 ENCOUNTER — Ambulatory Visit (INDEPENDENT_AMBULATORY_CARE_PROVIDER_SITE_OTHER): Payer: BC Managed Care – PPO | Admitting: Obstetrics

## 2021-02-12 ENCOUNTER — Other Ambulatory Visit: Payer: Self-pay | Admitting: Obstetrics & Gynecology

## 2021-02-12 ENCOUNTER — Other Ambulatory Visit: Payer: BC Managed Care – PPO

## 2021-02-12 VITALS — BP 118/74 | Wt 168.0 lb

## 2021-02-12 DIAGNOSIS — Z3491 Encounter for supervision of normal pregnancy, unspecified, first trimester: Secondary | ICD-10-CM

## 2021-02-12 DIAGNOSIS — Z1379 Encounter for other screening for genetic and chromosomal anomalies: Secondary | ICD-10-CM

## 2021-02-12 DIAGNOSIS — Z131 Encounter for screening for diabetes mellitus: Secondary | ICD-10-CM

## 2021-02-12 DIAGNOSIS — Z3A10 10 weeks gestation of pregnancy: Secondary | ICD-10-CM

## 2021-02-12 NOTE — Progress Notes (Signed)
  Routine Prenatal Care Visit  Subjective  Dominique Vega is a 30 y.o. G4P0030 at [redacted]w[redacted]d being seen today for ongoing prenatal care.  She is currently monitored for the following issues for this high-risk pregnancy and has Recurrent UTI (urinary tract infection); Pyelonephritis; Fever; Abdominal bloating; Diarrhea; Supervision of low-risk pregnancy, first trimester; and Pregnancy complicated by obesity, first trimester on their problem list.  ----------------------------------------------------------------------------------- Patient reports no complaints.  She continues using Zfran for nausea and vomiting and it helps. No bleeding or cramping.Having her blood work today, Photographer T Contractions: Not present. Vag. Bleeding: None.  Movement: Absent. Leaking Fluid denies.  ----------------------------------------------------------------------------------- The following portions of the patient's history were reviewed and updated as appropriate: allergies, current medications, past family history, past medical history, past social history, past surgical history and problem list. Problem list updated.  Objective  Blood pressure 118/74, weight 168 lb (76.2 kg), last menstrual period 11/29/2020. Pregravid weight 167 lb (75.8 kg) Total Weight Gain 1 lb (0.454 kg) Urinalysis: Urine Protein    Urine Glucose    Fetal Status:     Movement: Absent     General:  Alert, oriented and cooperative. Patient is in no acute distress.  Skin: Skin is warm and dry. No rash noted.   Cardiovascular: Normal heart rate noted  Respiratory: Normal respiratory effort, no problems with respiration noted  Abdomen: Soft, gravid, appropriate for gestational age. Pain/Pressure: Absent     Pelvic:  Cervical exam deferred        Extremities: Normal range of motion.  Edema: None  Mental Status: Normal mood and affect. Normal behavior. Normal judgment and thought content.   Assessment   30 y.o. G4P0030 at [redacted]w[redacted]d by   09/05/2021, by Last Menstrual Period presenting for routine prenatal visit  Plan   pregnancy4  Problems (from 01/29/21 to present)    Problem Noted Resolved   Supervision of low-risk pregnancy, first trimester 01/29/2021 by Nadara Mustard, MD No   Overview Addendum 02/12/2021  3:24 PM by Mirna Mires, CNM     Nursing Staff Provider  Office Location  [facility] Dating  LMP and [place]  Language  English Anatomy US    Flu Vaccine   Genetic Screen  NIPS:   TDaP vaccine    Hgb A1C or  GTT Early : Third trimester :   Covid UTD   LAB RESULTS   Rhogam  N/A Blood Type A/Positive/-- (08/29 1630)   Feeding Plan  Antibody    Contraception  Rubella    Circumcision  RPR     Pediatrician   HBsAg     Support Person  HIV    Prenatal Classes  Varicella     GBS  (For PCN allergy, check sensitivities)   BTL Consent No    VBAC Consent N/A Pap  03/2020  BMI 32      Pregnancy complicated by obesity, first trimester 01/29/2021 by Nadara Mustard, MD No       Preterm labor symptoms and general obstetric precautions including but not limited to vaginal bleeding, contractions, leaking of fluid and fetal movement were reviewed in detail with the patient. Please refer to After Visit Summary for other counseling recommendations.   Return in about 4 weeks (around 03/12/2021) for return OB.  Early 1hr GTT today as well.  Mirna Mires, CNM  02/12/2021 3:27 PM

## 2021-02-12 NOTE — Progress Notes (Signed)
Early glucose today. No vb. No lof.  

## 2021-02-13 LAB — RPR+RH+ABO+RUB AB+AB SCR+CB...
Antibody Screen: NEGATIVE
HIV Screen 4th Generation wRfx: NONREACTIVE
Hematocrit: 35.2 % (ref 34.0–46.6)
Hemoglobin: 12.1 g/dL (ref 11.1–15.9)
Hepatitis B Surface Ag: NEGATIVE
MCH: 30.3 pg (ref 26.6–33.0)
MCHC: 34.4 g/dL (ref 31.5–35.7)
MCV: 88 fL (ref 79–97)
Platelets: 399 10*3/uL (ref 150–450)
RBC: 3.99 x10E6/uL (ref 3.77–5.28)
RDW: 12.8 % (ref 11.7–15.4)
RPR Ser Ql: NONREACTIVE
Rh Factor: POSITIVE
Rubella Antibodies, IGG: <0.9 index — ABNORMAL LOW (ref >0.99)
Varicella zoster IgG: 2056 index (ref >165)
WBC: 13.5 10*3/uL — ABNORMAL HIGH (ref 3.4–10.8)

## 2021-02-13 LAB — GLUCOSE, 1 HOUR GESTATIONAL: Gestational Diabetes Screen: 120 mg/dL (ref 70–139)

## 2021-02-17 LAB — MATERNIT21 PLUS CORE+SCA
Fetal Fraction: 8
Monosomy X (Turner Syndrome): NOT DETECTED
Result (T21): NEGATIVE
Trisomy 13 (Patau syndrome): NEGATIVE
Trisomy 18 (Edwards syndrome): NEGATIVE
Trisomy 21 (Down syndrome): NEGATIVE
XXX (Triple X Syndrome): NOT DETECTED
XXY (Klinefelter Syndrome): NOT DETECTED
XYY (Jacobs Syndrome): NOT DETECTED

## 2021-02-23 ENCOUNTER — Ambulatory Visit (INDEPENDENT_AMBULATORY_CARE_PROVIDER_SITE_OTHER): Payer: BC Managed Care – PPO | Admitting: Obstetrics & Gynecology

## 2021-02-23 ENCOUNTER — Other Ambulatory Visit: Payer: Self-pay

## 2021-02-23 ENCOUNTER — Encounter: Payer: Self-pay | Admitting: Obstetrics & Gynecology

## 2021-02-23 VITALS — BP 120/80 | Wt 169.0 lb

## 2021-02-23 DIAGNOSIS — O209 Hemorrhage in early pregnancy, unspecified: Secondary | ICD-10-CM

## 2021-02-23 NOTE — Progress Notes (Signed)
  Obstetric Problem Visit    Chief Complaint  Patient presents with   Vaginal Bleeding    History of Present Illness: Patient is a 30 y.o. [id number] [redacted]w[redacted]d presenting for first trimester bleeding.  The onset of bleeding was this past weekend.  She has had earlier bleeding but not in a while.  Pink, resolved by today. Rh status: POS  PMHx: She  has a past medical history of Anxiety, History of miscarriage, and Irritable bowel syndrome. Also,  has a past surgical history that includes Colonoscopy with propofol (N/A, 08/13/2020) and Esophagogastroduodenoscopy (egd) with propofol (N/A, 08/13/2020)., family history includes Diabetes in her father; Hypertension in her mother.,  reports that she has never smoked. She has never used smokeless tobacco. She reports that she does not currently use alcohol. She reports that she does not use drugs.  She has a current medication list which includes the following prescription(s): albuterol, dicyclomine, escitalopram, omeprazole, ondansetron, pnv prenatal plus multivitamin, and promethazine. Also, is allergic to penicillins.  Review of Systems  All other systems reviewed and are negative.  Objective: Vitals:   02/23/21 1623  BP: 120/80   Physical Exam Constitutional:      General: She is not in acute distress.    Appearance: She is well-developed.  Musculoskeletal:        General: Normal range of motion.  Neurological:     Mental Status: She is alert and oriented to person, place, and time.  Skin:    General: Skin is warm and dry.  Vitals reviewed.   [place]- FHT 140s, good FM, anterior placenta and also low lying  Assessment: 30 y.o. G4P0030 [redacted]w[redacted]d 1. First trimester bleeding  Plan: Reassuring [place] today, w FHTs 140s and ant placenta without obvious bleed noted  Routine bleeding precautions were discussed with the patient prior the conclusion of today's visit. If continues, will monitor w serial US/ exam  Annamarie Major, MD, [facility],  [facility] 02/23/2021  5:08 PM

## 2021-02-25 ENCOUNTER — Other Ambulatory Visit: Payer: Self-pay

## 2021-02-25 ENCOUNTER — Emergency Department: Payer: BC Managed Care – PPO

## 2021-02-25 ENCOUNTER — Emergency Department
Admission: EM | Admit: 2021-02-25 | Discharge: 2021-02-25 | Disposition: A | Discharge status: Home / Self Care | Payer: BC Managed Care – PPO | Attending: Emergency Medicine | Admitting: Emergency Medicine

## 2021-02-25 DIAGNOSIS — N938 Other specified abnormal uterine and vaginal bleeding: Secondary | ICD-10-CM | POA: Insufficient documentation

## 2021-02-25 DIAGNOSIS — R519 Headache, unspecified: Secondary | ICD-10-CM | POA: Insufficient documentation

## 2021-02-25 DIAGNOSIS — R350 Frequency of micturition: Secondary | ICD-10-CM | POA: Diagnosis not present

## 2021-02-25 DIAGNOSIS — R109 Unspecified abdominal pain: Secondary | ICD-10-CM

## 2021-02-25 DIAGNOSIS — O26891 Other specified pregnancy related conditions, first trimester: Secondary | ICD-10-CM | POA: Diagnosis not present

## 2021-02-25 DIAGNOSIS — Z3A12 12 weeks gestation of pregnancy: Secondary | ICD-10-CM | POA: Diagnosis not present

## 2021-02-25 DIAGNOSIS — N3090 Cystitis, unspecified without hematuria: Secondary | ICD-10-CM | POA: Insufficient documentation

## 2021-02-25 DIAGNOSIS — R1031 Right lower quadrant pain: Secondary | ICD-10-CM | POA: Insufficient documentation

## 2021-02-25 DIAGNOSIS — N9489 Other specified conditions associated with female genital organs and menstrual cycle: Secondary | ICD-10-CM | POA: Insufficient documentation

## 2021-02-25 DIAGNOSIS — R110 Nausea: Secondary | ICD-10-CM | POA: Diagnosis not present

## 2021-02-25 DIAGNOSIS — O208 Other hemorrhage in early pregnancy: Secondary | ICD-10-CM | POA: Insufficient documentation

## 2021-02-25 DIAGNOSIS — R1030 Lower abdominal pain, unspecified: Secondary | ICD-10-CM

## 2021-02-25 LAB — POC URINE PREG, ED: Preg Test, Ur: POSITIVE — AB

## 2021-02-25 LAB — COMPREHENSIVE METABOLIC PANEL
ALT: 16 U/L (ref 0–44)
AST: 19 U/L (ref 15–41)
Albumin: 3.8 g/dL (ref 3.5–5.0)
Alkaline Phosphatase: 45 U/L (ref 38–126)
Anion gap: 6 (ref 5–15)
BUN: 6 mg/dL (ref 6–20)
CO2: 19 mmol/L — ABNORMAL LOW (ref 22–32)
Calcium: 9.1 mg/dL (ref 8.9–10.3)
Chloride: 108 mmol/L (ref 98–111)
Creatinine, Ser: 0.45 mg/dL (ref 0.44–1.00)
GFR, Estimated: >60 mL/min (ref >60)
Glucose, Bld: 105 mg/dL — ABNORMAL HIGH (ref 70–99)
Potassium: 3.8 mmol/L (ref 3.5–5.1)
Sodium: 133 mmol/L — ABNORMAL LOW (ref 135–145)
Total Bilirubin: 0.4 mg/dL (ref 0.3–1.2)
Total Protein: 7.2 g/dL (ref 6.5–8.1)

## 2021-02-25 LAB — CBC WITH DIFFERENTIAL/PLATELET
Abs Immature Granulocytes: 0.04 10*3/uL (ref 0.00–0.07)
Basophils Absolute: 0 10*3/uL (ref 0.0–0.1)
Basophils Relative: 0 %
Eosinophils Absolute: 0.6 10*3/uL — ABNORMAL HIGH (ref 0.0–0.5)
Eosinophils Relative: 5 %
HCT: 36.8 % (ref 36.0–46.0)
Hemoglobin: 13 g/dL (ref 12.0–15.0)
Immature Granulocytes: 0 %
Lymphocytes Relative: 16 %
Lymphs Abs: 1.8 10*3/uL (ref 0.7–4.0)
MCH: 31 pg (ref 26.0–34.0)
MCHC: 35.3 g/dL (ref 30.0–36.0)
MCV: 87.6 fL (ref 80.0–100.0)
Monocytes Absolute: 0.4 10*3/uL (ref 0.1–1.0)
Monocytes Relative: 3 %
Neutro Abs: 8.5 10*3/uL — ABNORMAL HIGH (ref 1.7–7.7)
Neutrophils Relative %: 76 %
Platelets: 328 10*3/uL (ref 150–400)
RBC: 4.2 MIL/uL (ref 3.87–5.11)
RDW: 13.5 % (ref 11.5–15.5)
WBC: 11.3 10*3/uL — ABNORMAL HIGH (ref 4.0–10.5)
nRBC: 0 % (ref 0.0–0.2)

## 2021-02-25 LAB — URINALYSIS, COMPLETE (UACMP) WITH MICROSCOPIC
Bilirubin Urine: NEGATIVE
Glucose, UA: NEGATIVE mg/dL
Hgb urine dipstick: NEGATIVE
Ketones, ur: NEGATIVE mg/dL
Nitrite: NEGATIVE
Protein, ur: NEGATIVE mg/dL
Specific Gravity, Urine: 1.008 (ref 1.005–1.030)
pH: 9 — ABNORMAL HIGH (ref 5.0–8.0)

## 2021-02-25 LAB — HCG, QUANTITATIVE, PREGNANCY: hCG, Beta Chain, Quant, S: 80174 m[IU]/mL — ABNORMAL HIGH (ref <5)

## 2021-02-25 MED ORDER — CEPHALEXIN 500 MG PO CAPS: 500.0000 mg | ORAL_CAPSULE | Freq: Four times a day (QID) | ORAL | 0 refills | Status: DC

## 2021-02-25 MED ORDER — SODIUM CHLORIDE 0.9 % IV SOLN
1.0000 g | Freq: Once | INTRAVENOUS | Status: AC
  Administered 2021-02-25: 1 g via INTRAVENOUS
  Filled 2021-02-25: qty 10

## 2021-02-25 MED ORDER — LACTATED RINGERS IV BOLUS
1000.0000 mL | Freq: Once | INTRAVENOUS | Status: AC
  Administered 2021-02-25: 1000 mL via INTRAVENOUS

## 2021-02-25 MED ORDER — ACETAMINOPHEN 500 MG PO TABS
1000.0000 mg | ORAL_TABLET | Freq: Once | ORAL | Status: AC
  Administered 2021-02-25: 1000 mg via ORAL
  Filled 2021-02-25: qty 2

## 2021-02-25 MED ORDER — PYRIDOXINE HCL 100 MG/ML IJ SOLN
100.0000 mg | Freq: Every day | INTRAMUSCULAR | Status: DC
  Filled 2021-02-25: qty 1

## 2021-02-25 MED ORDER — DOXYLAMINE-PYRIDOXINE 10-10 MG PO TBEC: 1.0000 | DELAYED_RELEASE_TABLET | Freq: Two times a day (BID) | ORAL | 0 refills | Status: AC | PRN

## 2021-02-25 MED ORDER — DOXYLAMINE SUCCINATE (SLEEP) 25 MG PO TABS
25.0000 mg | ORAL_TABLET | ORAL | Status: DC
  Filled 2021-02-25: qty 1

## 2021-02-25 NOTE — ED Notes (Signed)
Pt to US at this time.

## 2021-02-25 NOTE — ED Notes (Signed)
Dc instructions and scripts reviewed with pt no questions or concerns at this time will follow up with ob. Pt ambulated without difficulty out of the department. Refused wheelchair.

## 2021-02-25 NOTE — ED Triage Notes (Signed)
Pt states she is [redacted] weeks pregnant and is having spotting and RLQ pain that started this morning, states she is a pt of [facility] and has had 2 ultrasounds during this pregnancy

## 2021-02-25 NOTE — ED Provider Notes (Signed)
[facility] Emergency Department Provider Note  ____________________________________________   Event Date/Time   First MD Initiated Contact with Patient 02/25/21 1202     (approximate)  I have reviewed the triage vital signs and the nursing notes.   HISTORY  Chief Complaint Vaginal Bleeding   HPI Dominique Vega is a 30 y.o. female without medical history of anxiety, IBS  G4P0030 at approximately [redacted] weeks gestation who presents for assessment of some right lower quadrant abdominal pain that started this morning.  Patient notes she has had a little bit of a headache over the last 3 days as well as several days of increased urinary frequency.  She endorses some intermittent nausea as well with a course of her pregnancy not significantly different today.  She denies any fevers, vision changes, vertigo, chest pain, cough, shortness of breath, burning with urination or abnormal discharge does note she had a little bit of spotting today and 3 days ago.  No injuries or falls.  She states the right lower quadrant Donnell pain reached the back although she does not have any left-sided back pain.  Denies any rashes or extremity pain.  Still has her gallbladder appendix.  She had some Tylenol earlier today.  No other acute concerns at this time.         Past Medical History:  Diagnosis Date   Anxiety    History of miscarriage    Irritable bowel syndrome     Patient Active Problem List   Diagnosis Date Noted   Supervision of low-risk pregnancy, first trimester 01/29/2021   Pregnancy complicated by obesity, first trimester 01/29/2021   Abdominal bloating    Diarrhea    Recurrent UTI (urinary tract infection) 01/26/2019   Pyelonephritis 01/25/2019   Fever 05/09/2018    Past Surgical History:  Procedure Laterality Date   COLONOSCOPY WITH PROPOFOL N/A 08/13/2020   Procedure: COLONOSCOPY WITH PROPOFOL;  Surgeon: [name], MD;  Location: [facility] ENDOSCOPY;   Service: Gastroenterology;  Laterality: N/A;   ESOPHAGOGASTRODUODENOSCOPY (EGD) WITH PROPOFOL N/A 08/13/2020   Procedure: ESOPHAGOGASTRODUODENOSCOPY (EGD) WITH PROPOFOL;  Surgeon: [name], MD;  Location: [facility] ENDOSCOPY;  Service: Gastroenterology;  Laterality: N/A;    Prior to Admission medications   Medication Sig Start Date End Date Taking? Authorizing Provider  cephALEXin (KEFLEX) 500 MG capsule Take 1 capsule (500 mg total) by mouth 4 (four) times daily for 7 days. 02/25/21 03/04/21 Yes Gilles Chiquito, MD  Doxylamine-Pyridoxine (DICLEGIS) 10-10 MG TBEC Take 1 tablet by mouth 2 (two) times daily as needed for up to 7 days. 02/25/21 03/04/21 Yes Gilles Chiquito, MD  albuterol (VENTOLIN HFA) 108 (90 Base) MCG/ACT inhaler Inhale 1-2 puffs into the lungs every 6 (six) hours as needed (cough). 01/26/21   Boddu, Belenda Cruise, FNP  dicyclomine (BENTYL) 20 MG tablet Take 1 tablet (20 mg total) by mouth 3 (three) times daily before meals. 06/19/20   Ivette Loyal, NP  escitalopram (LEXAPRO) 20 MG tablet Take 20 mg by mouth daily. 12/12/19   [provider]  omeprazole (PRILOSEC) 40 MG capsule Take 1 capsule (40 mg total) by mouth daily before breakfast. 08/13/20 09/12/20  [name], MD  ondansetron (ZOFRAN ODT) 4 MG disintegrating tablet Take 1 tablet (4 mg total) by mouth every 8 (eight) hours as needed for nausea or vomiting. 01/12/21   Nadara Mustard, MD  Prenatal Vit-Fe Fumarate-FA (PNV PRENATAL PLUS MULTIVITAMIN) 27-1 MG TABS Take 1 tablet by mouth daily.  [provider]  promethazine (PHENERGAN) 12.5 MG tablet Take 1 tablet (12.5 mg total) by mouth every 8 (eight) hours as needed for nausea or vomiting. 02/07/21   Tresea Mall, CNM    Allergies Penicillins  Family History  Problem Relation Age of Onset   Hypertension Mother    Diabetes Father     Social History Social History   Tobacco Use   Smoking status: Never   Smokeless tobacco: Never  Vaping  Use   Vaping Use: Never used  Substance Use Topics   Alcohol use: Not Currently   Drug use: Never    Review of Systems  Review of Systems  Constitutional:  Negative for chills and fever.  HENT:  Negative for sore throat.   Eyes:  Negative for pain.  Respiratory:  Negative for cough and stridor.   Cardiovascular:  Negative for chest pain.  Gastrointestinal:  Positive for abdominal pain and nausea. Negative for vomiting.  Musculoskeletal:  Positive for back pain (R lower).  Skin:  Negative for rash.  Neurological:  Positive for headaches. Negative for seizures and loss of consciousness.  Psychiatric/Behavioral:  Negative for suicidal ideas.   All other systems reviewed and are negative.    ____________________________________________   PHYSICAL EXAM:  VITAL SIGNS: ED Triage Vitals [02/25/21 0957]  Enc Vitals Group     BP 131/84     Pulse Rate (!) 103     Resp 16     Temp 98.5 F (36.9 C)     Temp Source Oral     SpO2 95 %     Weight 168 lb (76.2 kg)     Height 5' (1.524 m)     Head Circumference      Peak Flow      Pain Score 7     Pain Loc      Pain Edu?      Excl. in GC?    Vitals:   02/25/21 0957 02/25/21 1624  BP: 131/84 117/81  Pulse: (!) 103 90  Resp: 16 18  Temp: 98.5 F (36.9 C) 98.3 F (36.8 C)  SpO2: 95% 99%   Physical Exam Vitals and nursing note reviewed.  Constitutional:      General: She is not in acute distress.    Appearance: She is well-developed.  HENT:     Head: Normocephalic and atraumatic.     Right Ear: External ear normal.     Left Ear: External ear normal.     Nose: Nose normal.  Eyes:     Conjunctiva/sclera: Conjunctivae normal.  Cardiovascular:     Rate and Rhythm: Normal rate and regular rhythm.     Heart sounds: No murmur heard. Pulmonary:     Effort: Pulmonary effort is normal. No respiratory distress.     Breath sounds: Normal breath sounds.  Abdominal:     Palpations: Abdomen is soft.     Tenderness: There is  abdominal tenderness in the right lower quadrant. There is no left CVA tenderness.  Musculoskeletal:     Cervical back: Neck supple.  Skin:    General: Skin is warm and dry.  Neurological:     Mental Status: She is alert and oriented to person, place, and time.  Psychiatric:        Mood and Affect: Mood normal.     ____________________________________________   LABS (all labs ordered are listed, but only abnormal results are displayed)  Labs Reviewed  CBC WITH DIFFERENTIAL/PLATELET - Abnormal; Notable for the  following components:      Result Value   WBC 11.3 (*)    Neutro Abs 8.5 (*)    Eosinophils Absolute 0.6 (*)    All other components within normal limits  COMPREHENSIVE METABOLIC PANEL - Abnormal; Notable for the following components:   Sodium 133 (*)    CO2 19 (*)    Glucose, Bld 105 (*)    All other components within normal limits  HCG, QUANTITATIVE, PREGNANCY - Abnormal; Notable for the following components:   hCG, Beta Chain, Quant, S 80,174 (*)    All other components within normal limits  URINALYSIS, COMPLETE (UACMP) WITH MICROSCOPIC - Abnormal; Notable for the following components:   Color, Urine STRAW (*)    APPearance CLEAR (*)    pH 9.0 (*)    Leukocytes,Ua TRACE (*)    Bacteria, UA RARE (*)    All other components within normal limits  POC URINE PREG, ED - Abnormal; Notable for the following components:   Preg Test, Ur POSITIVE (*)    All other components within normal limits  URINE CULTURE   ____________________________________________  EKG  ____________________________________________  RADIOLOGY  ED MD interpretation: Renal ultrasound shows no evidence of abscess or stranding or hydronephrosis.  No stones visualized.  Pelvic OB ultrasound shows IUP approximately 12 weeks 3 days without evidence of hemorrhage or other acute abnormality.  Adnexa are unremarkable..  MR abdomen pelvis shows some mild stranding in the ascending colon without any  evidence of diverticulitis, appendicitis, kidney stone, perinephric stranding, abscess, abnormal adnexa, or other acute abdominal or pelvic process.  Official radiology report(s): MR PELVIS WO CONTRAST  Result Date: 02/25/2021 CLINICAL DATA:  Twelve weeks pregnant, right lower quadrant pain, concern for appendicitis. EXAM: MRI ABDOMEN AND PELVIS WITHOUT CONTRAST TECHNIQUE: Multiplanar multisequence MR imaging of the abdomen and pelvis was performed. No intravenous contrast was administered. COMPARISON:  CT January 23, 2021. FINDINGS: COMBINED FINDINGS FOR BOTH MR ABDOMEN AND PELVIS Lower chest: No acute abnormality. Hepatobiliary: Unremarkable noncontrast appearance of the hepatic parenchyma. Gallbladder is unremarkable. No biliary ductal dilation. Pancreas: Intrinsic T1 signal the pancreatic parenchyma is within normal limits. No pancreatic ductal dilation. Spleen:  Within normal limits. Adrenals/Urinary Tract: Bilateral adrenal glands are unremarkable. No hydronephrosis. Unremarkable noncontrast appearance of the renal parenchyma. Urinary bladder is unremarkable for degree of distension. Stomach/Bowel: Stomach is unremarkable. No pathologic dilation of small or large bowel. A non-decompressed inflamed appendix is well visualized on images 60-65/9. There is mild inflammatory stranding along the ascending colon/cecum. The terminal ileum appears normal. Vascular/Lymphatic: No abdominal aortic aneurysm. No pathologically enlarged abdominal or pelvic lymph nodes. Reproductive: Gravid uterus with a single intrauterine gestation, which is not well evaluated on this non dedicated study. Simple appearing 1.9 cm left ovarian cyst. Other:  Trace pelvic free fluid. Musculoskeletal: No acute osseous abnormality. IMPRESSION: 1. There is mild inflammatory stranding along the ascending colon/cecum, which may reflect a nonspecific inflammatory or infectious segmental colitis. No evidence for bowel obstruction. 2. Normal  appearance of the appendix and terminal ileum. 3. Gravid uterus with a single intrauterine gestation, not well evaluated on this non dedicated study. Electronically Signed   By: Maudry Mayhew M.D.   On: 02/25/2021 15:49   MR ABDOMEN WO CONTRAST  Result Date: 02/25/2021 CLINICAL DATA:  Twelve weeks pregnant, right lower quadrant pain, concern for appendicitis. EXAM: MRI ABDOMEN AND PELVIS WITHOUT CONTRAST TECHNIQUE: Multiplanar multisequence MR imaging of the abdomen and pelvis was performed. No intravenous contrast was administered. COMPARISON:  CT January 23, 2021. FINDINGS: COMBINED FINDINGS FOR BOTH MR ABDOMEN AND PELVIS Lower chest: No acute abnormality. Hepatobiliary: Unremarkable noncontrast appearance of the hepatic parenchyma. Gallbladder is unremarkable. No biliary ductal dilation. Pancreas: Intrinsic T1 signal the pancreatic parenchyma is within normal limits. No pancreatic ductal dilation. Spleen:  Within normal limits. Adrenals/Urinary Tract: Bilateral adrenal glands are unremarkable. No hydronephrosis. Unremarkable noncontrast appearance of the renal parenchyma. Urinary bladder is unremarkable for degree of distension. Stomach/Bowel: Stomach is unremarkable. No pathologic dilation of small or large bowel. A non-decompressed inflamed appendix is well visualized on images 60-65/9. There is mild inflammatory stranding along the ascending colon/cecum. The terminal ileum appears normal. Vascular/Lymphatic: No abdominal aortic aneurysm. No pathologically enlarged abdominal or pelvic lymph nodes. Reproductive: Gravid uterus with a single intrauterine gestation, which is not well evaluated on this non dedicated study. Simple appearing 1.9 cm left ovarian cyst. Other:  Trace pelvic free fluid. Musculoskeletal: No acute osseous abnormality. IMPRESSION: 1. There is mild inflammatory stranding along the ascending colon/cecum, which may reflect a nonspecific inflammatory or infectious segmental colitis. No  evidence for bowel obstruction. 2. Normal appearance of the appendix and terminal ileum. 3. Gravid uterus with a single intrauterine gestation, not well evaluated on this non dedicated study. Electronically Signed   By: Maudry Mayhew M.D.   On: 02/25/2021 15:49   US OB Comp Less 14 Wks  Result Date: 02/25/2021 CLINICAL DATA:  Abdominal pain EXAM: OBSTETRIC <14 WK ULTRASOUND TECHNIQUE: Transabdominal ultrasound was performed for evaluation of the gestation as well as the maternal uterus and adnexal regions. COMPARISON:  None. FINDINGS: Intrauterine gestational sac: Single Yolk sac:  Not Visualized. Embryo:  Visualized. Cardiac Activity: Visualized. Heart Rate: 164 bpm CRL:   60.0 mm   12 w 3 d                  [place] EDC: 09/06/2021 Subchorionic hemorrhage:  None visualized. Maternal uterus/adnexae: Unremarkable. IMPRESSION: Single intrauterine gestation at sonographic gestational age [redacted] weeks, 3 days. EDD 09/06/2021. Fetal heart rate 164 bpm. Electronically Signed   By: Jearld Lesch M.D.   On: 02/25/2021 13:26   US Renal  Result Date: 02/25/2021 CLINICAL DATA:  back pain, urinary frequency EXAM: RENAL / URINARY TRACT ULTRASOUND COMPLETE COMPARISON:  None. FINDINGS: Right Kidney: Renal measurements: 11.7 x 5.1 x 6.0 cm = volume: 186 mL. Echogenicity within normal limits. No mass or hydronephrosis visualized. Left Kidney: Renal measurements: 11.8 x 5.1 x 5.4 cm = volume: 171 mL. Echogenicity within normal limits. No mass or hydronephrosis visualized. Bladder: Appears normal for degree of bladder distention. Bilateral ureteral jets identified. Other: Gravid uterus with fetus partially visualized. IMPRESSION: Normal sonographic appearance of the bilateral kidneys and urinary bladder. No hydronephrosis. Gravid uterus partially visualized. Refer to separate dictation for obstetrics ultrasound dated same day. Electronically Signed   By: Olive Bass M.D.   On: 02/25/2021 13:28     ____________________________________________   PROCEDURES  Procedure(s) performed (including Critical Care):  Procedures   ____________________________________________   INITIAL IMPRESSION / ASSESSMENT AND PLAN / ED COURSE     Patient presents with above-stated history exam for assessment of some right lower quadrant abdominal pain associate with some vaginal spotting in the setting of being approximately 12 weeks 3 days pregnant.  She states that she had some spotting 3 days ago as well and has had a headache over the last couple days.  She does note she has had some nausea throughout the course of her pregnancy.  She is not currently nauseous and declines any antiemetics on arrival.  On arrival she is slight tachycardic at 103 with otherwise stable vital signs on arrival.  She does have some tenderness on exam of the right lower quadrant of her abdomen.  She has no significant CVA tenderness.  Differential includes appendicitis, pyelonephritis, kidney stone, diverticulitis, cystitis, subchorionic hemorrhage, ovarian cyst or torsion with lower suspicion at this time for PID.  CBC shows slight leukocytosis with WBC count of 11.3 compared to 13.513 days ago.  No acute anemia and normal platelets.  CMP remarkable for bicarb of 19 with normal anion gap and otherwise no significant electrolyte or metabolic derangements.  Kidney function is within normal limits.  No evidence of hepatitis or cholestasis.  hCG is 80,000 appropriate for reported gestational age.  UA consistent with possible cystitis although somewhat equivocal as it has some rare bacteria and trace leukocyte esterase.  We will obtain urine culture and give a dose of Rocephin.  Patient has slight leukocytosis and is slightly tachycardic on arrival at this time we do not believe she is bacteremic or septic as her white blood cell count is decreased over the last 2 weeks she otherwise is quite well-appearing without any evidence of  hypotension or fever.   Renal ultrasound shows no evidence of abscess or stranding or hydronephrosis.  No stones visualized.  Pelvic OB ultrasound shows IUP approximately 12 weeks 3 days without evidence of hemorrhage or other acute abnormality.  Adnexa are unremarkable.  MR abdomen pelvis shows some mild stranding in the ascending colon without any evidence of diverticulitis, appendicitis, kidney stone, perinephric stranding, abscess, abnormal adnexa, or other acute abdominal or pelvic process.  On reassessment patient states she is feeling better after Tylenol.  She has now tolerated p.o.  Impression is possible cystitis and a component of gastritis and colitis of the patient has not had any significant diarrhea.  Given stable vitals with eyes reassuring exam work-up and patient tolerating p.o. and feeling better I think she is stable for discharge with close outpatient OB follow-up.  Will write Rx for Diclegis and Keflex.  Discharged stable condition.  Strict return precautions advised and discussed.     ____________________________________________   FINAL CLINICAL IMPRESSION(S) / ED DIAGNOSES  Final diagnoses:  Abdominal pain  Lower abdominal pain  Cystitis    Medications  pyridOXINE (B-6) injection 100 mg (has no administration in time range)  doxylamine (Sleep) (UNISOM) tablet 25 mg (has no administration in time range)  lactated ringers bolus 1,000 mL (0 mLs Intravenous Stopped 02/25/21 1411)  cefTRIAXone (ROCEPHIN) 1 g in sodium chloride 0.9 % 100 mL IVPB (0 g Intravenous Stopped 02/25/21 1520)  acetaminophen (TYLENOL) tablet 1,000 mg (1,000 mg Oral Given 02/25/21 1604)     ED Discharge Orders          Ordered    Doxylamine-Pyridoxine (DICLEGIS) 10-10 MG TBEC  2 times daily PRN        02/25/21 1640    cephALEXin (KEFLEX) 500 MG capsule  4 times daily        02/25/21 1640             Note:  This document was prepared using Dragon voice recognition software and may  include unintentional dictation errors.    Gilles Chiquito, MD 02/25/21 [id number]

## 2021-02-26 LAB — URINE CULTURE: Culture: <10000 — AB

## 2021-03-02 ENCOUNTER — Other Ambulatory Visit: Payer: Self-pay | Admitting: Obstetrics & Gynecology

## 2021-03-02 MED ORDER — NITROFURANTOIN MONOHYD MACRO 100 MG PO CAPS: 100.0000 mg | ORAL_CAPSULE | Freq: Two times a day (BID) | ORAL | 0 refills | Status: DC

## 2021-03-12 ENCOUNTER — Encounter: Payer: BC Managed Care – PPO | Admitting: Obstetrics

## 2021-03-16 ENCOUNTER — Telehealth: Payer: Self-pay

## 2021-03-16 NOTE — Telephone Encounter (Signed)
Pt calling; had spotting and cramping last night; this morning had dark blood and passed a grape sized clot.  [phone] pt states it's just when she wipes; no IC in the last 24-48 hours.  Adv dark means it's old and the clot is from where the blood sat in there and gelled.  Adv to monitor and if becomes like a period to be seen.

## 2021-03-17 ENCOUNTER — Ambulatory Visit (INDEPENDENT_AMBULATORY_CARE_PROVIDER_SITE_OTHER): Payer: BC Managed Care – PPO | Admitting: Obstetrics and Gynecology

## 2021-03-17 ENCOUNTER — Other Ambulatory Visit: Payer: Self-pay

## 2021-03-17 ENCOUNTER — Encounter: Payer: Self-pay | Admitting: Obstetrics and Gynecology

## 2021-03-17 VITALS — BP 122/70 | Wt 166.0 lb

## 2021-03-17 DIAGNOSIS — Z3A15 15 weeks gestation of pregnancy: Secondary | ICD-10-CM

## 2021-03-17 DIAGNOSIS — O99212 Obesity complicating pregnancy, second trimester: Secondary | ICD-10-CM

## 2021-03-17 DIAGNOSIS — Z3492 Encounter for supervision of normal pregnancy, unspecified, second trimester: Secondary | ICD-10-CM

## 2021-03-17 NOTE — Progress Notes (Signed)
Routine Prenatal Care Visit  Subjective  Dominique Vega is a 30 y.o. G4P0030 at [redacted]w[redacted]d being seen today for ongoing prenatal care.  She is currently monitored for the following issues for this low-risk pregnancy and has Recurrent UTI (urinary tract infection); Pyelonephritis; Fever; Abdominal bloating; Diarrhea; Supervision of low-risk pregnancy, first trimester; and Pregnancy complicated by obesity, first trimester on their problem list.  ----------------------------------------------------------------------------------- Patient reports a small brown clot a couple of days ago, with scant brown blood yesterday. Perhaps a pink tinge to her discharge today. No cramping.   Contractions: Not present. Vag. Bleeding: Scant.  Movement: Present. Leaking Fluid denies.  ----------------------------------------------------------------------------------- The following portions of the patient's history were reviewed and updated as appropriate: allergies, current medications, past family history, past medical history, past social history, past surgical history and problem list. Problem list updated.  Objective  Blood pressure 122/70, weight 166 lb (75.3 kg), last menstrual period 11/29/2020. Pregravid weight 167 lb (75.8 kg) Total Weight Gain -1 lb (-0.454 kg) Urinalysis: Urine Protein    Urine Glucose    Fetal Status: Fetal Heart Rate (bpm): 148   Movement: Present     General:  Alert, oriented and cooperative. Patient is in no acute distress.  Skin: Skin is warm and dry. No rash noted.   Cardiovascular: Normal heart rate noted  Respiratory: Normal respiratory effort, no problems with respiration noted  Abdomen: Soft, gravid, appropriate for gestational age. Pain/Pressure: Absent     Pelvic:  Cervical exam performed       Cervix visually closed, no active bleeding, no blood in vaginal vault.    Extremities: Normal range of motion.     Mental Status: Normal mood and affect. Normal behavior. Normal judgment  and thought content.   Female chaperone present for pelvic exam:   BSUS: viable fetus with +movement, +CA, placenta is anterior. ?some portion of placenta covering internal os.  No SCH.  Assessment   30 y.o. G4P0030 at [redacted]w[redacted]d by  09/05/2021, by Last Menstrual Period presenting for routine prenatal visit  Plan   [id number]  Problems (from 01/29/21 to present)     Problem Noted Resolved   Supervision of low-risk pregnancy, first trimester 01/29/2021 by Nadara Mustard, MD No   Overview Addendum 02/12/2021  3:24 PM by Mirna Mires, CNM     Nursing Staff Provider  Office Location  [facility] Dating  LMP and [place]  Language  English Anatomy US    Flu Vaccine   Genetic Screen  NIPS:   TDaP vaccine    Hgb A1C or  GTT Early : Third trimester :   Covid UTD   LAB RESULTS   Rhogam  N/A Blood Type A/Positive/-- (08/29 1630)   Feeding Plan  Antibody    Contraception  Rubella    Circumcision  RPR     Pediatrician   HBsAg     Support Person  HIV    Prenatal Classes  Varicella     GBS  (For PCN allergy, check sensitivities)   BTL Consent No    VBAC Consent N/A Pap  03/2020  BMI 32      Pregnancy complicated by obesity, first trimester 01/29/2021 by Nadara Mustard, MD No        Preterm labor symptoms and general obstetric precautions including but not limited to vaginal bleeding, contractions, leaking of fluid and fetal movement were reviewed in detail with the patient. Please refer to After Visit Summary for other counseling recommendations.   - pt reassured  about blood.  Discussed possible need for reassessment  if blood occurs again.  - anatomy u/s ordered today  Return in about 4 weeks (around 04/14/2021) for ROB.   Thomasene Mohair, MD, [facility], [facility] 03/17/2021 3:47 PM

## 2021-03-20 ENCOUNTER — Other Ambulatory Visit: Payer: Self-pay | Admitting: Obstetrics & Gynecology

## 2021-03-20 ENCOUNTER — Encounter: Payer: Self-pay | Admitting: Obstetrics

## 2021-03-20 DIAGNOSIS — Z3169 Encounter for other general counseling and advice on procreation: Secondary | ICD-10-CM

## 2021-03-25 DIAGNOSIS — Z20822 Contact with and (suspected) exposure to covid-19: Secondary | ICD-10-CM | POA: Diagnosis not present

## 2021-03-25 DIAGNOSIS — J111 Influenza due to unidentified influenza virus with other respiratory manifestations: Secondary | ICD-10-CM | POA: Diagnosis not present

## 2021-03-25 DIAGNOSIS — M791 Myalgia, unspecified site: Secondary | ICD-10-CM | POA: Diagnosis not present

## 2021-03-29 DIAGNOSIS — J029 Acute pharyngitis, unspecified: Secondary | ICD-10-CM | POA: Diagnosis not present

## 2021-03-30 ENCOUNTER — Encounter: Payer: Self-pay | Admitting: Obstetrics & Gynecology

## 2021-04-01 DIAGNOSIS — Z6831 Body mass index (BMI) 31.0-31.9, adult: Secondary | ICD-10-CM | POA: Diagnosis not present

## 2021-04-01 DIAGNOSIS — R6889 Other general symptoms and signs: Secondary | ICD-10-CM | POA: Diagnosis not present

## 2021-04-01 DIAGNOSIS — J069 Acute upper respiratory infection, unspecified: Secondary | ICD-10-CM | POA: Diagnosis not present

## 2021-04-01 DIAGNOSIS — J029 Acute pharyngitis, unspecified: Secondary | ICD-10-CM | POA: Diagnosis not present

## 2021-04-06 ENCOUNTER — Encounter: Payer: Self-pay | Admitting: Obstetrics & Gynecology

## 2021-04-06 ENCOUNTER — Encounter (HOSPITAL_COMMUNITY): Payer: Self-pay | Admitting: Family Medicine

## 2021-04-06 ENCOUNTER — Inpatient Hospital Stay (HOSPITAL_BASED_OUTPATIENT_CLINIC_OR_DEPARTMENT_OTHER): Payer: BC Managed Care – PPO

## 2021-04-06 ENCOUNTER — Inpatient Hospital Stay (HOSPITAL_COMMUNITY)
Admission: AD | Admit: 2021-04-06 | Discharge: 2021-04-06 | Disposition: A | Discharge status: Home / Self Care | Payer: BC Managed Care – PPO | Attending: Family Medicine | Admitting: Family Medicine

## 2021-04-06 DIAGNOSIS — R109 Unspecified abdominal pain: Secondary | ICD-10-CM | POA: Insufficient documentation

## 2021-04-06 DIAGNOSIS — O42912 Preterm premature rupture of membranes, unspecified as to length of time between rupture and onset of labor, second trimester: Secondary | ICD-10-CM | POA: Diagnosis not present

## 2021-04-06 DIAGNOSIS — Z3A18 18 weeks gestation of pregnancy: Secondary | ICD-10-CM

## 2021-04-06 DIAGNOSIS — O42919 Preterm premature rupture of membranes, unspecified as to length of time between rupture and onset of labor, unspecified trimester: Secondary | ICD-10-CM

## 2021-04-06 DIAGNOSIS — O99212 Obesity complicating pregnancy, second trimester: Secondary | ICD-10-CM | POA: Insufficient documentation

## 2021-04-06 DIAGNOSIS — O26892 Other specified pregnancy related conditions, second trimester: Secondary | ICD-10-CM

## 2021-04-06 DIAGNOSIS — O09292 Supervision of pregnancy with other poor reproductive or obstetric history, second trimester: Secondary | ICD-10-CM | POA: Diagnosis not present

## 2021-04-06 DIAGNOSIS — O26899 Other specified pregnancy related conditions, unspecified trimester: Secondary | ICD-10-CM

## 2021-04-06 DIAGNOSIS — O42112 Preterm premature rupture of membranes, onset of labor more than 24 hours following rupture, second trimester: Secondary | ICD-10-CM | POA: Diagnosis not present

## 2021-04-06 DIAGNOSIS — Z3491 Encounter for supervision of normal pregnancy, unspecified, first trimester: Secondary | ICD-10-CM

## 2021-04-06 DIAGNOSIS — O99210 Obesity complicating pregnancy, unspecified trimester: Secondary | ICD-10-CM | POA: Diagnosis not present

## 2021-04-06 DIAGNOSIS — O09621 Supervision of young multigravida, first trimester: Secondary | ICD-10-CM | POA: Diagnosis not present

## 2021-04-06 DIAGNOSIS — N898 Other specified noninflammatory disorders of vagina: Secondary | ICD-10-CM

## 2021-04-06 DIAGNOSIS — O99211 Obesity complicating pregnancy, first trimester: Secondary | ICD-10-CM

## 2021-04-06 DIAGNOSIS — O23599 Infection of other part of genital tract in pregnancy, unspecified trimester: Secondary | ICD-10-CM | POA: Diagnosis not present

## 2021-04-06 DIAGNOSIS — O42913 Preterm premature rupture of membranes, unspecified as to length of time between rupture and onset of labor, third trimester: Secondary | ICD-10-CM | POA: Diagnosis not present

## 2021-04-06 HISTORY — DX: Headache, unspecified: R51.9

## 2021-04-06 HISTORY — DX: Urinary tract infection, site not specified: N39.0

## 2021-04-06 HISTORY — DX: Unspecified ovarian cyst, unspecified side: N83.209

## 2021-04-06 LAB — WET PREP, GENITAL
Clue Cells Wet Prep HPF POC: NONE SEEN
Sperm: NONE SEEN
Trich, Wet Prep: NONE SEEN
WBC, Wet Prep HPF POC: >=10 — AB (ref <10)
Yeast Wet Prep HPF POC: NONE SEEN

## 2021-04-06 LAB — OB RESULTS CONSOLE GC/CHLAMYDIA: Gonorrhea: NEGATIVE

## 2021-04-06 LAB — AMNISURE RUPTURE OF MEMBRANE (ROM) NOT AT ARMC: Amnisure ROM: POSITIVE

## 2021-04-06 NOTE — MAU Note (Signed)
Last night bent down to pick up something and had a gush of fluid,soaked underwear and ran down legs. Had another gush when she bent down after changing underwear. This morning when she woke up, her underwear was wet.  Has had no leaking since. No bleeding.  Had a little bit of cramping. Has had 3 prior miscarriages, so has not been having sex.  Resumed relations on Sat, thought the cramping was maybe from that.

## 2021-04-06 NOTE — ED Triage Notes (Signed)
Pt. Stated, I had a gush of fluid during the night with some cramping, Ive had this 2 other times and told to go to the ER.

## 2021-04-06 NOTE — MAU Note (Signed)
No add'l leaking since arrived.no bleeding or change in pain.

## 2021-04-06 NOTE — MAU Provider Note (Signed)
History     CSN: [id number]  Arrival date and time: 04/06/21 0935   Event Date/Time   First Provider Initiated Contact with Patient 04/06/21 1144         Chief Complaint  Patient presents with   Abdominal Cramping   Rupture of Membranes   HPI This is a G4P3003 at [redacted]w[redacted]d with history of recurrent miscarriage. She is a patient of [facility]. She presents with leaking fluid that started yesterday evening with 2 larges gushes of fluid. She woke up this morning with wet underwear. She has felt some small movement. No bleeding, abdominal pain. She did have sex Saturday morning. She called her doctor's office this morning and was told to come to ED for evaluation.  OB History     Gravida  4   Para      Term      Preterm      AB  3   Living         SAB  3   IAB      Ectopic      Multiple      Live Births              Past Medical History:  Diagnosis Date   Anxiety    History of miscarriage    Irritable bowel syndrome     Past Surgical History:  Procedure Laterality Date   COLONOSCOPY WITH PROPOFOL N/A 08/13/2020   Procedure: COLONOSCOPY WITH PROPOFOL;  Surgeon: [name], MD;  Location: [facility] ENDOSCOPY;  Service: Gastroenterology;  Laterality: N/A;   ESOPHAGOGASTRODUODENOSCOPY (EGD) WITH PROPOFOL N/A 08/13/2020   Procedure: ESOPHAGOGASTRODUODENOSCOPY (EGD) WITH PROPOFOL;  Surgeon: [name], MD;  Location: [facility] ENDOSCOPY;  Service: Gastroenterology;  Laterality: N/A;    Family History  Problem Relation Age of Onset   Hypertension Mother    Diabetes Father     Social History   Tobacco Use   Smoking status: Never   Smokeless tobacco: Never  Vaping Use   Vaping Use: Never used  Substance Use Topics   Alcohol use: Not Currently   Drug use: Never    Allergies:  Allergies  Allergen Reactions   Penicillins Diarrhea and Nausea And Vomiting    Medications Prior to Admission  Medication Sig Dispense Refill Last Dose    albuterol (VENTOLIN HFA) 108 (90 Base) MCG/ACT inhaler Inhale 1-2 puffs into the lungs every 6 (six) hours as needed (cough). 6.7 g 0    dicyclomine (BENTYL) 20 MG tablet Take 1 tablet (20 mg total) by mouth 3 (three) times daily before meals. 30 tablet 0    escitalopram (LEXAPRO) 20 MG tablet Take 20 mg by mouth daily.      nitrofurantoin, macrocrystal-monohydrate, (MACROBID) 100 MG capsule Take 1 capsule (100 mg total) by mouth 2 (two) times daily. 10 capsule 0    omeprazole (PRILOSEC) 40 MG capsule Take 1 capsule (40 mg total) by mouth daily before breakfast. 30 capsule 2    ondansetron (ZOFRAN ODT) 4 MG disintegrating tablet Take 1 tablet (4 mg total) by mouth every 8 (eight) hours as needed for nausea or vomiting. 20 tablet 0    Prenatal Vit-Fe Fumarate-FA (PNV PRENATAL PLUS MULTIVITAMIN) 27-1 MG TABS Take 1 tablet by mouth daily.      promethazine (PHENERGAN) 12.5 MG tablet Take 1 tablet (12.5 mg total) by mouth every 8 (eight) hours as needed for nausea or vomiting. 30 tablet 0     Review of Systems Physical Exam  Blood pressure 124/75, pulse 87, temperature 98.3 F (36.8 C), temperature source Oral, resp. rate 18, height 5' (1.524 m), weight 74.3 kg, last menstrual period 11/29/2020, SpO2 100 %.  Physical Exam Vitals reviewed. Exam conducted with a chaperone present.  Constitutional:      Appearance: Normal appearance.  Cardiovascular:     Rate and Rhythm: Normal rate and regular rhythm.  Pulmonary:     Effort: Pulmonary effort is normal.     Breath sounds: Normal breath sounds.  Abdominal:     Hernia: There is no hernia in the left inguinal area or right inguinal area.  Genitourinary:    Labia:        Right: No tenderness or lesion.        Left: No tenderness or lesion.      Vagina: Normal. No vaginal discharge or tenderness.     Cervix: Normal.     Comments: Cervix closed. No fluid in vaginal vault. No fluid with valsalva. Lymphadenopathy:     Lower Body: No right  inguinal adenopathy. No left inguinal adenopathy.  Skin:    Capillary Refill: Capillary refill takes less than 2 seconds.  Neurological:     General: No focal deficit present.     Mental Status: She is alert.  Psychiatric:        Mood and Affect: Mood normal.        Behavior: Behavior normal.        Thought Content: Thought content normal.        Judgment: Judgment normal.   Results for orders placed or performed during the hospital encounter of 04/06/21 (from the past 24 hour(s))  Wet prep, genital     Status: Abnormal   Collection Time: 04/06/21 12:37 PM   Specimen: Vaginal; Genital  Result Value Ref Range   Yeast Wet Prep HPF POC NONE SEEN NONE SEEN   Trich, Wet Prep NONE SEEN NONE SEEN   Clue Cells Wet Prep HPF POC NONE SEEN NONE SEEN   WBC, Wet Prep HPF POC >=10 (A) <10   Sperm NONE SEEN   Amnisure rupture of membrane (rom)not at [facility]     Status: None   Collection Time: 04/06/21 12:37 PM  Result Value Ref Range   Amnisure ROM POSITIVE      MAU Course  Procedures Imaging: I independent reviewed the images of the [place]. IUP with baby in vertex position. FHR 144. Fair amount of fluid around the baby - 4cm pocket. CL 3.73cm.   MDM Talked with Dr Grace Bushy of MFM - at this point, no reason for admission. Will need close follow up with her primary OB and will need referral to MFM for continued observation of the amniotic fluid. There is good amount of fluid around the baby, which is reassuring, but also raises the question of whether this is a false positive test.  I also discussed pt with Dr Bonney Aid of [facility]. He will make arrangements for patient to have short interval follow up in the office.  Assessment and Plan   1. Supervision of low-risk pregnancy, first trimester   2. Pregnancy complicated by obesity, first trimester   3. Vaginal discharge in pregnancy   4. [redacted] weeks gestation of pregnancy   5. Preterm premature rupture of membranes (PPROM) with unknown onset of  labor    I discussed results with the patient - no evidence of preterm labor, infection, etc. Precautions given to the patient - return with fever, chills, nausea, cramping, bleeding.  Will need OP consult with MFM  Levie Heritage 04/06/2021, 11:44 AM

## 2021-04-06 NOTE — ED Notes (Signed)
Report called to MAU, and transport called

## 2021-04-06 NOTE — ED Provider Notes (Signed)
Emergency Medicine Provider Triage Evaluation Note  Dominique Vega , a 30 y.o. female  was evaluated in triage.  This is a 18-week pregnant female with a history of miscarriage.  She presents to the emergency department today with a large amount clear vaginal discharge that felt like a gush of fluid.  She is concerned that she had urinated on herself then realized it was not urine.  She denies any abdominal pain.  Review of Systems  Positive:  Negative:   Physical Exam  BP 117/69   Pulse (!) 105   Temp 98.3 F (36.8 C) (Oral)   Resp 16   LMP 11/29/2020   SpO2 97%  Gen:   Awake, no distress   Resp:  Normal effort  MSK:   Moves extremities without difficulty  Other:    Medical Decision Making  Medically screening exam initiated at 10:30 AM.  Appropriate orders placed.  Dominique Vega was informed that the remainder of the evaluation will be completed by another provider, this initial triage assessment does not replace that evaluation, and the importance of remaining in the ED until their evaluation is complete.  1034: spoke with MAU APP. Patient can be sent over to them.   Therese Sarah 04/06/21 1034    Benjiman Core, MD 04/07/21 1241

## 2021-04-07 ENCOUNTER — Encounter (HOSPITAL_COMMUNITY): Payer: Self-pay

## 2021-04-07 ENCOUNTER — Inpatient Hospital Stay (HOSPITAL_COMMUNITY)
Admission: AD | Admit: 2021-04-07 | Discharge: 2021-04-07 | Disposition: A | Discharge status: Home / Self Care | Payer: BC Managed Care – PPO | Attending: Family Medicine | Admitting: Family Medicine

## 2021-04-07 ENCOUNTER — Other Ambulatory Visit: Payer: Self-pay

## 2021-04-07 DIAGNOSIS — O42912 Preterm premature rupture of membranes, unspecified as to length of time between rupture and onset of labor, second trimester: Secondary | ICD-10-CM | POA: Diagnosis not present

## 2021-04-07 DIAGNOSIS — R102 Pelvic and perineal pain: Secondary | ICD-10-CM | POA: Insufficient documentation

## 2021-04-07 DIAGNOSIS — Z3A18 18 weeks gestation of pregnancy: Secondary | ICD-10-CM | POA: Diagnosis not present

## 2021-04-07 DIAGNOSIS — M549 Dorsalgia, unspecified: Secondary | ICD-10-CM | POA: Diagnosis not present

## 2021-04-07 DIAGNOSIS — O99211 Obesity complicating pregnancy, first trimester: Secondary | ICD-10-CM

## 2021-04-07 DIAGNOSIS — O26892 Other specified pregnancy related conditions, second trimester: Secondary | ICD-10-CM | POA: Diagnosis not present

## 2021-04-07 DIAGNOSIS — R300 Dysuria: Secondary | ICD-10-CM | POA: Insufficient documentation

## 2021-04-07 DIAGNOSIS — Z3491 Encounter for supervision of normal pregnancy, unspecified, first trimester: Secondary | ICD-10-CM

## 2021-04-07 DIAGNOSIS — R110 Nausea: Secondary | ICD-10-CM | POA: Insufficient documentation

## 2021-04-07 DIAGNOSIS — O42919 Preterm premature rupture of membranes, unspecified as to length of time between rupture and onset of labor, unspecified trimester: Secondary | ICD-10-CM

## 2021-04-07 LAB — CBC WITH DIFFERENTIAL/PLATELET
Abs Immature Granulocytes: 0.06 10*3/uL (ref 0.00–0.07)
Basophils Absolute: 0 10*3/uL (ref 0.0–0.1)
Basophils Relative: 0 %
Eosinophils Absolute: 0.5 10*3/uL (ref 0.0–0.5)
Eosinophils Relative: 3 %
HCT: 35.3 % — ABNORMAL LOW (ref 36.0–46.0)
Hemoglobin: 12.1 g/dL (ref 12.0–15.0)
Immature Granulocytes: 0 %
Lymphocytes Relative: 14 %
Lymphs Abs: 2 10*3/uL (ref 0.7–4.0)
MCH: 30.4 pg (ref 26.0–34.0)
MCHC: 34.3 g/dL (ref 30.0–36.0)
MCV: 88.7 fL (ref 80.0–100.0)
Monocytes Absolute: 0.6 10*3/uL (ref 0.1–1.0)
Monocytes Relative: 4 %
Neutro Abs: 11.2 10*3/uL — ABNORMAL HIGH (ref 1.7–7.7)
Neutrophils Relative %: 79 %
Platelets: 416 10*3/uL — ABNORMAL HIGH (ref 150–400)
RBC: 3.98 MIL/uL (ref 3.87–5.11)
RDW: 13.2 % (ref 11.5–15.5)
WBC: 14.3 10*3/uL — ABNORMAL HIGH (ref 4.0–10.5)
nRBC: 0 % (ref 0.0–0.2)

## 2021-04-07 LAB — URINALYSIS, ROUTINE W REFLEX MICROSCOPIC
Bilirubin Urine: NEGATIVE
Glucose, UA: NEGATIVE mg/dL
Hgb urine dipstick: NEGATIVE
Ketones, ur: NEGATIVE mg/dL
Leukocytes,Ua: NEGATIVE
Nitrite: NEGATIVE
Protein, ur: NEGATIVE mg/dL
Specific Gravity, Urine: 1.01 (ref 1.005–1.030)
pH: 6.5 (ref 5.0–8.0)

## 2021-04-07 LAB — GC/CHLAMYDIA PROBE AMP (~~LOC~~) NOT AT ARMC
Chlamydia: NEGATIVE
Comment: NEGATIVE
Comment: NORMAL
Neisseria Gonorrhea: NEGATIVE

## 2021-04-07 MED ORDER — FLUCONAZOLE 150 MG PO TABS
150.0000 mg | ORAL_TABLET | Freq: Once | ORAL | Status: AC
  Administered 2021-04-07: 150 mg via ORAL
  Filled 2021-04-07: qty 1

## 2021-04-07 NOTE — Telephone Encounter (Signed)
Cab any one see her?

## 2021-04-07 NOTE — MAU Provider Note (Signed)
History     CSN: [id number]  Arrival date and time: 04/07/21 1105   Event Date/Time   First Provider Initiated Contact with Patient 04/07/21 1324      Chief Complaint  Patient presents with   Nausea   Back Pain   Dysuria   HPI Dominique Vega is a 30 y.o. G4P0030 at [redacted]w[redacted]d who presents to MAU with chief complaint of dysuria, back pain and nausea. These are new problems, onset this morning. Patient's back pain is generalized to her low back. Pain waxes and wanes between 5/10 and 8/10. She has not taken medication or tried other treatments for this complaint. She denies aggravating or alleviating factors.  Patient also complains of dysuria. This is a new problem, onset coinciding with onset of her back pain. Pain is "crampy" and suprapubic. Pain does not radiate. She denies aggravating or alleviating factors.   Patient's pregnancy is complicated by Previable PPROM. She denies any leaking of fluid since she was diagnosed in MAU 24 hours ago. She denies fever.   Patient is scheduled for repeat ultrasound to track [facility] on 12/13 with provider visit with [facility] on 12/14.   OB History     Gravida  4   Para      Term      Preterm      AB  3   Living         SAB  3   IAB      Ectopic      Multiple      Live Births              Past Medical History:  Diagnosis Date   Anxiety    Headache    History of miscarriage    Irritable bowel syndrome    Ovarian cyst    UTI (urinary tract infection)     Past Surgical History:  Procedure Laterality Date   COLONOSCOPY WITH PROPOFOL N/A 08/13/2020   Procedure: COLONOSCOPY WITH PROPOFOL;  Surgeon: [name], MD;  Location: [facility] ENDOSCOPY;  Service: Gastroenterology;  Laterality: N/A;   ESOPHAGOGASTRODUODENOSCOPY (EGD) WITH PROPOFOL N/A 08/13/2020   Procedure: ESOPHAGOGASTRODUODENOSCOPY (EGD) WITH PROPOFOL;  Surgeon: [name], MD;  Location: [facility] ENDOSCOPY;  Service: Gastroenterology;  Laterality: N/A;     Family History  Problem Relation Age of Onset   Stroke Mother    Hypertension Mother    Stroke Father    Diabetes Father     Social History   Tobacco Use   Smoking status: Never   Smokeless tobacco: Never  Vaping Use   Vaping Use: Never used  Substance Use Topics   Alcohol use: Not Currently   Drug use: Never    Allergies:  Allergies  Allergen Reactions   Penicillins Diarrhea and Nausea And Vomiting    Medications Prior to Admission  Medication Sig Dispense Refill Last Dose   escitalopram (LEXAPRO) 20 MG tablet Take 20 mg by mouth daily.   04/06/2021   Prenatal Vit-Fe Fumarate-FA (PNV PRENATAL PLUS MULTIVITAMIN) 27-1 MG TABS Take 1 tablet by mouth daily.   04/06/2021   albuterol (VENTOLIN HFA) 108 (90 Base) MCG/ACT inhaler Inhale 1-2 puffs into the lungs every 6 (six) hours as needed (cough). 6.7 g 0    dicyclomine (BENTYL) 20 MG tablet Take 1 tablet (20 mg total) by mouth 3 (three) times daily before meals. 30 tablet 0    nitrofurantoin, macrocrystal-monohydrate, (MACROBID) 100 MG capsule Take 1 capsule (100 mg total) by mouth 2 (  two) times daily. 10 capsule 0    omeprazole (PRILOSEC) 40 MG capsule Take 1 capsule (40 mg total) by mouth daily before breakfast. 30 capsule 2    ondansetron (ZOFRAN ODT) 4 MG disintegrating tablet Take 1 tablet (4 mg total) by mouth every 8 (eight) hours as needed for nausea or vomiting. 20 tablet 0    promethazine (PHENERGAN) 12.5 MG tablet Take 1 tablet (12.5 mg total) by mouth every 8 (eight) hours as needed for nausea or vomiting. 30 tablet 0     Review of Systems  Constitutional:  Negative for fever.  Gastrointestinal:  Positive for abdominal pain.  Genitourinary:  Negative for vaginal bleeding, vaginal discharge and vaginal pain.  Musculoskeletal:  Positive for back pain.  All other systems reviewed and are negative. Physical Exam   Blood pressure 115/75, pulse 96, temperature 98.6 F (37 C), temperature source Oral, resp. rate  18, last menstrual period 11/29/2020, SpO2 99 %.  Physical Exam Vitals and nursing note reviewed. Exam conducted with a chaperone present.  Constitutional:      Appearance: Normal appearance. She is not ill-appearing.  Cardiovascular:     Rate and Rhythm: Normal rate and regular rhythm.     Pulses: Normal pulses.     Heart sounds: Normal heart sounds.  Pulmonary:     Effort: Pulmonary effort is normal.     Breath sounds: Normal breath sounds.  Genitourinary:    Comments: Pelvic exam: External genitalia normal, vaginal walls pink and well rugated, cervix visually closed, no lesions noted. Thick white viscous discharge c/w yeast   Skin:    Capillary Refill: Capillary refill takes less than 2 seconds.  Neurological:     Mental Status: She is alert and oriented to person, place, and time.  Psychiatric:        Mood and Affect: Mood normal.        Behavior: Behavior normal.        Thought Content: Thought content normal.        Judgment: Judgment normal.    MAU Course/MDM  Procedures  --WBCs 14.3 s/p one week abx treatment for sinus infection (at Urgent Care).  --Will treat presumptively for yeast based on physical exam, Diflucan confirmed with Dr. Shawnie Pons --Septic precautions reviewed extensively with patient and partner  --Discussed abdominal and back pain in setting of Previable PPROM, Clean urine. Elevated WBCs in setting or recent sinus infection. Low threshold for return to MAU or [facility] for preterm labor or new onset fever.  Orders Placed This Encounter  Procedures   Culture, OB Urine   Urinalysis, Routine w reflex microscopic Urine, Clean Catch   CBC with Differential/Platelet   Patient Vitals for the past 24 hrs:  BP Temp Temp src Pulse Resp SpO2  04/07/21 1352 130/82 -- -- 91 14 --  04/07/21 1149 115/75 98.6 F (37 C) Oral 96 18 99 %   Results for orders placed or performed during the hospital encounter of 04/07/21 (from the past 24 hour(s))  CBC with  Differential/Platelet     Status: Abnormal   Collection Time: 04/07/21 12:18 PM  Result Value Ref Range   WBC 14.3 (H) 4.0 - 10.5 K/uL   RBC 3.98 3.87 - 5.11 MIL/uL   Hemoglobin 12.1 12.0 - 15.0 g/dL   HCT 72.0 (L) 94.7 - 09.6 %   MCV 88.7 80.0 - 100.0 fL   MCH 30.4 26.0 - 34.0 pg   MCHC 34.3 30.0 - 36.0 g/dL   RDW 28.3 66.2 -  15.5 %   Platelets 416 (H) 150 - 400 K/uL   nRBC 0.0 0.0 - 0.2 %   Neutrophils Relative % 79 %   Neutro Abs 11.2 (H) 1.7 - 7.7 K/uL   Lymphocytes Relative 14 %   Lymphs Abs 2.0 0.7 - 4.0 K/uL   Monocytes Relative 4 %   Monocytes Absolute 0.6 0.1 - 1.0 K/uL   Eosinophils Relative 3 %   Eosinophils Absolute 0.5 0.0 - 0.5 K/uL   Basophils Relative 0 %   Basophils Absolute 0.0 0.0 - 0.1 K/uL   Immature Granulocytes 0 %   Abs Immature Granulocytes 0.06 0.00 - 0.07 K/uL  Urinalysis, Routine w reflex microscopic Urine, Clean Catch     Status: None   Collection Time: 04/07/21 12:55 PM  Result Value Ref Range   Color, Urine YELLOW YELLOW   APPearance CLEAR CLEAR   Specific Gravity, Urine 1.010 1.005 - 1.030   pH 6.5 5.0 - 8.0   Glucose, UA NEGATIVE NEGATIVE mg/dL   Hgb urine dipstick NEGATIVE NEGATIVE   Bilirubin Urine NEGATIVE NEGATIVE   Ketones, ur NEGATIVE NEGATIVE mg/dL   Protein, ur NEGATIVE NEGATIVE mg/dL   Nitrite NEGATIVE NEGATIVE   Leukocytes,Ua NEGATIVE NEGATIVE   Meds ordered this encounter  Medications   fluconazole (DIFLUCAN) tablet 150 mg    Patient is postive for Previable PPROM, discharge c/w yeast infection. Per Dr. Shawnie Pons, ok to give Diflucan x 1    Assessment and Plan  --30 y.o. G4P0030 at [redacted]w[redacted]d  --Previable PPROM (+ Amnisure 04/06/2021) --FHT 154 by Doppler --Afebrile --Diflucan x 1 s/p discussion with Dr. Shawnie Pons --Urine culture in work --Discharge home in stable condition with strict return precautions  F/U: Pt has repeat ultrasound scheduled for 04/14/2021, Provider appt 04/15/2021 with Melody Haver  Calvert Cantor,  CNM 04/07/2021, 5:43 PM

## 2021-04-07 NOTE — Telephone Encounter (Signed)
Please advise no opening in the scheduled for today!

## 2021-04-07 NOTE — MAU Note (Signed)
Pain in her low back woke her at 0300.  Is a constant cramping, pain is worse when she pees. Started feeling nauseated in the last hour. Has not had any further leaking or fluid, denies any bleeding.

## 2021-04-09 ENCOUNTER — Encounter: Payer: Self-pay | Admitting: Obstetrics and Gynecology

## 2021-04-09 ENCOUNTER — Encounter: Payer: Self-pay | Admitting: Obstetrics & Gynecology

## 2021-04-09 LAB — CULTURE, OB URINE: Culture: 10000 — AB

## 2021-04-09 MED ORDER — NITROFURANTOIN MONOHYD MACRO 100 MG PO CAPS: 100.0000 mg | ORAL_CAPSULE | Freq: Two times a day (BID) | ORAL | 0 refills | Status: AC

## 2021-04-09 NOTE — Telephone Encounter (Signed)
Can you send in a rx for her with these results from the hospital?

## 2021-04-14 ENCOUNTER — Ambulatory Visit
Admission: RE | Admit: 2021-04-14 | Discharge: 2021-04-14 | Disposition: A | Discharge status: Home / Self Care | Payer: BC Managed Care – PPO | Source: Ambulatory Visit | Attending: Obstetrics and Gynecology | Admitting: Obstetrics and Gynecology

## 2021-04-14 ENCOUNTER — Other Ambulatory Visit: Payer: Self-pay

## 2021-04-14 ENCOUNTER — Encounter: Payer: BC Managed Care – PPO | Admitting: Licensed Practical Nurse

## 2021-04-14 DIAGNOSIS — Z3A19 19 weeks gestation of pregnancy: Secondary | ICD-10-CM | POA: Diagnosis not present

## 2021-04-14 DIAGNOSIS — Z3492 Encounter for supervision of normal pregnancy, unspecified, second trimester: Secondary | ICD-10-CM | POA: Insufficient documentation

## 2021-04-15 ENCOUNTER — Encounter: Payer: BC Managed Care – PPO | Admitting: Obstetrics and Gynecology

## 2021-04-16 ENCOUNTER — Encounter: Payer: Self-pay | Admitting: Obstetrics and Gynecology

## 2021-04-16 ENCOUNTER — Other Ambulatory Visit: Payer: Self-pay

## 2021-04-16 ENCOUNTER — Observation Stay
Admission: EM | Admit: 2021-04-16 | Discharge: 2021-04-16 | Disposition: A | Discharge status: Home / Self Care | Payer: BC Managed Care – PPO | Attending: Obstetrics & Gynecology | Admitting: Obstetrics & Gynecology

## 2021-04-16 ENCOUNTER — Encounter: Payer: Self-pay | Admitting: Obstetrics & Gynecology

## 2021-04-16 ENCOUNTER — Other Ambulatory Visit: Payer: BC Managed Care – PPO

## 2021-04-16 ENCOUNTER — Ambulatory Visit (INDEPENDENT_AMBULATORY_CARE_PROVIDER_SITE_OTHER): Payer: BC Managed Care – PPO | Admitting: Obstetrics and Gynecology

## 2021-04-16 VITALS — BP 100/68 | Ht 60.0 in | Wt 168.4 lb

## 2021-04-16 DIAGNOSIS — Z3A19 19 weeks gestation of pregnancy: Secondary | ICD-10-CM

## 2021-04-16 DIAGNOSIS — M549 Dorsalgia, unspecified: Secondary | ICD-10-CM | POA: Diagnosis not present

## 2021-04-16 DIAGNOSIS — O26892 Other specified pregnancy related conditions, second trimester: Principal | ICD-10-CM | POA: Insufficient documentation

## 2021-04-16 DIAGNOSIS — M545 Low back pain, unspecified: Secondary | ICD-10-CM

## 2021-04-16 DIAGNOSIS — Z3491 Encounter for supervision of normal pregnancy, unspecified, first trimester: Secondary | ICD-10-CM

## 2021-04-16 DIAGNOSIS — E669 Obesity, unspecified: Secondary | ICD-10-CM | POA: Diagnosis not present

## 2021-04-16 DIAGNOSIS — O99212 Obesity complicating pregnancy, second trimester: Secondary | ICD-10-CM | POA: Insufficient documentation

## 2021-04-16 DIAGNOSIS — Z79899 Other long term (current) drug therapy: Secondary | ICD-10-CM | POA: Diagnosis not present

## 2021-04-16 DIAGNOSIS — N771 Vaginitis, vulvitis and vulvovaginitis in diseases classified elsewhere: Secondary | ICD-10-CM | POA: Diagnosis not present

## 2021-04-16 DIAGNOSIS — Z3A32 32 weeks gestation of pregnancy: Secondary | ICD-10-CM | POA: Diagnosis not present

## 2021-04-16 DIAGNOSIS — O99211 Obesity complicating pregnancy, first trimester: Secondary | ICD-10-CM

## 2021-04-16 DIAGNOSIS — R109 Unspecified abdominal pain: Secondary | ICD-10-CM | POA: Diagnosis not present

## 2021-04-16 DIAGNOSIS — N898 Other specified noninflammatory disorders of vagina: Secondary | ICD-10-CM | POA: Diagnosis not present

## 2021-04-16 DIAGNOSIS — O42919 Preterm premature rupture of membranes, unspecified as to length of time between rupture and onset of labor, unspecified trimester: Secondary | ICD-10-CM

## 2021-04-16 DIAGNOSIS — R875 Abnormal microbiological findings in specimens from female genital organs: Secondary | ICD-10-CM

## 2021-04-16 DIAGNOSIS — Z87440 Personal history of urinary (tract) infections: Secondary | ICD-10-CM | POA: Diagnosis not present

## 2021-04-16 LAB — CBC
HCT: 33.5 % — ABNORMAL LOW (ref 36.0–46.0)
Hemoglobin: 11.5 g/dL — ABNORMAL LOW (ref 12.0–15.0)
MCH: 30.2 pg (ref 26.0–34.0)
MCHC: 34.3 g/dL (ref 30.0–36.0)
MCV: 87.9 fL (ref 80.0–100.0)
Platelets: 357 10*3/uL (ref 150–400)
RBC: 3.81 MIL/uL — ABNORMAL LOW (ref 3.87–5.11)
RDW: 13.5 % (ref 11.5–15.5)
WBC: 12 10*3/uL — ABNORMAL HIGH (ref 4.0–10.5)
nRBC: 0 % (ref 0.0–0.2)

## 2021-04-16 LAB — URINALYSIS, COMPLETE (UACMP) WITH MICROSCOPIC
Bilirubin Urine: NEGATIVE
Glucose, UA: NEGATIVE mg/dL
Hgb urine dipstick: NEGATIVE
Ketones, ur: NEGATIVE mg/dL
Leukocytes,Ua: NEGATIVE
Nitrite: NEGATIVE
Protein, ur: NEGATIVE mg/dL
Specific Gravity, Urine: 1.02 (ref 1.005–1.030)
pH: 7 (ref 5.0–8.0)

## 2021-04-16 LAB — COMPREHENSIVE METABOLIC PANEL
ALT: 12 U/L (ref 0–44)
AST: 12 U/L — ABNORMAL LOW (ref 15–41)
Albumin: 3.3 g/dL — ABNORMAL LOW (ref 3.5–5.0)
Alkaline Phosphatase: 56 U/L (ref 38–126)
Anion gap: 5 (ref 5–15)
BUN: 6 mg/dL (ref 6–20)
CO2: 24 mmol/L (ref 22–32)
Calcium: 8.8 mg/dL — ABNORMAL LOW (ref 8.9–10.3)
Chloride: 106 mmol/L (ref 98–111)
Creatinine, Ser: 0.44 mg/dL (ref 0.44–1.00)
GFR, Estimated: >60 mL/min (ref >60)
Glucose, Bld: 83 mg/dL (ref 70–99)
Potassium: 4 mmol/L (ref 3.5–5.1)
Sodium: 135 mmol/L (ref 135–145)
Total Bilirubin: 0.3 mg/dL (ref 0.3–1.2)
Total Protein: 6.6 g/dL (ref 6.5–8.1)

## 2021-04-16 LAB — RUPTURE OF MEMBRANE (ROM)PLUS: Rom Plus: NEGATIVE

## 2021-04-16 LAB — GROUP B STREP BY PCR: Group B strep by PCR: NEGATIVE

## 2021-04-16 NOTE — OB Triage Note (Signed)
Pt is a G4P0030 at [redacted]w[redacted]d that presents from office with c/o LOF since 12/4. She was seen in MAU in [place] on 12/5 and had a positive ROM+ and was sent home. Pt denies LOF Since the 5th. Pt currently c/o cramping since Tuesday night, and nausea since this morning around 0900 morning. Pt states positive FM. FHT obtained via doppler 155.

## 2021-04-16 NOTE — Progress Notes (Unsigned)
detailed

## 2021-04-16 NOTE — H&P (Signed)
OB History & Physical   History of Present Illness:  Chief Complaint: abdominal pain/cramping  HPI:  Dominique Vega is a 30 y.o. G57P0030 female at [redacted]w[redacted]d dated by LMP.  Her pregnancy has been complicated by history of recurrent UTI, abdominal pain, obesity, history of 3 previous miscarriages .    She denies contractions although she has had pelvic cramping and back pain since Tuesday of this week and nausea this morning.   She reports leakage of fluid on 12/4 and admits intercourse on 12/3. She was seen in [place] on 12/5 and had positive amnisure at that time. She denies further leaking of fluid and has not worn a pad. She reports a spot of pink when wiping this morning and otherwise no vaginal bleeding.   She reports fetal movement.    Total weight gain for pregnancy: 0.454 kg   Obstetrical Problem List: pregnancy4  Problems (from 01/29/21 to present)     Problem Noted Resolved   Supervision of low-risk pregnancy, first trimester 01/29/2021 by Gae Dry, MD No   Overview Addendum 04/16/2021 10:30 AM by Homero Fellers, MD     Nursing Staff Provider  Office Location  [facility] Dating  LMP and [place]  Language  English Anatomy US    Flu Vaccine   Genetic Screen  NIPS:   TDaP vaccine    Hgb A1C or  GTT Early : Third trimester :   Covid UTD   LAB RESULTS   Rhogam  N/A Blood Type A/Positive/-- (10/13 1508)   Feeding Plan  Antibody Negative (10/13 1508)  Contraception  Rubella <0.90 (10/13 1508)  Circumcision  RPR Non Reactive (10/13 1508)   Pediatrician   HBsAg Negative (10/13 1508)   Support Person  HIV Non Reactive (10/13 1508)  Prenatal Classes  Varicella     GBS  (For PCN allergy, check sensitivities)   BTL Consent No    VBAC Consent N/A Pap  03/2020  BMI 32      Pregnancy complicated by obesity, first trimester 01/29/2021 by Gae Dry, MD No        Maternal Medical History:   Past Medical History:  Diagnosis Date   Anxiety    Headache    History of  miscarriage    Irritable bowel syndrome    Ovarian cyst    UTI (urinary tract infection)     Past Surgical History:  Procedure Laterality Date   COLONOSCOPY WITH PROPOFOL N/A 08/13/2020   Procedure: COLONOSCOPY WITH PROPOFOL;  Surgeon: Lin Landsman, MD;  Location: [facility] ENDOSCOPY;  Service: Gastroenterology;  Laterality: N/A;   ESOPHAGOGASTRODUODENOSCOPY (EGD) WITH PROPOFOL N/A 08/13/2020   Procedure: ESOPHAGOGASTRODUODENOSCOPY (EGD) WITH PROPOFOL;  Surgeon: Lin Landsman, MD;  Location: [facility] ENDOSCOPY;  Service: Gastroenterology;  Laterality: N/A;    Allergies  Allergen Reactions   Penicillins Diarrhea and Nausea And Vomiting    Prior to Admission medications   Medication Sig Start Date End Date Taking? Authorizing Provider  escitalopram (LEXAPRO) 20 MG tablet Take 20 mg by mouth daily. 12/12/19  Yes [provider]  Prenatal Vit-Fe Fumarate-FA (PNV PRENATAL PLUS MULTIVITAMIN) 27-1 MG TABS Take 1 tablet by mouth daily.   Yes [provider]  albuterol (VENTOLIN HFA) 108 (90 Base) MCG/ACT inhaler Inhale 1-2 puffs into the lungs every 6 (six) hours as needed (cough). Patient not taking: Reported on 04/16/2021 01/26/21   Serafina Royals, FNP  dicyclomine (BENTYL) 20 MG tablet Take 1 tablet (20 mg total) by mouth 3 (three)  times daily before meals. Patient not taking: Reported on 04/16/2021 06/19/20   Pearson Forster, NP  nitrofurantoin, macrocrystal-monohydrate, (MACROBID) 100 MG capsule Take 1 capsule (100 mg total) by mouth 2 (two) times daily. Patient not taking: Reported on 04/16/2021 03/02/21   Gae Dry, MD  omeprazole (PRILOSEC) 40 MG capsule Take 1 capsule (40 mg total) by mouth daily before breakfast. 08/13/20 09/12/20  Lin Landsman, MD  ondansetron (ZOFRAN ODT) 4 MG disintegrating tablet Take 1 tablet (4 mg total) by mouth every 8 (eight) hours as needed for nausea or vomiting. Patient not taking: Reported on 04/16/2021 01/12/21   Gae Dry, MD  promethazine (PHENERGAN) 12.5 MG tablet Take 1 tablet (12.5 mg total) by mouth every 8 (eight) hours as needed for nausea or vomiting. Patient not taking: Reported on 04/16/2021 02/07/21   Rod Can, CNM    OB History  Gravida Para Term Preterm AB Living  4       3    SAB IAB Ectopic Multiple Live Births  3            # Outcome Date GA Lbr Len/2nd Weight Sex Delivery Anes PTL Lv  4 Current           3 SAB 02/04/20          2 SAB 11/2019          1 SAB 10/2017            Prenatal care site: [facility]  Social History: She  reports that she has never smoked. She has never used smokeless tobacco. She reports that she does not currently use alcohol. She reports that she does not use drugs.  Family History: family history includes Diabetes in her father; Hypertension in her mother; Stroke in her father and mother.    Review of Systems:  Review of Systems  Constitutional:  Negative for chills and fever.  HENT:  Negative for congestion, ear discharge, ear pain, hearing loss, sinus pain and sore throat.   Eyes:  Negative for blurred vision and double vision.  Respiratory:  Negative for cough, shortness of breath and wheezing.   Cardiovascular:  Negative for chest pain, palpitations and leg swelling.  Gastrointestinal:  Positive for abdominal pain. Negative for blood in stool, constipation, diarrhea, heartburn, melena, nausea and vomiting.  Genitourinary:  Positive for dysuria. Negative for flank pain, frequency, hematuria and urgency.       Positive for lower abdominal/pelvic cramping  Musculoskeletal:  Positive for back pain. Negative for joint pain and myalgias.  Skin:  Negative for itching and rash.  Neurological:  Negative for dizziness, tingling, tremors, sensory change, speech change, focal weakness, seizures, loss of consciousness, weakness and headaches.  Endo/Heme/Allergies:  Negative for environmental allergies. Does not bruise/bleed easily.   Psychiatric/Behavioral:  Negative for depression, hallucinations, memory loss, substance abuse and suicidal ideas. The patient is not nervous/anxious and does not have insomnia.     Physical Exam:  BP 105/64 (BP Location: Left Arm)    Pulse 79    Temp 98.4 F (36.9 C) (Oral)    Resp 16    Ht 5' (1.524 m)    Wt 76.2 kg    LMP 11/29/2020    BMI 32.81 kg/m   Constitutional: Well nourished, well developed female in no acute distress.  HEENT: normal Skin: Warm and dry.  Cardiovascular: Regular rate and rhythm.   Extremity:  no edema   Respiratory: Clear to auscultation bilateral. Normal  respiratory effort Abdomen: FHT present, 155 by doppler Back: no CVAT Neuro: DTRs 2+, Cranial nerves grossly intact Psych: Alert and Oriented x3. No memory deficits. Normal mood and affect.    Pelvic exam: deferred Negative for pooling in the office this morning per Dr Jerene Pitch   04/16/21 12:28  Rom Plus NEGATIVE     Lab Results  Component Value Date   SARSCOV2NAA Not Detected 06/02/2020    Assessment:  Dominique Vega is a 30 y.o. G74P0030 female at [redacted]w[redacted]d with abdominal pain.   Plan:  Admit to Labor & Delivery  CBC, CMP, UA, ROM plus GBS pending.   Fetal well-being: positive heart tones by doppler MFM appointment this afternoon at [facility], CNM 04/16/2021 1:14 PM

## 2021-04-16 NOTE — OB Triage Note (Signed)
Patient Discharged home per provider. Pt instructed to keep all follow up appointments with her provider. AVS given to patient and RN answered all questions and patient has no further questions at this time. Pt discharged home in stable condition with significant other.

## 2021-04-16 NOTE — Discharge Summary (Signed)
Physician Discharge Summary  Patient ID: Dominique Vega MRN: [id number] DOB/AGE: April 05, 1991 30 y.o.  Admit date: 04/16/2021 Discharge date: 04/16/2021  Admission Diagnoses: Back pain 19 weeks pregnancy Concern for PPROM  Discharge Diagnoses:  Principal Problem:   Abdominal pain during pregnancy, second trimester   Low back pain   Negative testing for PPROM  Discharged Condition: good  Hospital Course: Pt seen, examined, monitored, and labs obtained.  ROM Plus testing NEG.  CBS shows improved WBC (12).  No s/sx iinfection, labor.  D/w MFM Dr Judeth Cornfield.  [place] from 2 days ago normal.  Plan f/u w [place] next week.  Pt to monitor for s/sx infection.  Consults: None  Significant Diagnostic Studies: labs:  Results for orders placed or performed during the hospital encounter of 04/16/21  CBC  Result Value Ref Range   WBC 12.0 (H) 4.0 - 10.5 K/uL   RBC 3.81 (L) 3.87 - 5.11 MIL/uL   Hemoglobin 11.5 (L) 12.0 - 15.0 g/dL   HCT 76.2 (L) 83.1 - 51.7 %   MCV 87.9 80.0 - 100.0 fL   MCH 30.2 26.0 - 34.0 pg   MCHC 34.3 30.0 - 36.0 g/dL   RDW 61.6 07.3 - 71.0 %   Platelets 357 150 - 400 K/uL   nRBC 0.0 0.0 - 0.2 %  Comprehensive metabolic panel  Result Value Ref Range   Sodium 135 135 - 145 mmol/L   Potassium 4.0 3.5 - 5.1 mmol/L   Chloride 106 98 - 111 mmol/L   CO2 24 22 - 32 mmol/L   Glucose, Bld 83 70 - 99 mg/dL   BUN 6 6 - 20 mg/dL   Creatinine, Ser 6.26 0.44 - 1.00 mg/dL   Calcium 8.8 (L) 8.9 - 10.3 mg/dL   Total Protein 6.6 6.5 - 8.1 g/dL   Albumin 3.3 (L) 3.5 - 5.0 g/dL   AST 12 (L) 15 - 41 U/L   ALT 12 0 - 44 U/L   Alkaline Phosphatase 56 38 - 126 U/L   Total Bilirubin 0.3 0.3 - 1.2 mg/dL   GFR, Estimated >94 >85 mL/min   Anion gap 5 5 - 15  Urinalysis, Complete w Microscopic Urine, Clean Catch  Result Value Ref Range   Color, Urine YELLOW YELLOW   APPearance CLEAR (A) CLEAR   Specific Gravity, Urine 1.020 1.005 - 1.030   pH 7.0 5.0 - 8.0   Glucose, UA NEGATIVE NEGATIVE mg/dL    Hgb urine dipstick NEGATIVE NEGATIVE   Bilirubin Urine NEGATIVE NEGATIVE   Ketones, ur NEGATIVE NEGATIVE mg/dL   Protein, ur NEGATIVE NEGATIVE mg/dL   Nitrite NEGATIVE NEGATIVE   Leukocytes,Ua NEGATIVE NEGATIVE   RBC / HPF 0-5 0 - 5 RBC/hpf   WBC, UA 0-5 0 - 5 WBC/hpf   Bacteria, UA FEW (A) NONE SEEN   Squamous Epithelial / LPF 6-10 0 - 5   Mucus PRESENT   ROM Plus ([facility] only)  Result Value Ref Range   Rom Plus NEGATIVE     Treatments: none  Discharge Exam: Blood pressure 126/84, pulse (!) 110, temperature 98.4 F (36.9 C), temperature source Oral, resp. rate 16, height 5' (1.524 m), weight 76.2 kg, last menstrual period 11/29/2020. General appearance: alert, cooperative, and no distress GI: soft, non-tender; bowel sounds normal; no masses,  no organomegaly FHT 130s Rest of exam unchanged  Disposition: Discharge disposition: 01-Home or Self Care       Discharge Instructions     Call MD for:   Complete by:  As directed    Worsening contractions or pain; leakage of fluid; bleeding.   Diet - low sodium heart healthy   Complete by: As directed    Increase activity slowly   Complete by: As directed       Allergies as of 04/16/2021       Reactions   Penicillins Diarrhea, Nausea And Vomiting        Medication List     STOP taking these medications    nitrofurantoin (macrocrystal-monohydrate) 100 MG capsule Commonly known as: Macrobid   promethazine 12.5 MG tablet Commonly known as: PHENERGAN       TAKE these medications    albuterol 108 (90 Base) MCG/ACT inhaler Commonly known as: VENTOLIN HFA Inhale 1-2 puffs into the lungs every 6 (six) hours as needed (cough).   dicyclomine 20 MG tablet Commonly known as: BENTYL Take 1 tablet (20 mg total) by mouth 3 (three) times daily before meals.   escitalopram 20 MG tablet Commonly known as: LEXAPRO Take 20 mg by mouth daily.   omeprazole 40 MG capsule Commonly known as: PRILOSEC Take 1 capsule (40  mg total) by mouth daily before breakfast.   ondansetron 4 MG disintegrating tablet Commonly known as: Zofran ODT Take 1 tablet (4 mg total) by mouth every 8 (eight) hours as needed for nausea or vomiting.   PNV Prenatal Plus Multivitamin 27-1 MG Tabs Take 1 tablet by mouth daily.         Signed: Letitia Libra 04/16/2021, 1:43 PM

## 2021-04-19 ENCOUNTER — Encounter: Payer: Self-pay | Admitting: Obstetrics & Gynecology

## 2021-04-19 LAB — NUSWAB BV AND CANDIDA, NAA
Candida albicans, NAA: NEGATIVE
Candida glabrata, NAA: NEGATIVE

## 2021-04-20 ENCOUNTER — Encounter (HOSPITAL_COMMUNITY): Payer: Self-pay | Admitting: Obstetrics and Gynecology

## 2021-04-20 ENCOUNTER — Encounter: Payer: Self-pay | Admitting: Advanced Practice Midwife

## 2021-04-20 ENCOUNTER — Encounter: Payer: Self-pay | Admitting: Obstetrics & Gynecology

## 2021-04-20 ENCOUNTER — Other Ambulatory Visit: Payer: Self-pay

## 2021-04-20 ENCOUNTER — Inpatient Hospital Stay (HOSPITAL_BASED_OUTPATIENT_CLINIC_OR_DEPARTMENT_OTHER): Payer: BC Managed Care – PPO

## 2021-04-20 ENCOUNTER — Inpatient Hospital Stay (HOSPITAL_COMMUNITY)
Admission: AD | Admit: 2021-04-20 | Discharge: 2021-04-20 | Disposition: A | Discharge status: Home / Self Care | Payer: BC Managed Care – PPO | Attending: Family Medicine | Admitting: Family Medicine

## 2021-04-20 DIAGNOSIS — Z3A20 20 weeks gestation of pregnancy: Secondary | ICD-10-CM

## 2021-04-20 DIAGNOSIS — O4290 Premature rupture of membranes, unspecified as to length of time between rupture and onset of labor, unspecified weeks of gestation: Secondary | ICD-10-CM

## 2021-04-20 DIAGNOSIS — O42912 Preterm premature rupture of membranes, unspecified as to length of time between rupture and onset of labor, second trimester: Secondary | ICD-10-CM | POA: Diagnosis not present

## 2021-04-20 DIAGNOSIS — Z3492 Encounter for supervision of normal pregnancy, unspecified, second trimester: Secondary | ICD-10-CM | POA: Diagnosis not present

## 2021-04-20 LAB — WET PREP, GENITAL
Clue Cells Wet Prep HPF POC: NONE SEEN
Sperm: NONE SEEN
Trich, Wet Prep: NONE SEEN
WBC, Wet Prep HPF POC: >=10 — AB (ref <10)
Yeast Wet Prep HPF POC: NONE SEEN

## 2021-04-20 NOTE — MAU Note (Signed)
Dominique Vega is a 30 y.o. at [redacted]w[redacted]d here in MAU reporting: was here a couple weeks ago with LOF and amnisure was positive. Testing last week was negative. Pt states LOF had stopped but then today when she woke up her panties were wet and she is unsure if it is fluid. States it seems clear. No more leaking. No bleeding, no pain.   Onset of complaint: today  Pain score: 0/10  Vitals:   04/20/21 0843  BP: (!) 115/57  Pulse: 98  Resp: 16  Temp: 98.1 F (36.7 C)  SpO2: 97%     FHT:145  Lab orders placed from triage: none

## 2021-04-20 NOTE — MAU Provider Note (Signed)
History     CSN: [id number]  Arrival date and time: 04/20/21 0808   Event Date/Time   First Provider Initiated Contact with Patient 04/20/21 [phone]      Chief Complaint  Patient presents with   Rupture of Membranes   HPI This is a 30 year old G4, P0 at 20 weeks and 2 days.  Her pregnancy is complicated by being diagnosed with previable PPROM 2 weeks ago by me.  Her AmniSure was positive after presenting with spearing seeing a gush of fluid.  She was subsequently seen 4 days ago due to some pain.  She had a Rom Plus, which was negative.  She had 2 ultrasounds done, both of which showed adequate fluid around the baby.  Today, the patient experienced waking up with wet underwear.  She used the bathroom and laid back down.  She did not notice whether her sheets were wet.  No recent sex.  Denies spotting, bleeding, contractions.  No palliating or provoking factors.  Here, her pad was slightly damp, but nothing further.  OB History     Gravida  4   Para      Term      Preterm      AB  3   Living         SAB  3   IAB      Ectopic      Multiple      Live Births              Past Medical History:  Diagnosis Date   Anxiety    Headache    History of miscarriage    Irritable bowel syndrome    Ovarian cyst    UTI (urinary tract infection)     Past Surgical History:  Procedure Laterality Date   COLONOSCOPY WITH PROPOFOL N/A 08/13/2020   Procedure: COLONOSCOPY WITH PROPOFOL;  Surgeon: [name], MD;  Location: [facility] ENDOSCOPY;  Service: Gastroenterology;  Laterality: N/A;   ESOPHAGOGASTRODUODENOSCOPY (EGD) WITH PROPOFOL N/A 08/13/2020   Procedure: ESOPHAGOGASTRODUODENOSCOPY (EGD) WITH PROPOFOL;  Surgeon: [name], MD;  Location: [facility] ENDOSCOPY;  Service: Gastroenterology;  Laterality: N/A;    Family History  Problem Relation Age of Onset   Stroke Mother    Hypertension Mother    Stroke Father    Diabetes Father     Social History    Tobacco Use   Smoking status: Never   Smokeless tobacco: Never  Vaping Use   Vaping Use: Never used  Substance Use Topics   Alcohol use: Not Currently   Drug use: Never    Allergies:  Allergies  Allergen Reactions   Penicillins Diarrhea and Nausea And Vomiting    Medications Prior to Admission  Medication Sig Dispense Refill Last Dose   albuterol (VENTOLIN HFA) 108 (90 Base) MCG/ACT inhaler Inhale 1-2 puffs into the lungs every 6 (six) hours as needed (cough). (Patient not taking: Reported on 04/16/2021) 6.7 g 0    dicyclomine (BENTYL) 20 MG tablet Take 1 tablet (20 mg total) by mouth 3 (three) times daily before meals. (Patient not taking: Reported on 04/16/2021) 30 tablet 0    escitalopram (LEXAPRO) 20 MG tablet Take 20 mg by mouth daily.      omeprazole (PRILOSEC) 40 MG capsule Take 1 capsule (40 mg total) by mouth daily before breakfast. 30 capsule 2    ondansetron (ZOFRAN ODT) 4 MG disintegrating tablet Take 1 tablet (4 mg total) by mouth every 8 (eight) hours as needed  for nausea or vomiting. (Patient not taking: Reported on 04/16/2021) 20 tablet 0    Prenatal Vit-Fe Fumarate-FA (PNV PRENATAL PLUS MULTIVITAMIN) 27-1 MG TABS Take 1 tablet by mouth daily.       Review of Systems Physical Exam   Blood pressure (!) 115/57, pulse 98, temperature 98.1 F (36.7 C), temperature source Oral, resp. rate 16, height 5' (1.524 m), weight 73.8 kg, last menstrual period 11/29/2020, SpO2 97 %.  Physical Exam Vitals reviewed. Exam conducted with a chaperone present.  Constitutional:      Appearance: Normal appearance.  Abdominal:     General: Abdomen is flat. There is no distension.     Palpations: Abdomen is soft. There is no mass.     Tenderness: There is no abdominal tenderness. There is no guarding or rebound.     Hernia: No hernia is present. There is no hernia in the left inguinal area or right inguinal area.  Genitourinary:    Labia:        Right: No rash, tenderness or  lesion.        Left: No rash, tenderness or lesion.      Vagina: Normal. No signs of injury and foreign body. No vaginal discharge, erythema, tenderness or bleeding.     Cervix: No cervical motion tenderness.     Comments: No pooling.  Mucus discharge.  Cervix closed by exam. Lymphadenopathy:     Lower Body: No right inguinal adenopathy. No left inguinal adenopathy.  Skin:    Capillary Refill: Capillary refill takes less than 2 seconds.  Neurological:     General: No focal deficit present.     Mental Status: She is alert.  Psychiatric:        Mood and Affect: Mood normal.        Behavior: Behavior normal.        Thought Content: Thought content normal.        Judgment: Judgment normal.   Results for orders placed or performed during the hospital encounter of 04/20/21 (from the past 24 hour(s))  Wet prep, genital     Status: Abnormal   Collection Time: 04/20/21  9:20 AM  Result Value Ref Range   Yeast Wet Prep HPF POC NONE SEEN NONE SEEN   Trich, Wet Prep NONE SEEN NONE SEEN   Clue Cells Wet Prep HPF POC NONE SEEN NONE SEEN   WBC, Wet Prep HPF POC >=10 (A) <10   Sperm NONE SEEN      MAU Course  Procedures  Imaging: I independent reviewed the images of the [place]. FHR 141. CL 4.22cm. Placenta appears normal. Fluid appears normal with largest pocket of 4.5cm.  MDM I discussed patient with Dr. Grace Bushy of MFM.  Given negative pooling and negative fern, will obtain AFI.  As AFI is normal, patient had a recent negative ROM plus test, this is reassuring that the patient's water is not broken.  Follow-up with primary OB and return as needed.  Assessment and Plan   1. [redacted] weeks gestation of pregnancy   2. Amniotic fluid leaking   3. Intact amniotic membranes during pregnancy in second trimester    Discharge to home.  Follow-up with primary OB.  Return with any concerns of leaking fluid, bleeding, contractions.  Levie Heritage 04/20/2021, 9:25 AM

## 2021-04-21 ENCOUNTER — Ambulatory Visit (INDEPENDENT_AMBULATORY_CARE_PROVIDER_SITE_OTHER): Payer: BC Managed Care – PPO | Admitting: Obstetrics and Gynecology

## 2021-04-21 VITALS — BP 110/60 | Wt 166.0 lb

## 2021-04-21 DIAGNOSIS — Z3A20 20 weeks gestation of pregnancy: Secondary | ICD-10-CM

## 2021-04-21 DIAGNOSIS — Z3491 Encounter for supervision of normal pregnancy, unspecified, first trimester: Secondary | ICD-10-CM

## 2021-04-21 DIAGNOSIS — O42919 Preterm premature rupture of membranes, unspecified as to length of time between rupture and onset of labor, unspecified trimester: Secondary | ICD-10-CM

## 2021-04-21 LAB — POCT URINALYSIS DIPSTICK OB
Glucose, UA: NEGATIVE
POC,PROTEIN,UA: NEGATIVE

## 2021-04-21 MED ORDER — BUSPIRONE HCL 5 MG PO TABS: 7.5000 mg | ORAL_TABLET | Freq: Two times a day (BID) | ORAL | 1 refills | Status: DC

## 2021-04-21 NOTE — Progress Notes (Signed)
ROB - no concerns. RM 5 

## 2021-04-21 NOTE — Progress Notes (Signed)
Routine Prenatal Care Visit  Subjective  Dominique Vega is a 30 y.o. G4P0030 at [redacted]w[redacted]d being seen today for ongoing prenatal care.  She is currently monitored for the following issues for this high-risk pregnancy and has Recurrent UTI (urinary tract infection); Pyelonephritis; Fever; Abdominal bloating; Diarrhea; Supervision of low-risk pregnancy, first trimester; Pregnancy complicated by obesity, first trimester; Preterm premature rupture of membranes (PPROM) with unknown onset of labor; and Abdominal pain during pregnancy, second trimester on their problem list.  ----------------------------------------------------------------------------------- Patient reports no complaints.   Contractions: Not present. Vag. Bleeding: None.  Movement: Present. Denies leaking of fluid.  ----------------------------------------------------------------------------------- The following portions of the patient's history were reviewed and updated as appropriate: allergies, current medications, past family history, past medical history, past social history, past surgical history and problem list. Problem list updated.   Objective  Blood pressure 110/60, weight 166 lb (75.3 kg), last menstrual period 11/29/2020. Pregravid weight 167 lb (75.8 kg) Total Weight Gain -1 lb (-0.454 kg) Urinalysis:      Fetal Status: Fetal Heart Rate (bpm): 145   Movement: Present     General:  Alert, oriented and cooperative. Patient is in no acute distress.  Skin: Skin is warm and dry. No rash noted.   Cardiovascular: Normal heart rate noted  Respiratory: Normal respiratory effort, no problems with respiration noted  Abdomen: Soft, gravid, appropriate for gestational age. Pain/Pressure: Present     Pelvic:  Cervical exam deferred        Extremities: Normal range of motion.     ental Status: Normal mood and affect. Normal behavior. Normal judgment and thought content.   GAD 7 : Generalized Anxiety Score 04/21/2021   Nervous, Anxious, on Edge 2  Control/stop worrying 2  Worry too much - different things 2  Trouble relaxing 1  Restless 0  Easily annoyed or irritable 2  Afraid - awful might happen 2  Total GAD 7 Score 11  Anxiety Difficulty Somewhat difficult    Flowsheet Row Routine Prenatal [facility]21/[facility]  PHQ-9 Total Score 5        Assessment   30 y.o. G4P0030 at [redacted]w[redacted]d by  09/05/2021, by Last Menstrual Period presenting for routine prenatal visit  Plan   [email or web address]  Problems (from 01/29/21 to present)     Problem Noted Resolved   Supervision of low-risk pregnancy, first trimester 01/29/2021 by Nadara Mustard, MD No   Overview Addendum 04/16/2021 10:30 AM by Natale Milch, MD     Nursing Staff Provider  Office Location  [facility] Dating  LMP and [place]  Language  English Anatomy US    Flu Vaccine   Genetic Screen  NIPS: normal XX  TDaP vaccine    Hgb A1C or  GTT Early : Third trimester :   Covid UTD   LAB RESULTS   Rhogam  N/A Blood Type A/Positive/-- (10/13 1508)   Feeding Plan  Antibody Negative (10/13 1508)  Contraception  Rubella <0.90 (10/13 1508)  Circumcision  RPR Non Reactive (10/13 1508)   Pediatrician   HBsAg Negative (10/13 1508)   Support Person  HIV Non Reactive (10/13 1508)  Prenatal Classes  Varicella     GBS  (For PCN allergy, check sensitivities)   BTL Consent No    VBAC Consent N/A Pap  03/2020  BMI 32      Pregnancy complicated by obesity, first trimester 01/29/2021 by Nadara Mustard, MD No        Gestational  age appropriate obstetric precautions including but not limited to vaginal bleeding, contractions, leaking of fluid and fetal movement were reviewed in detail with the patient.    1) Positive amniosure - discussed that while amniosure does have a high sensitivity for picking up rupture of membranes, it has a high false positive rate 19-30% percent in patient later found to not have ruptured.  Clinically she has not  had any further leaking, MVP have been normal on follow up imaging. - continue to monitor weekly although I think the likelihood of rupture given the clinical picture remains low.  2) Anxiety - large situational component given questional previable PPROM.  Is on Lexapro 20mg , discussed addition of Buspar 5mg  po bid and titrate up to effect  Return in about 1 week (around 04/28/2021) for ROB MD.  Malachy Mood, MD, [place], [facility] 04/21/2021, 5:59 PM

## 2021-04-23 ENCOUNTER — Other Ambulatory Visit: Payer: Self-pay | Admitting: *Deleted

## 2021-04-23 ENCOUNTER — Other Ambulatory Visit: Payer: Self-pay

## 2021-04-23 ENCOUNTER — Ambulatory Visit: Payer: BC Managed Care – PPO | Admitting: *Deleted

## 2021-04-23 ENCOUNTER — Ambulatory Visit: Payer: BC Managed Care – PPO | Attending: Obstetrics and Gynecology

## 2021-04-23 ENCOUNTER — Ambulatory Visit: Payer: BC Managed Care – PPO | Admitting: Nurse Practitioner

## 2021-04-23 VITALS — BP 124/68 | HR 90

## 2021-04-23 DIAGNOSIS — O2622 Pregnancy care for patient with recurrent pregnancy loss, second trimester: Secondary | ICD-10-CM | POA: Insufficient documentation

## 2021-04-23 DIAGNOSIS — O99211 Obesity complicating pregnancy, first trimester: Secondary | ICD-10-CM | POA: Diagnosis not present

## 2021-04-23 DIAGNOSIS — Z3491 Encounter for supervision of normal pregnancy, unspecified, first trimester: Secondary | ICD-10-CM | POA: Diagnosis not present

## 2021-04-23 DIAGNOSIS — Z363 Encounter for antenatal screening for malformations: Secondary | ICD-10-CM | POA: Insufficient documentation

## 2021-04-23 DIAGNOSIS — O42912 Preterm premature rupture of membranes, unspecified as to length of time between rupture and onset of labor, second trimester: Secondary | ICD-10-CM

## 2021-04-23 DIAGNOSIS — Z3A20 20 weeks gestation of pregnancy: Secondary | ICD-10-CM | POA: Insufficient documentation

## 2021-04-23 DIAGNOSIS — E669 Obesity, unspecified: Secondary | ICD-10-CM | POA: Diagnosis not present

## 2021-04-23 DIAGNOSIS — Z3689 Encounter for other specified antenatal screening: Secondary | ICD-10-CM

## 2021-04-23 DIAGNOSIS — O42919 Preterm premature rupture of membranes, unspecified as to length of time between rupture and onset of labor, unspecified trimester: Secondary | ICD-10-CM | POA: Diagnosis not present

## 2021-04-23 DIAGNOSIS — O99212 Obesity complicating pregnancy, second trimester: Secondary | ICD-10-CM

## 2021-04-23 DIAGNOSIS — O09292 Supervision of pregnancy with other poor reproductive or obstetric history, second trimester: Secondary | ICD-10-CM

## 2021-04-28 DIAGNOSIS — R509 Fever, unspecified: Secondary | ICD-10-CM | POA: Diagnosis not present

## 2021-04-28 DIAGNOSIS — Z20822 Contact with and (suspected) exposure to covid-19: Secondary | ICD-10-CM | POA: Diagnosis not present

## 2021-05-01 ENCOUNTER — Ambulatory Visit (INDEPENDENT_AMBULATORY_CARE_PROVIDER_SITE_OTHER): Payer: BC Managed Care – PPO | Admitting: Obstetrics & Gynecology

## 2021-05-01 ENCOUNTER — Other Ambulatory Visit: Payer: Self-pay

## 2021-05-01 DIAGNOSIS — Z3482 Encounter for supervision of other normal pregnancy, second trimester: Secondary | ICD-10-CM

## 2021-05-01 DIAGNOSIS — Z3491 Encounter for supervision of normal pregnancy, unspecified, first trimester: Secondary | ICD-10-CM

## 2021-05-01 DIAGNOSIS — Z3A21 21 weeks gestation of pregnancy: Secondary | ICD-10-CM

## 2021-05-01 NOTE — Progress Notes (Signed)
Virtual Visit via Telephone Note  I connected with patient on 05/01/21 at  4:30 PM EST by telephone and verified that I am speaking with the correct person using two identifiers.   I discussed the limitations, risks, security and privacy concerns of performing an evaluation and management service by telephone and the availability of in person appointments. I also discussed with the patient that there may be a patient responsible charge related to this service. The patient expressed understanding and agreed to proceed.  The patient was at home (has flu) I spoke with the patient from my  office  Dominique Vega is a 30 y.o. G4P0030 at [redacted]w[redacted]d being seen today for ongoing prenatal care.  She is currently monitored for the following issues for this high-risk pregnancy and has Recurrent UTI (urinary tract infection); Pyelonephritis; Fever; Abdominal bloating; Diarrhea; Supervision of low-risk pregnancy, first trimester; Pregnancy complicated by obesity, first trimester; Preterm premature rupture of membranes (PPROM) with unknown onset of labor; and Abdominal pain during pregnancy, second trimester on their problem list.  ----------------------------------------------------------------------------------- Patient reports no complaints and no LOF . She has fatigue, aches from flu, no fever   Denies pain, VB, leaking of fluid.  ----------------------------------------------------------------------------------- The following portions of the patient's history were reviewed and updated as appropriate: allergies, current medications, past family history, past medical history, past social history, past surgical history and problem list. Problem list updated.   Objective  Last menstrual period 11/29/2020. Pregravid weight 167 lb (75.8 kg) Total Weight Gain -1 lb (-0.454 kg)  Physical Exam could not be performed. Because of the COVID-19 outbreak this visit was performed over the phone and not in person.    Assessment   30 y.o. G4P0030 at [redacted]w[redacted]d by  09/05/2021, by Last Menstrual Period presenting for routine prenatal visit  Plan   pregnancy4  Problems (from 01/29/21 to present)     Problem Noted Resolved   Supervision of low-risk pregnancy, first trimester 01/29/2021 by Nadara Mustard, MD No   Overview Addendum 05/01/2021  4:08 PM by Nadara Mustard, MD     Nursing Staff Provider  Office Location  [facility] Dating  LMP and [place]  Language  English Anatomy US  MFM [place] nml  Flu Vaccine  UTD Genetic Screen  NIPS:   TDaP vaccine    Hgb A1C or  GTT Early : Third trimester :   Covid UTD   LAB RESULTS   Rhogam  N/A Blood Type A/Positive/-- (10/13 1508)   Feeding Plan Breast Antibody Negative (10/13 1508)  Contraception NFP Rubella <0.90 (10/13 1508)  Circumcision n/a RPR Non Reactive (10/13 1508)   Pediatrician   HBsAg Negative (10/13 1508)   Support Person  HIV Non Reactive (10/13 1508)  Prenatal Classes  Varicella     GBS  (For PCN allergy, check sensitivities)   BTL Consent No    VBAC Consent N/A Pap  03/2020  BMI 32      Pregnancy complicated by obesity, first trimester 01/29/2021 by Nadara Mustard, MD No        Gestational age appropriate obstetric precautions including but not limited to vaginal bleeding, contractions, leaking of fluid and fetal movement were reviewed in detail with the patient.     Follow Up Instructions: Plans to take Tamiflu   I discussed the assessment and treatment plan with the patient. The patient was provided an opportunity to ask questions and all were answered. The patient agreed with the plan and demonstrated an understanding of the  instructions.   The patient was advised to call back or seek an in-person evaluation if the symptoms worsen or if the condition fails to improve as anticipated.  I provided 10 minutes of non-face-to-face time during this encounter.  No follow-ups on file.  Barnett Applebaum, MD [facility], [facility] 05/01/2021 4:08 PM

## 2021-05-04 ENCOUNTER — Encounter: Payer: Self-pay | Admitting: Obstetrics

## 2021-05-04 ENCOUNTER — Encounter: Payer: Self-pay | Admitting: Advanced Practice Midwife

## 2021-05-04 ENCOUNTER — Encounter: Payer: Self-pay | Admitting: Obstetrics & Gynecology

## 2021-05-04 ENCOUNTER — Encounter: Payer: Self-pay | Admitting: Obstetrics and Gynecology

## 2021-05-05 NOTE — Telephone Encounter (Signed)
Any opening ?

## 2021-05-05 NOTE — Telephone Encounter (Signed)
No opening in today's scheduled I can offer tomorrow. Patient is scheduled for 05/08/21

## 2021-05-05 NOTE — Telephone Encounter (Signed)
Patient replied she no longer needed the appointment. In appointment requests

## 2021-05-05 NOTE — Telephone Encounter (Signed)
Did you offer tomorrow, she will probably feel better then waiting til Friday.

## 2021-05-06 DIAGNOSIS — Z20822 Contact with and (suspected) exposure to covid-19: Secondary | ICD-10-CM | POA: Diagnosis not present

## 2021-05-06 DIAGNOSIS — Z6831 Body mass index (BMI) 31.0-31.9, adult: Secondary | ICD-10-CM | POA: Diagnosis not present

## 2021-05-06 DIAGNOSIS — J189 Pneumonia, unspecified organism: Secondary | ICD-10-CM | POA: Diagnosis not present

## 2021-05-06 DIAGNOSIS — R6889 Other general symptoms and signs: Secondary | ICD-10-CM | POA: Diagnosis not present

## 2021-05-08 ENCOUNTER — Encounter: Payer: BC Managed Care – PPO | Admitting: Obstetrics and Gynecology

## 2021-05-11 ENCOUNTER — Encounter: Payer: Self-pay | Admitting: Obstetrics & Gynecology

## 2021-05-12 DIAGNOSIS — R300 Dysuria: Secondary | ICD-10-CM | POA: Diagnosis not present

## 2021-05-12 DIAGNOSIS — N390 Urinary tract infection, site not specified: Secondary | ICD-10-CM | POA: Diagnosis not present

## 2021-05-13 ENCOUNTER — Encounter: Payer: Self-pay | Admitting: Obstetrics & Gynecology

## 2021-05-13 ENCOUNTER — Encounter: Payer: Self-pay | Admitting: Family Medicine

## 2021-05-13 ENCOUNTER — Other Ambulatory Visit: Payer: Self-pay | Admitting: Obstetrics & Gynecology

## 2021-05-13 ENCOUNTER — Other Ambulatory Visit: Payer: Self-pay

## 2021-05-13 ENCOUNTER — Ambulatory Visit (INDEPENDENT_AMBULATORY_CARE_PROVIDER_SITE_OTHER): Payer: BC Managed Care – PPO | Admitting: Family Medicine

## 2021-05-13 VITALS — BP 119/85 | HR 121 | Temp 98.5°F

## 2021-05-13 DIAGNOSIS — Z331 Pregnant state, incidental: Secondary | ICD-10-CM

## 2021-05-13 DIAGNOSIS — N390 Urinary tract infection, site not specified: Secondary | ICD-10-CM | POA: Diagnosis not present

## 2021-05-13 MED ORDER — ONDANSETRON 4 MG PO TBDP: 4.0000 mg | ORAL_TABLET | Freq: Four times a day (QID) | ORAL | 0 refills | Status: DC | PRN

## 2021-05-13 NOTE — Progress Notes (Signed)
° ° °  Subjective:    Patient ID: Dominique Vega is a 31 y.o. female presenting with Urinary Tract Infection  on 05/13/2021  HPI: Patient is a G4P0030 @ [redacted]w[redacted]d. Reports urinary frequency x 3 days. Notes that she had dysuria yesterday and went to UC. Given Macrobid and Pyridium and today has developed N/V. No fever. Took Zofran today which helped. Has some back pain in the low right side which radiates to the flank and groin.  Review of Systems  Constitutional:  Negative for chills and fever.  Respiratory:  Negative for shortness of breath.   Cardiovascular:  Negative for chest pain.  Gastrointestinal:  Negative for abdominal pain, nausea and vomiting.  Genitourinary:  Negative for dysuria.  Skin:  Negative for rash.     Objective:    BP 119/85    Pulse (!) 121    Temp 98.5 F (36.9 C)    LMP 11/29/2020  Physical Exam Constitutional:      General: She is not in acute distress.    Appearance: She is well-developed.  HENT:     Head: Normocephalic and atraumatic.  Eyes:     General: No scleral icterus. Cardiovascular:     Rate and Rhythm: Normal rate.  Pulmonary:     Effort: Pulmonary effort is normal.  Abdominal:     Palpations: Abdomen is soft.     Tenderness: There is no abdominal tenderness (no suprapubic tenderness).  Musculoskeletal:     Cervical back: Neck supple.     Comments: No CVA tenderness  Skin:    General: Skin is warm and dry.  Neurological:     Mental Status: She is alert and oriented to person, place, and time.        Assessment & Plan:   Problem List Items Addressed This Visit       Unprioritized   Recurrent UTI (urinary tract infection) - Primary    Last cx was 10,000 cfu of Staph epidermidis--likely a contaminant. Discussed warning signs and needed hospitalization with s/sx's of Pyelonephritis. Culture from UC should return tomorrow. Continue Macrobid and Pyridium.      Relevant Medications   nitrofurantoin, macrocrystal-monohydrate,  (MACROBID) 100 MG capsule   phenazopyridine (PYRIDIUM) 200 MG tablet   Other Visit Diagnoses     Incidental pregnancy            Return in about 1 week (around 05/20/2021) for has appointment scheduled.  Donnamae Jude 05/13/2021 3:07 PM

## 2021-05-13 NOTE — Assessment & Plan Note (Signed)
Last cx was 10,000 cfu of Staph epidermidis--likely a contaminant. Discussed warning signs and needed hospitalization with s/sx's of Pyelonephritis. Culture from UC should return tomorrow. Continue Macrobid and Pyridium.

## 2021-05-13 NOTE — Progress Notes (Signed)
Pt presents for problem visit today.  Recently seen at Urgent care for UTI. C/o nausea and back pain.  Rt side pelvic pain Pt notes tenderness with urination and burning.  Had spotting last week. None since last Monday.

## 2021-05-15 ENCOUNTER — Encounter: Payer: BC Managed Care – PPO | Admitting: Obstetrics and Gynecology

## 2021-05-18 ENCOUNTER — Ambulatory Visit (INDEPENDENT_AMBULATORY_CARE_PROVIDER_SITE_OTHER): Payer: BC Managed Care – PPO | Admitting: Family Medicine

## 2021-05-18 ENCOUNTER — Other Ambulatory Visit: Payer: Self-pay

## 2021-05-18 ENCOUNTER — Encounter: Payer: Self-pay | Admitting: Family Medicine

## 2021-05-18 VITALS — BP 114/77 | HR 102 | Wt 169.0 lb

## 2021-05-18 DIAGNOSIS — O26892 Other specified pregnancy related conditions, second trimester: Secondary | ICD-10-CM

## 2021-05-18 DIAGNOSIS — W19XXXA Unspecified fall, initial encounter: Secondary | ICD-10-CM

## 2021-05-18 DIAGNOSIS — O0990 Supervision of high risk pregnancy, unspecified, unspecified trimester: Secondary | ICD-10-CM | POA: Diagnosis not present

## 2021-05-18 DIAGNOSIS — O163 Unspecified maternal hypertension, third trimester: Secondary | ICD-10-CM | POA: Diagnosis not present

## 2021-05-18 DIAGNOSIS — O365990 Maternal care for other known or suspected poor fetal growth, unspecified trimester, not applicable or unspecified: Secondary | ICD-10-CM | POA: Diagnosis not present

## 2021-05-18 DIAGNOSIS — Z3491 Encounter for supervision of normal pregnancy, unspecified, first trimester: Secondary | ICD-10-CM

## 2021-05-18 DIAGNOSIS — R109 Unspecified abdominal pain: Secondary | ICD-10-CM

## 2021-05-18 DIAGNOSIS — Z3A32 32 weeks gestation of pregnancy: Secondary | ICD-10-CM | POA: Diagnosis not present

## 2021-05-18 DIAGNOSIS — O133 Gestational [pregnancy-induced] hypertension without significant proteinuria, third trimester: Secondary | ICD-10-CM | POA: Diagnosis not present

## 2021-05-18 DIAGNOSIS — N390 Urinary tract infection, site not specified: Secondary | ICD-10-CM

## 2021-05-18 NOTE — Progress Notes (Signed)
Pt fell on right side last night, has felt baby move, but did have an episode of pink tinged discharge last night, but nothing today.

## 2021-05-18 NOTE — Progress Notes (Signed)
° °  Transfer of care PRENATAL VISIT NOTE  Subjective:  Dominique Vega is a 31 y.o. G4P0030 at [redacted]w[redacted]d being seen today for ongoing prenatal care.  She is currently monitored for the following issues for this low-risk pregnancy and has Recurrent UTI (urinary tract infection); Pyelonephritis; Fever; Abdominal bloating; Diarrhea; Supervision of low-risk pregnancy, first trimester; Pregnancy complicated by obesity, first trimester; and Abdominal pain during pregnancy, second trimester on their problem list.  Patient reports  fell on right side, baby moving currently. Some pink discharge, no blood .  Contractions: Not present. Vag. Bleeding: None.  Movement: Present. Denies leaking of fluid.   The following portions of the patient's history were reviewed and updated as appropriate: allergies, current medications, past family history, past medical history, past social history, past surgical history and problem list.   Objective:   Vitals:   05/18/21 1455  BP: 114/77  Pulse: (!) 102  Weight: 169 lb (76.7 kg)    Fetal Status: Fetal Heart Rate (bpm): 152   Movement: Present     General:  Alert, oriented and cooperative. Patient is in no acute distress.  Skin: Skin is warm and dry. No rash noted.   Cardiovascular: Normal heart rate noted  Respiratory: Normal respiratory effort, no problems with respiration noted  Abdomen: Soft, gravid, appropriate for gestational age.  Pain/Pressure: Absent     Pelvic: Cervical exam deferred        Extremities: Normal range of motion.  Edema: None  Mental Status: Normal mood and affect. Normal behavior. Normal judgment and thought content.   Assessment and Plan:  Pregnancy: [id number] at [redacted]w[redacted]d 1. Supervision of low-risk pregnancy, first trimester Up to date Reviewed record Has [place] scheduled Reviewed plan of care and need for labs next visit  2. Recurrent UTI (urinary tract infection)  3. Abdominal pain during pregnancy, second trimester None today  4.  Fall, initial encounter No injuries FHR WNL and movement WNL Given precautions   Preterm labor symptoms and general obstetric precautions including but not limited to vaginal bleeding, contractions, leaking of fluid and fetal movement were reviewed in detail with the patient.  Please refer to After Visit Summary for other counseling recommendations.   Return in about 4 weeks (around 06/15/2021) for Routine prenatal care, 28 wk labs.  Future Appointments  Date Time Provider [place]  05/21/2021  2:30 PM [facility] NURSE [facility]  05/21/2021  2:45 PM [facility] US5 [facility]    Caren Macadam, MD

## 2021-05-20 ENCOUNTER — Encounter: Payer: Self-pay | Admitting: Family Medicine

## 2021-05-21 ENCOUNTER — Ambulatory Visit: Payer: BC Managed Care – PPO | Admitting: *Deleted

## 2021-05-21 ENCOUNTER — Other Ambulatory Visit: Payer: Self-pay

## 2021-05-21 ENCOUNTER — Ambulatory Visit: Payer: BC Managed Care – PPO | Attending: Obstetrics and Gynecology

## 2021-05-21 VITALS — BP 129/74 | HR 96

## 2021-05-21 DIAGNOSIS — Z3A24 24 weeks gestation of pregnancy: Secondary | ICD-10-CM | POA: Insufficient documentation

## 2021-05-21 DIAGNOSIS — O99211 Obesity complicating pregnancy, first trimester: Secondary | ICD-10-CM

## 2021-05-21 DIAGNOSIS — O09212 Supervision of pregnancy with history of pre-term labor, second trimester: Secondary | ICD-10-CM | POA: Diagnosis not present

## 2021-05-21 DIAGNOSIS — E668 Other obesity: Secondary | ICD-10-CM | POA: Diagnosis not present

## 2021-05-21 DIAGNOSIS — O09292 Supervision of pregnancy with other poor reproductive or obstetric history, second trimester: Secondary | ICD-10-CM | POA: Diagnosis not present

## 2021-05-21 DIAGNOSIS — Z363 Encounter for antenatal screening for malformations: Secondary | ICD-10-CM | POA: Insufficient documentation

## 2021-05-21 DIAGNOSIS — Z3491 Encounter for supervision of normal pregnancy, unspecified, first trimester: Secondary | ICD-10-CM

## 2021-05-21 DIAGNOSIS — Z3689 Encounter for other specified antenatal screening: Secondary | ICD-10-CM

## 2021-05-21 DIAGNOSIS — O99212 Obesity complicating pregnancy, second trimester: Secondary | ICD-10-CM | POA: Insufficient documentation

## 2021-05-24 ENCOUNTER — Encounter (HOSPITAL_COMMUNITY): Payer: Self-pay | Admitting: Obstetrics & Gynecology

## 2021-05-24 ENCOUNTER — Other Ambulatory Visit: Payer: Self-pay

## 2021-05-24 ENCOUNTER — Inpatient Hospital Stay (HOSPITAL_COMMUNITY): Payer: BC Managed Care – PPO

## 2021-05-24 ENCOUNTER — Inpatient Hospital Stay (HOSPITAL_COMMUNITY)
Admission: AD | Admit: 2021-05-24 | Discharge: 2021-05-25 | Disposition: A | Discharge status: Home / Self Care | Payer: BC Managed Care – PPO | Attending: Obstetrics & Gynecology | Admitting: Obstetrics & Gynecology

## 2021-05-24 DIAGNOSIS — S060X1A Concussion with loss of consciousness of 30 minutes or less, initial encounter: Secondary | ICD-10-CM | POA: Diagnosis not present

## 2021-05-24 DIAGNOSIS — O9A212 Injury, poisoning and certain other consequences of external causes complicating pregnancy, second trimester: Secondary | ICD-10-CM | POA: Insufficient documentation

## 2021-05-24 DIAGNOSIS — Z3491 Encounter for supervision of normal pregnancy, unspecified, first trimester: Secondary | ICD-10-CM

## 2021-05-24 DIAGNOSIS — R109 Unspecified abdominal pain: Secondary | ICD-10-CM | POA: Insufficient documentation

## 2021-05-24 DIAGNOSIS — O99612 Diseases of the digestive system complicating pregnancy, second trimester: Secondary | ICD-10-CM | POA: Insufficient documentation

## 2021-05-24 DIAGNOSIS — O212 Late vomiting of pregnancy: Secondary | ICD-10-CM | POA: Diagnosis not present

## 2021-05-24 DIAGNOSIS — K529 Noninfective gastroenteritis and colitis, unspecified: Secondary | ICD-10-CM

## 2021-05-24 DIAGNOSIS — Z3A25 25 weeks gestation of pregnancy: Secondary | ICD-10-CM | POA: Diagnosis not present

## 2021-05-24 DIAGNOSIS — O99512 Diseases of the respiratory system complicating pregnancy, second trimester: Secondary | ICD-10-CM | POA: Insufficient documentation

## 2021-05-24 DIAGNOSIS — R55 Syncope and collapse: Secondary | ICD-10-CM | POA: Diagnosis not present

## 2021-05-24 DIAGNOSIS — O99211 Obesity complicating pregnancy, first trimester: Secondary | ICD-10-CM

## 2021-05-24 DIAGNOSIS — Y939 Activity, unspecified: Secondary | ICD-10-CM | POA: Diagnosis not present

## 2021-05-24 DIAGNOSIS — W1839XA Other fall on same level, initial encounter: Secondary | ICD-10-CM | POA: Insufficient documentation

## 2021-05-24 DIAGNOSIS — J329 Chronic sinusitis, unspecified: Secondary | ICD-10-CM | POA: Diagnosis not present

## 2021-05-24 DIAGNOSIS — R4182 Altered mental status, unspecified: Secondary | ICD-10-CM | POA: Diagnosis not present

## 2021-05-24 MED ORDER — LACTATED RINGERS IV BOLUS
1000.0000 mL | Freq: Once | INTRAVENOUS | Status: AC
  Administered 2021-05-24: 1000 mL via INTRAVENOUS

## 2021-05-24 MED ORDER — DICYCLOMINE HCL 10 MG PO CAPS: 10.0000 mg | ORAL_CAPSULE | Freq: Once | ORAL | Status: DC

## 2021-05-24 MED ORDER — ONDANSETRON HCL 4 MG/2ML IJ SOLN
4.0000 mg | Freq: Once | INTRAMUSCULAR | Status: AC
  Administered 2021-05-24: 4 mg via INTRAVENOUS
  Filled 2021-05-24: qty 2

## 2021-05-24 NOTE — MAU Note (Signed)
Patient presents to MAU with reports of nausea/vomiting/diarrhea and syncope.  She had [employer] last night and started having diarrhea and abdominal pain.  She states she started feeling nauseated about 1 hr ago and states she was running to the bathroom to vomit and passed out.  Pt recalls it was witnessed by her husband and says she fell back and hit her head and elbow on the floor. She does not believe she hit her abdomen when she fell.  No CTX, LOF, no vaginal bleeding.

## 2021-05-24 NOTE — MAU Provider Note (Signed)
History     CSN: [id number]  Arrival date and time: 05/24/21 2204   Event Date/Time   First Provider Initiated Contact with Patient 05/24/21 2232      Chief Complaint  Patient presents with   Headache   Nausea   HPI Dominique Vega is a 31 y.o. G4P0030 at [redacted]w[redacted]d who presents for nausea vomiting and diarrhea.  Patient reports symptoms started last night after eating [employer] for dinner.  States she has had at least 7 episodes of watery stools and lost count how many times she vomited.  She took Zofran around 7 PM with no relief.  While walking to the bathroom she fell and landed on her back hitting the back of her head on the floor.  Per her husband she was "in and out of consciousness and delirious" after this happened.  Per patient, she does not remember what prompted her fall and does recall blacking out.  Reports pain in the back of her head that she rates 3/10.  Denies visual disturbance.  Also reports lower abdominal cramping that she rates 7/10.  Pain worse with vomiting and diarrhea.  Denies vaginal bleeding, fever, loss of fluid.  Reports positive fetal movement.  OB History     Gravida  4   Para      Term      Preterm      AB  3   Living         SAB  3   IAB      Ectopic      Multiple      Live Births              Past Medical History:  Diagnosis Date   Anxiety    Headache    History of miscarriage    Irritable bowel syndrome    Ovarian cyst    Pyelonephritis 01/25/2019   UTI (urinary tract infection)     Past Surgical History:  Procedure Laterality Date   COLONOSCOPY WITH PROPOFOL N/A 08/13/2020   Procedure: COLONOSCOPY WITH PROPOFOL;  Surgeon: [name], MD;  Location: [facility] ENDOSCOPY;  Service: Gastroenterology;  Laterality: N/A;   ESOPHAGOGASTRODUODENOSCOPY (EGD) WITH PROPOFOL N/A 08/13/2020   Procedure: ESOPHAGOGASTRODUODENOSCOPY (EGD) WITH PROPOFOL;  Surgeon: [name], MD;  Location: [facility] ENDOSCOPY;  Service:  Gastroenterology;  Laterality: N/A;    Family History  Problem Relation Age of Onset   Stroke Mother    Hypertension Mother    Stroke Father    Diabetes Father     Social History   Tobacco Use   Smoking status: Never   Smokeless tobacco: Never  Vaping Use   Vaping Use: Never used  Substance Use Topics   Alcohol use: Not Currently   Drug use: Never    Allergies:  Allergies  Allergen Reactions   Penicillins Diarrhea and Nausea And Vomiting    Medications Prior to Admission  Medication Sig Dispense Refill Last Dose   escitalopram (LEXAPRO) 20 MG tablet Take 20 mg by mouth daily.   05/24/2021   ondansetron (ZOFRAN-ODT) 4 MG disintegrating tablet Take 1 tablet (4 mg total) by mouth every 6 (six) hours as needed for nausea. 20 tablet 0 05/24/2021   Prenatal Vit-Fe Fumarate-FA (PNV PRENATAL PLUS MULTIVITAMIN) 27-1 MG TABS Take 1 tablet by mouth daily.   05/24/2021    Review of Systems  Constitutional: Negative.   Gastrointestinal:  Positive for abdominal pain, diarrhea, nausea and vomiting.  Genitourinary: Negative.  Neurological:  Positive for syncope and headaches.  Psychiatric/Behavioral:  Negative for confusion.   Physical Exam   Blood pressure (!) 113/56, pulse (!) 113, temperature 97.6 F (36.4 C), temperature source Oral, resp. rate 18, last menstrual period 11/29/2020, SpO2 100 %.  Physical Exam Vitals and nursing note reviewed.  Constitutional:      Appearance: She is ill-appearing and diaphoretic.  HENT:     Head: Normocephalic and atraumatic.     Comments: Crown TTP. No wound or masses Eyes:     General: No scleral icterus.    Extraocular Movements:     Right eye: No nystagmus.     Left eye: No nystagmus.     Pupils: Pupils are equal, round, and reactive to light.  Pulmonary:     Effort: Pulmonary effort is normal. No respiratory distress.  Abdominal:     Palpations: Abdomen is soft.     Tenderness: There is no abdominal tenderness. There is no  guarding.  Skin:    General: Skin is warm.  Neurological:     General: No focal deficit present.     Mental Status: She is alert and oriented to person, place, and time.     Cranial Nerves: No facial asymmetry.     Motor: No weakness or seizure activity.  Psychiatric:        Mood and Affect: Mood normal.        Speech: Speech normal.        Behavior: Behavior normal.   NST:  Baseline: 130 bpm, Variability: Good {> 6 bpm), Accelerations: 10x10, and Decelerations: Absent  MAU Course  Procedures No results found for this or any previous visit (from the past 24 hour(s)). CT HEAD WO CONTRAST (5MM)  Result Date: 05/24/2021 CLINICAL DATA:  Head trauma, abnormal mental status (Age 89-64y). Syncope. EXAM: CT HEAD WITHOUT CONTRAST TECHNIQUE: Contiguous axial images were obtained from the base of the skull through the vertex without intravenous contrast. RADIATION DOSE REDUCTION: This exam was performed according to the departmental dose-optimization program which includes automated exposure control, adjustment of the mA and/or kV according to patient size and/or use of iterative reconstruction technique. COMPARISON:  None. FINDINGS: Brain: No acute intracranial abnormality. Specifically, no hemorrhage, hydrocephalus, mass lesion, acute infarction, or significant intracranial injury. Vascular: No hyperdense vessel or unexpected calcification. Skull: No acute calvarial abnormality. Sinuses/Orbits: Mucosal thickening throughout the paranasal sinuses. No air-fluid levels. Other: None IMPRESSION: No acute intracranial abnormality. Chronic sinusitis. Electronically Signed   By: Rolm Baptise M.D.   On: 05/24/2021 23:36    MDM Patient presents with nausea, vomiting, diarrhea that started after eating [place] last night.  Unrelieved after taking Zofran at home.  [place] and hit back of her head on the floor while en route to the bathroom this evening resulting in loss of consciousness. She has a normal neuro  exam, A&O x4, and without memory loss.  Has a negative head CT.  Consulted with Dr. Leonel Ramsay with neurology; patient likely concussed, no further work-up necessary, can be managed outpatient.  Treatment in MAU included IV LR x 2 liters, zofran, phenergan, & meclizine. Reports marked improvement in symptoms. Stable for discharge home.   Assessment and Plan   1. Concussion with loss of consciousness of 30 minutes or less, initial encounter  -f/u with PCP if symptoms longer than 1 week -reviewed s/s that should prompt return to ED -Rx meclizine for dizziness  2. Gastroenteritis, acute  -rx phenergan -bland diet  3. [redacted] weeks  gestation of pregnancy      Jorje Guild 05/25/2021, 3:11 AM

## 2021-05-25 ENCOUNTER — Encounter: Payer: Self-pay | Admitting: Family Medicine

## 2021-05-25 DIAGNOSIS — S060X1A Concussion with loss of consciousness of 30 minutes or less, initial encounter: Secondary | ICD-10-CM

## 2021-05-25 DIAGNOSIS — K529 Noninfective gastroenteritis and colitis, unspecified: Secondary | ICD-10-CM

## 2021-05-25 DIAGNOSIS — Z3A25 25 weeks gestation of pregnancy: Secondary | ICD-10-CM | POA: Diagnosis not present

## 2021-05-25 MED ORDER — PROMETHAZINE HCL 25 MG PO TABS: 25.0000 mg | ORAL_TABLET | Freq: Four times a day (QID) | ORAL | 0 refills | Status: DC | PRN

## 2021-05-25 MED ORDER — MECLIZINE HCL 25 MG PO TABS
25.0000 mg | ORAL_TABLET | Freq: Once | ORAL | Status: AC
  Administered 2021-05-25: 25 mg via ORAL
  Filled 2021-05-25: qty 1

## 2021-05-25 MED ORDER — DICYCLOMINE HCL 10 MG PO CAPS
20.0000 mg | ORAL_CAPSULE | Freq: Once | ORAL | Status: AC
  Administered 2021-05-25: 20 mg via ORAL
  Filled 2021-05-25: qty 2

## 2021-05-25 MED ORDER — LACTATED RINGERS IV BOLUS
1000.0000 mL | Freq: Once | INTRAVENOUS | Status: AC
Start: 2021-05-25 — End: 2021-05-25
  Administered 2021-05-25: 1000 mL via INTRAVENOUS

## 2021-05-25 MED ORDER — SODIUM CHLORIDE 0.9 % IV SOLN
25.0000 mg | Freq: Once | INTRAVENOUS | Status: AC
  Administered 2021-05-25: 25 mg via INTRAVENOUS
  Filled 2021-05-25: qty 1

## 2021-05-25 MED ORDER — MECLIZINE HCL 25 MG PO TABS: 25.0000 mg | ORAL_TABLET | Freq: Three times a day (TID) | ORAL | 0 refills | Status: DC | PRN

## 2021-05-25 NOTE — Progress Notes (Signed)
Discussed discharge instructions with pt and husband; both verbalized understanding.

## 2021-05-26 NOTE — Progress Notes (Signed)
° ° °  Routine Prenatal Care Visit  Subjective  Dominique Vega is a 31 y.o. G4P0030 at [redacted]w[redacted]d being seen today for ongoing prenatal care.  She is currently monitored for the following issues for this low-risk pregnancy and has Recurrent UTI (urinary tract infection); Supervision of low-risk pregnancy, first trimester; Pregnancy complicated by obesity, first trimester; and Abdominal pain during pregnancy, second trimester on their problem list.  ----------------------------------------------------------------------------------- Patient reports  she continues to have leakage of fluid. She previously had a positive amnisure  .   Contractions: Not present. Vag. Bleeding: Scant.  Movement: Present. Denies leaking of fluid.  ----------------------------------------------------------------------------------- The following portions of the patient's history were reviewed and updated as appropriate: allergies, current medications, past family history, past medical history, past social history, past surgical history and problem list. Problem list updated.   Objective  Blood pressure 100/68, height 5' (1.524 m), weight 168 lb 6.4 oz (76.4 kg), last menstrual period 11/29/2020. Pregravid weight 167 lb (75.8 kg) Total Weight Gain 1 lb (0.454 kg) Urinalysis:      Fetal Status:     Movement: Present     General:  Alert, oriented and cooperative. Patient is in no acute distress.  Skin: Skin is warm and dry. No rash noted.   Cardiovascular: Normal heart rate noted  Respiratory: Normal respiratory effort, no problems with respiration noted  Abdomen: Soft, gravid, appropriate for gestational age. Pain/Pressure: Present     Pelvic:  Cervical exam deferred        Extremities: Normal range of motion.     Mental Status: Normal mood and affect. Normal behavior. Normal judgment and thought content.     Assessment   31 y.o. G4P0030 at [redacted]w[redacted]d by  09/05/2021, by Last Menstrual Period presenting for routine  prenatal visit  Plan   pregnancy4  Problems (from 01/29/21 to present)     Problem Noted Resolved   Supervision of low-risk pregnancy, first trimester 01/29/2021 by Nadara Mustard, MD No   Overview Addendum 05/18/2021  3:23 PM by Federico Flake, MD     Nursing Staff Provider  Office Location  [place] Dating  LMP and [place]  Language  English Anatomy US  MFM [place] nml  Flu Vaccine  UTD Genetic Screen  NIPS: low risk fem (Mat21)  TDaP vaccine    Hgb A1C or  GTT Third trimester :   Covid UTD   LAB RESULTS   Rhogam  N/A Blood Type A/Positive/-- (10/13 1508)   Feeding Plan Breast Antibody Negative (10/13 1508)  Contraception NFP Rubella <0.90 (10/13 1508)  Circumcision n/a RPR Non Reactive (10/13 1508)   Pediatrician   HBsAg Negative (10/13 1508)   Support Person  HIV Non Reactive (10/13 1508)  Prenatal Classes  Varicella     GBS  (For PCN allergy, check sensitivities)   BTL Consent No    VBAC Consent N/A Pap  03/2020  BMI 32      Pregnancy complicated by obesity, first trimester 01/29/2021 by Nadara Mustard, MD No       Sent to L&D for evaluation of LOF.  Gestational age appropriate obstetric precautions including but not limited to vaginal bleeding, contractions, leaking of fluid and fetal movement were reviewed in detail with the patient.    Return in about 1 week (around 04/23/2021) for Weekly HROB with MD for 4 weeks.  Natale Milch MD [facility], [facility] 05/26/2021, 10:18 AM

## 2021-05-28 DIAGNOSIS — R112 Nausea with vomiting, unspecified: Secondary | ICD-10-CM | POA: Diagnosis not present

## 2021-05-28 DIAGNOSIS — R55 Syncope and collapse: Secondary | ICD-10-CM | POA: Diagnosis not present

## 2021-05-28 DIAGNOSIS — S060X9D Concussion with loss of consciousness of unspecified duration, subsequent encounter: Secondary | ICD-10-CM | POA: Diagnosis not present

## 2021-05-28 DIAGNOSIS — Z3490 Encounter for supervision of normal pregnancy, unspecified, unspecified trimester: Secondary | ICD-10-CM | POA: Diagnosis not present

## 2021-06-03 ENCOUNTER — Encounter: Payer: Self-pay | Admitting: Family Medicine

## 2021-06-04 DIAGNOSIS — S060X9D Concussion with loss of consciousness of unspecified duration, subsequent encounter: Secondary | ICD-10-CM | POA: Diagnosis not present

## 2021-06-04 DIAGNOSIS — H6592 Unspecified nonsuppurative otitis media, left ear: Secondary | ICD-10-CM | POA: Diagnosis not present

## 2021-06-04 DIAGNOSIS — J00 Acute nasopharyngitis [common cold]: Secondary | ICD-10-CM | POA: Diagnosis not present

## 2021-06-04 DIAGNOSIS — G43909 Migraine, unspecified, not intractable, without status migrainosus: Secondary | ICD-10-CM | POA: Diagnosis not present

## 2021-06-17 ENCOUNTER — Ambulatory Visit (INDEPENDENT_AMBULATORY_CARE_PROVIDER_SITE_OTHER): Payer: BC Managed Care – PPO | Admitting: Obstetrics and Gynecology

## 2021-06-17 ENCOUNTER — Other Ambulatory Visit: Payer: BC Managed Care – PPO

## 2021-06-17 ENCOUNTER — Other Ambulatory Visit: Payer: Self-pay

## 2021-06-17 DIAGNOSIS — Z3491 Encounter for supervision of normal pregnancy, unspecified, first trimester: Secondary | ICD-10-CM

## 2021-06-17 DIAGNOSIS — Z23 Encounter for immunization: Secondary | ICD-10-CM | POA: Diagnosis not present

## 2021-06-17 DIAGNOSIS — Z3483 Encounter for supervision of other normal pregnancy, third trimester: Secondary | ICD-10-CM

## 2021-06-17 DIAGNOSIS — Z3A28 28 weeks gestation of pregnancy: Secondary | ICD-10-CM

## 2021-06-17 NOTE — Progress Notes (Signed)
Pt would like Tdap today

## 2021-06-17 NOTE — Patient Instructions (Signed)
Compression stockings Pregnancy belt

## 2021-06-17 NOTE — Progress Notes (Addendum)
° °  PRENATAL VISIT NOTE  Subjective:  Dominique Vega is a 31 y.o. G4P0030 at [redacted]w[redacted]d being seen today for ongoing prenatal care.  She is currently monitored for the following issues for this low-risk pregnancy and has Recurrent UTI (urinary tract infection); Supervision of low-risk pregnancy, first trimester; Pregnancy complicated by obesity, first trimester; and Abdominal pain during pregnancy, second trimester on their problem list.  Patient reports a syncopal event that pcp attributed to anemia.  Contractions: Not present. Vag. Bleeding: None.  Movement: Present. Denies leaking of fluid.   The following portions of the patient's history were reviewed and updated as appropriate: allergies, current medications, past family history, past medical history, past social history, past surgical history and problem list.   Objective:   Vitals:   06/17/21 0830  BP: 128/89  Pulse: 99  Weight: 176 lb (79.8 kg)    Fetal Status: Fetal Heart Rate (bpm): 152 Fundal Height: 28 cm Movement: Present     General:  Alert, oriented and cooperative. Patient is in no acute distress.  Skin: Skin is warm and dry. No rash noted.   Cardiovascular: Normal heart rate noted  Respiratory: Normal respiratory effort, no problems with respiration noted  Abdomen: Soft, gravid, appropriate for gestational age.  Pain/Pressure: Absent     Pelvic: Cervical exam deferred        Extremities: Normal range of motion.  Edema: Trace  Mental Status: Normal mood and affect. Normal behavior. Normal judgment and thought content.   Assessment and Plan:  Pregnancy: G4P0030 at [redacted]w[redacted]d 1. Supervision of low-risk pregnancy, first trimester Routine care. 28wk labs today Follow up labs from today for anemia. Recommend hydration, snacking q2h and adding some kosher/pink salt to diet  Preterm labor symptoms and general obstetric precautions including but not limited to vaginal bleeding, contractions, leaking of fluid and fetal movement  were reviewed in detail with the patient. Please refer to After Visit Summary for other counseling recommendations.   Return in about 2 weeks (around 07/01/2021) for low risk ob, md or app, in person.  Future Appointments  Date Time Provider Department Center  06/17/2021  9:35 AM [place] Bing, MD [facility]  07/01/2021  3:30 PM Reva Bores, MD [facility]  07/15/2021  3:30 PM Reva Bores, MD [facility]  07/29/2021  3:30 PM Reva Bores, MD [facility]  08/12/2021  3:30 PM [place] Bing, MD [facility]  08/19/2021  3:30 PM [place] Bing, MD [facility]  08/26/2021  3:30 PM Reva Bores, MD [facility]    [place] Bing, MD

## 2021-06-18 ENCOUNTER — Encounter: Payer: Self-pay | Admitting: Obstetrics and Gynecology

## 2021-06-18 LAB — CBC
Hematocrit: 35.9 % (ref 34.0–46.6)
Hemoglobin: 11.8 g/dL (ref 11.1–15.9)
MCH: 30.3 pg (ref 26.6–33.0)
MCHC: 32.9 g/dL (ref 31.5–35.7)
MCV: 92 fL (ref 79–97)
Platelets: 342 10*3/uL (ref 150–450)
RBC: 3.89 x10E6/uL (ref 3.77–5.28)
RDW: 13.1 % (ref 11.7–15.4)
WBC: 14.6 10*3/uL — ABNORMAL HIGH (ref 3.4–10.8)

## 2021-06-18 LAB — HIV ANTIBODY (ROUTINE TESTING W REFLEX): HIV Screen 4th Generation wRfx: NONREACTIVE

## 2021-06-18 LAB — GLUCOSE TOLERANCE, 2 HOURS W/ 1HR
Glucose, 1 hour: 172 mg/dL (ref 70–179)
Glucose, 2 hour: 104 mg/dL (ref 70–152)
Glucose, Fasting: 77 mg/dL (ref 70–91)

## 2021-06-18 LAB — RPR: RPR Ser Ql: NONREACTIVE

## 2021-06-28 ENCOUNTER — Encounter: Payer: Self-pay | Admitting: Family Medicine

## 2021-07-01 ENCOUNTER — Other Ambulatory Visit: Payer: Self-pay

## 2021-07-01 ENCOUNTER — Ambulatory Visit (INDEPENDENT_AMBULATORY_CARE_PROVIDER_SITE_OTHER): Payer: BC Managed Care – PPO | Admitting: Family Medicine

## 2021-07-01 VITALS — BP 137/84 | HR 96 | Wt 183.0 lb

## 2021-07-01 DIAGNOSIS — Z3403 Encounter for supervision of normal first pregnancy, third trimester: Secondary | ICD-10-CM

## 2021-07-01 DIAGNOSIS — O133 Gestational [pregnancy-induced] hypertension without significant proteinuria, third trimester: Secondary | ICD-10-CM | POA: Insufficient documentation

## 2021-07-01 DIAGNOSIS — O163 Unspecified maternal hypertension, third trimester: Secondary | ICD-10-CM | POA: Diagnosis not present

## 2021-07-01 DIAGNOSIS — R350 Frequency of micturition: Secondary | ICD-10-CM

## 2021-07-01 LAB — POCT URINALYSIS DIPSTICK
Appearance: NORMAL
Bilirubin, UA: NEGATIVE
Blood, UA: NEGATIVE
Glucose, UA: NEGATIVE
Ketones, UA: NEGATIVE
Leukocytes, UA: NEGATIVE
Protein, UA: NEGATIVE
Spec Grav, UA: 1.01 (ref 1.010–1.025)
Urobilinogen, UA: NEGATIVE E.U./dL — AB
pH, UA: 6.5 (ref 5.0–8.0)

## 2021-07-01 LAB — CBC
Hematocrit: 35 % (ref 34.0–46.6)
Hemoglobin: 11.5 g/dL (ref 11.1–15.9)
MCH: 30.1 pg (ref 26.6–33.0)
MCHC: 32.9 g/dL (ref 31.5–35.7)
MCV: 92 fL (ref 79–97)
Platelets: 367 10*3/uL (ref 150–450)
RBC: 3.82 x10E6/uL (ref 3.77–5.28)
RDW: 13.1 % (ref 11.7–15.4)
WBC: 15.7 10*3/uL — ABNORMAL HIGH (ref 3.4–10.8)

## 2021-07-01 NOTE — Progress Notes (Signed)
? ?PRENATAL VISIT NOTE ? ?Subjective:  ?Dominique Vega is a 31 y.o. G4P0030 at [redacted]w[redacted]d being seen today for ongoing prenatal care.  She is currently monitored for the following issues for this low-risk pregnancy and has Recurrent UTI (urinary tract infection); Encounter for supervision of normal first pregnancy in third trimester; Obesity affecting pregnancy, antepartum; Abdominal pain during pregnancy, second trimester; and Elevated blood pressure affecting pregnancy in third trimester, antepartum on their problem list. ? ?Patient reports headache and vision changes at times. Headache improves with tylenol. Mom with h/o Pre-eclampsia.  Contractions: Not present. Vag. Bleeding: None.  Movement: Present. Denies leaking of fluid.  ? ?The following portions of the patient's history were reviewed and updated as appropriate: allergies, current medications, past family history, past medical history, past social history, past surgical history and problem list.  ? ?Objective:  ? ?Vitals:  ? 07/01/21 1535  ?BP: 137/84  ?Pulse: 96  ?Weight: 183 lb (83 kg)  ? ? ?Fetal Status: Fetal Heart Rate (bpm): 154   Movement: Present    ? ?General:  Alert, oriented and cooperative. Patient is in no acute distress.  ?Skin: Skin is warm and dry. No rash noted.   ?Cardiovascular: Normal heart rate noted  ?Respiratory: Normal respiratory effort, no problems with respiration noted  ?Abdomen: Soft, gravid, appropriate for gestational age.  Pain/Pressure: Present     ?Pelvic: Cervical exam deferred        ?Extremities: Normal range of motion.  Edema: Trace  ?Mental Status: Normal mood and affect. Normal behavior. Normal judgment and thought content.  ? ?Assessment and Plan:  ?Pregnancy: G4P0030 at [redacted]w[redacted]d ?1. Elevated blood pressure affecting pregnancy in third trimester, antepartum ?BP at home: ?153/98 ?154/99 ?142/89 ?130/87 ?129/74 ?131/90 ?123/75 ?155/93 ?150/98 ?148/98 ?135/91 ?131/85 ?Labs and u/s for growth, weekly visits with labs if  remains pregnant. Warning signs reviewed as well as usual progression and plans for delivery. ?- CBC ?- Comprehensive metabolic panel ?- Protein / creatinine ratio, urine ?- TSH ?- [place] MFM OB FOLLOW UP; Future ? ?2. Encounter for supervision of normal first pregnancy in third trimester ? ? ?3. Urinary frequency ?- POCT Urinalysis Dipstick ?Urinalysis ?   ?Component Value Date/Time  ? COLORURINE YELLOW 04/16/2021 1228  ? APPEARANCEUR CLEAR (A) 04/16/2021 1228  ? LABSPEC 1.020 04/16/2021 1228  ? PHURINE 7.0 04/16/2021 1228  ? GLUCOSEU Negative 04/21/2021 1633  ? GLUCOSEU NEGATIVE 04/16/2021 1228  ? HGBUR NEGATIVE 04/16/2021 1228  ? BILIRUBINUR neg 07/01/2021 1603  ? [place] Atlas NEGATIVE 04/16/2021 1228  ? PROTEINUR Negative 07/01/2021 1603  ? PROTEINUR NEGATIVE 04/16/2021 1228  ? UROBILINOGEN negative (A) 07/01/2021 1603  ? NITRITE tr 07/01/2021 1603  ? NITRITE NEGATIVE 04/16/2021 1228  ? LEUKOCYTESUR Negative 07/01/2021 1603  ? LEUKOCYTESUR NEGATIVE 04/16/2021 1228  ? ? ? ? ?Preterm labor symptoms and general obstetric precautions including but not limited to vaginal bleeding, contractions, leaking of fluid and fetal movement were reviewed in detail with the patient. ?Please refer to After Visit Summary for other counseling recommendations.  ? ?Return in 1 week (on 07/08/2021) for [facility]. ? ?Future Appointments  ?Date Time Provider Department Center  ?07/06/2021  8:30 AM [facility] NURSE [facility]  ?07/06/2021  8:45 AM [facility] US6 [facility]  ?07/08/2021  3:10 PM Reva Bores, MD [facility]  ?07/15/2021  3:30 PM Reva Bores, MD [facility]  ?07/29/2021  3:30 PM Reva Bores, MD [facility]  ?08/12/2021  3:30 PM [place] Bing, MD [facility]  ?  08/19/2021  3:30 PM [place] Bing, MD [facility]  ?08/26/2021  3:30 PM Reva Bores, MD [facility]  ? ? ?Reva Bores, MD ? ?

## 2021-07-02 ENCOUNTER — Encounter: Payer: Self-pay | Admitting: Family Medicine

## 2021-07-02 LAB — COMPREHENSIVE METABOLIC PANEL
ALT: 8 IU/L (ref 0–32)
AST: 13 IU/L (ref 0–40)
Albumin/Globulin Ratio: 1.4 (ref 1.2–2.2)
Albumin: 3.6 g/dL — ABNORMAL LOW (ref 3.8–4.8)
Alkaline Phosphatase: 100 IU/L (ref 44–121)
BUN/Creatinine Ratio: 10 (ref 9–23)
BUN: 5 mg/dL — ABNORMAL LOW (ref 6–20)
Bilirubin Total: <0.2 mg/dL (ref 0.0–1.2)
CO2: 18 mmol/L — ABNORMAL LOW (ref 20–29)
Calcium: 8.9 mg/dL (ref 8.7–10.2)
Chloride: 105 mmol/L (ref 96–106)
Creatinine, Ser: 0.48 mg/dL — ABNORMAL LOW (ref 0.57–1.00)
Globulin, Total: 2.5 g/dL (ref 1.5–4.5)
Glucose: 76 mg/dL (ref 70–99)
Potassium: 4.5 mmol/L (ref 3.5–5.2)
Sodium: 137 mmol/L (ref 134–144)
Total Protein: 6.1 g/dL (ref 6.0–8.5)
eGFR: 130 mL/min/{1.73_m2} (ref >59)

## 2021-07-02 LAB — PROTEIN / CREATININE RATIO, URINE
Creatinine, Urine: 105.5 mg/dL
Protein, Ur: 11.9 mg/dL
Protein/Creat Ratio: 113 mg/g creat (ref 0–200)

## 2021-07-02 LAB — TSH: TSH: 2.05 u[IU]/mL (ref 0.450–4.500)

## 2021-07-05 ENCOUNTER — Encounter: Payer: Self-pay | Admitting: Family Medicine

## 2021-07-06 ENCOUNTER — Ambulatory Visit: Payer: BC Managed Care – PPO | Admitting: *Deleted

## 2021-07-06 ENCOUNTER — Other Ambulatory Visit: Payer: Self-pay | Admitting: Family Medicine

## 2021-07-06 ENCOUNTER — Ambulatory Visit: Payer: BC Managed Care – PPO | Attending: Family Medicine

## 2021-07-06 ENCOUNTER — Other Ambulatory Visit: Payer: Self-pay | Admitting: *Deleted

## 2021-07-06 ENCOUNTER — Other Ambulatory Visit: Payer: Self-pay

## 2021-07-06 ENCOUNTER — Encounter: Payer: Self-pay | Admitting: Family Medicine

## 2021-07-06 ENCOUNTER — Ambulatory Visit: Payer: BC Managed Care – PPO | Attending: Family Medicine | Admitting: Obstetrics and Gynecology

## 2021-07-06 VITALS — BP 144/95

## 2021-07-06 VITALS — BP 156/81 | HR 76

## 2021-07-06 DIAGNOSIS — Z362 Encounter for other antenatal screening follow-up: Secondary | ICD-10-CM | POA: Diagnosis not present

## 2021-07-06 DIAGNOSIS — Z6832 Body mass index (BMI) 32.0-32.9, adult: Secondary | ICD-10-CM | POA: Insufficient documentation

## 2021-07-06 DIAGNOSIS — O99213 Obesity complicating pregnancy, third trimester: Secondary | ICD-10-CM

## 2021-07-06 DIAGNOSIS — Z3A31 31 weeks gestation of pregnancy: Secondary | ICD-10-CM | POA: Diagnosis not present

## 2021-07-06 DIAGNOSIS — O2623 Pregnancy care for patient with recurrent pregnancy loss, third trimester: Secondary | ICD-10-CM | POA: Diagnosis not present

## 2021-07-06 DIAGNOSIS — Z3403 Encounter for supervision of normal first pregnancy, third trimester: Secondary | ICD-10-CM

## 2021-07-06 DIAGNOSIS — O365990 Maternal care for other known or suspected poor fetal growth, unspecified trimester, not applicable or unspecified: Secondary | ICD-10-CM

## 2021-07-06 DIAGNOSIS — O365930 Maternal care for other known or suspected poor fetal growth, third trimester, not applicable or unspecified: Secondary | ICD-10-CM | POA: Diagnosis not present

## 2021-07-06 DIAGNOSIS — O99210 Obesity complicating pregnancy, unspecified trimester: Secondary | ICD-10-CM

## 2021-07-06 DIAGNOSIS — O163 Unspecified maternal hypertension, third trimester: Secondary | ICD-10-CM

## 2021-07-06 DIAGNOSIS — E669 Obesity, unspecified: Secondary | ICD-10-CM

## 2021-07-06 DIAGNOSIS — O133 Gestational [pregnancy-induced] hypertension without significant proteinuria, third trimester: Secondary | ICD-10-CM

## 2021-07-06 NOTE — Progress Notes (Signed)
Maternal-Fetal Medicine  ? ?Name: Dominique Vega ?DOB: 1991-02-24 ?MRN: [id number] ?Referring Provider: Tinnie Gens, MD ? ?I had the pleasure of seeing Dominique [facility] today at [facility]. She is G4 P0030 at 31w 2d gestation and is here for ultrasound evaluation of fetal growth. ? ?Patient had increased blood pressures at your office consistent with gestational hypertension.  Labs including liver enzymes, creatinine and platelets were within normal range.  Protein/creatinine ratio was not increased. ? ?Patient reports occasional spots before eyes.  She does not have severe headache or right upper quadrant pain or vaginal bleeding today. ? ?Blood pressures today at her office were 156/81 and 140/95 mmHg. ? ?Patient does not have gestational diabetes.  On cell-free fetal DNA screening, the risks of fetal aneuploidies were not increased. ? ?Ultrasound ?On today's ultrasound, the estimated fetal weight is at the 6th percentile.  Abdominal circumference measurement is at the 21st percentile.  Head circumference measurement is between mean and -1 SD (normal).  Amniotic fluid is normal and good fetal activity seen.  Umbilical artery Doppler showed normal forward diastolic flow.  Antenatal testing is reassuring.  BPP 8/8. ? ?I counseled the couple on the following ?Fetal growth restriction ?Findings are consistent with fetal growth restriction.  Most likely causes gestational hypertension that leads to placental insufficiency.  Patient does not give history of fever or rashes and infectious causes unlikely.  In the absence of fetal anomalies, the likelihood of chromosomal anomalies or low. ? ?I discussed the ultrasound protocol of monitoring fetal growth restriction. ? ?Timing of delivery will be based both on interval fetal growth assessment, antenatal testing and severity of gestational hypertension/preeclampsia. ? ?Gestational hypertension ?I explained the diagnosis and that the management of gestational  hypertension/preeclampsia (in mild cases) do not differ.  Maternal complications include stroke, endorgan damage, coagulation disturbances and placental abruption. ? ?Patient has blood pressure cuff at home and will be monitoring her blood pressures.  I discussed the parameters that should prompt her to come to the hospital. ? ?Recommendations ?-Continue weekly BPP, NST and UA Doppler studies till delivery. ?-Delivery at [redacted] weeks gestation provided antenatal testing is reassuring. ?-Consider antihypertensive treatment if systolic blood pressures consistently above 155 and/or diastolic blood pressures above 100 mmHg.  Since planned delivery is remote from now, antihypertensive treatment may be beneficial. ? ? ? ?Thank you for consultation.  If you have any questions or concerns, please contact me the [facility].  Consultation including face-to-face (more than 50%) counseling 30 minutes. ? ?

## 2021-07-07 ENCOUNTER — Encounter: Payer: Self-pay | Admitting: Family Medicine

## 2021-07-07 DIAGNOSIS — Z8759 Personal history of other complications of pregnancy, childbirth and the puerperium: Secondary | ICD-10-CM | POA: Insufficient documentation

## 2021-07-07 DIAGNOSIS — O365990 Maternal care for other known or suspected poor fetal growth, unspecified trimester, not applicable or unspecified: Secondary | ICD-10-CM | POA: Insufficient documentation

## 2021-07-07 HISTORY — DX: Personal history of other complications of pregnancy, childbirth and the puerperium: Z87.59

## 2021-07-08 ENCOUNTER — Other Ambulatory Visit: Payer: Self-pay

## 2021-07-08 ENCOUNTER — Ambulatory Visit (INDEPENDENT_AMBULATORY_CARE_PROVIDER_SITE_OTHER): Payer: BC Managed Care – PPO | Admitting: Family Medicine

## 2021-07-08 VITALS — BP 146/96 | HR 96 | Wt 192.0 lb

## 2021-07-08 DIAGNOSIS — O163 Unspecified maternal hypertension, third trimester: Secondary | ICD-10-CM

## 2021-07-08 DIAGNOSIS — Z3403 Encounter for supervision of normal first pregnancy, third trimester: Secondary | ICD-10-CM

## 2021-07-08 DIAGNOSIS — O365990 Maternal care for other known or suspected poor fetal growth, unspecified trimester, not applicable or unspecified: Secondary | ICD-10-CM

## 2021-07-08 DIAGNOSIS — O133 Gestational [pregnancy-induced] hypertension without significant proteinuria, third trimester: Secondary | ICD-10-CM | POA: Diagnosis not present

## 2021-07-08 MED ORDER — LABETALOL HCL 200 MG PO TABS: 200.0000 mg | ORAL_TABLET | Freq: Two times a day (BID) | ORAL | 3 refills | Status: DC

## 2021-07-08 NOTE — Progress Notes (Signed)
? ?PRENATAL VISIT NOTE ? ?Subjective:  ?Dominique Vega is a 31 y.o. G4P0030 at 7325w4d being seen today for ongoing prenatal care.  She is currently monitored for the following issues for this high-risk pregnancy and has Recurrent UTI (urinary tract infection); Encounter for supervision of normal first pregnancy in third trimester; Obesity affecting pregnancy, antepartum; Abdominal pain during pregnancy, second trimester; Gestational hypertension w/o significant proteinuria in 3rd trimester; and Fetal growth restriction antepartum on their problem list. ? ?Patient reports no complaints.  Contractions: Not present. Vag. Bleeding: None.  Movement: Present. Denies leaking of fluid.  ? ?The following portions of the patient's history were reviewed and updated as appropriate: allergies, current medications, past family history, past medical history, past social history, past surgical history and problem list.  ? ?Objective:  ? ?Vitals:  ? 07/08/21 1521  ?BP: (!) 146/96  ?Pulse: 96  ?Weight: 192 lb (87.1 kg)  ? ? ?Fetal Status: Fetal Heart Rate (bpm): 158   Movement: Present    ? ?General:  Alert, oriented and cooperative. Patient is in no acute distress.  ?Skin: Skin is warm and dry. No rash noted.   ?Cardiovascular: Normal heart rate noted  ?Respiratory: Normal respiratory effort, no problems with respiration noted  ?Abdomen: Soft, gravid, appropriate for gestational age.  Pain/Pressure: Absent     ?Pelvic: Cervical exam deferred        ?Extremities: Normal range of motion.  Edema: Mild pitting, slight indentation  ?Mental Status: Normal mood and affect. Normal behavior. Normal judgment and thought content.  ? ?Assessment and Plan:  ?Pregnancy: G4P0030 at 7525w4d ?1. Encounter for supervision of high risk first pregnancy in third trimester ? ? ?2. Fetal growth restriction antepartum ?At 6%, normal fluid and dopplers ? ?3. Gestational hypertension w/o significant proteinuria in 3rd trimester ?Weekly labs, add meds to  help with BP management ?Warning and return signs and symptoms. ?- CBC ?- Protein / creatinine ratio, urine ?- Comprehensive metabolic panel ?- labetalol (NORMODYNE) 200 MG tablet; Take 1 tablet (200 mg total) by mouth 2 (two) times daily.  Dispense: 60 tablet; Refill: 3 ? ?Preterm labor symptoms and general obstetric precautions including but not limited to vaginal bleeding, contractions, leaking of fluid and fetal movement were reviewed in detail with the patient. ?Please refer to After Visit Summary for other counseling recommendations.  ? ?Return in about 1 week (around 07/15/2021). ? ?Future Appointments  ?Date Time Provider Department Center  ?07/15/2021  7:30 AM [facility] NURSE [facility]  ?07/15/2021  7:45 AM [facility] US5 [facility]  ?07/15/2021  8:45 AM [facility] NST [facility]  ?07/15/2021  3:30 PM Reva BoresPratt, Eliaz Fout S, MD [facility]  ?07/22/2021  7:30 AM [facility] NURSE [facility]  ?07/22/2021  7:45 AM [facility] US5 [facility]  ?07/22/2021  8:45 AM [facility] NST [facility]  ?07/22/2021  2:50 PM Reva BoresPratt, Aziah Brostrom S, MD [facility]  ?07/29/2021  7:30 AM [facility] NURSE [facility]  ?07/29/2021  7:45 AM [facility] US5 [facility]  ?07/29/2021  8:45 AM [facility] NST [facility]  ?07/29/2021  3:30 PM Reva BoresPratt, Caelum Federici S, MD [facility]  ?08/05/2021  7:30 AM [facility] NURSE [facility]  ?08/05/2021  7:45 AM [facility] US5 [facility]  ?08/05/2021  8:45 AM [facility] NST [facility]  ?08/05/2021  2:30 PM [place] BingPickens, Charlie, MD [facility]  ?08/12/2021  9:15 AM [facility] NURSE [facility]  ?08/12/2021  9:30 AM [facility] US3 [facility]  ?08/12/2021 10:45 AM [facility] NST [facility]  ?08/12/2021  3:30 PM  Aletha Halim, MD [facility]  ?08/19/2021  3:30 PM Aletha Halim, MD [facility]  ?08/26/2021  3:30 PM Donnamae Jude, MD [facility]  ? ? ?Donnamae Jude, MD ? ?

## 2021-07-09 ENCOUNTER — Encounter: Payer: Self-pay | Admitting: Family Medicine

## 2021-07-09 LAB — CBC
Hematocrit: 29.7 % — ABNORMAL LOW (ref 34.0–46.6)
Hemoglobin: 10.1 g/dL — ABNORMAL LOW (ref 11.1–15.9)
MCH: 30.8 pg (ref 26.6–33.0)
MCHC: 34 g/dL (ref 31.5–35.7)
MCV: 91 fL (ref 79–97)
Platelets: 315 10*3/uL (ref 150–450)
RBC: 3.28 x10E6/uL — ABNORMAL LOW (ref 3.77–5.28)
RDW: 12.9 % (ref 11.7–15.4)
WBC: 13.1 10*3/uL — ABNORMAL HIGH (ref 3.4–10.8)

## 2021-07-09 LAB — COMPREHENSIVE METABOLIC PANEL
ALT: 8 IU/L (ref 0–32)
AST: 15 IU/L (ref 0–40)
Albumin/Globulin Ratio: 1.6 (ref 1.2–2.2)
Albumin: 3.6 g/dL — ABNORMAL LOW (ref 3.8–4.8)
Alkaline Phosphatase: 94 IU/L (ref 44–121)
BUN/Creatinine Ratio: 13 (ref 9–23)
BUN: 6 mg/dL (ref 6–20)
Bilirubin Total: <0.2 mg/dL (ref 0.0–1.2)
CO2: 20 mmol/L (ref 20–29)
Calcium: 8.7 mg/dL (ref 8.7–10.2)
Chloride: 103 mmol/L (ref 96–106)
Creatinine, Ser: 0.45 mg/dL — ABNORMAL LOW (ref 0.57–1.00)
Globulin, Total: 2.3 g/dL (ref 1.5–4.5)
Glucose: 63 mg/dL — ABNORMAL LOW (ref 70–99)
Potassium: 4.3 mmol/L (ref 3.5–5.2)
Sodium: 136 mmol/L (ref 134–144)
Total Protein: 5.9 g/dL — ABNORMAL LOW (ref 6.0–8.5)
eGFR: 132 mL/min/{1.73_m2} (ref >59)

## 2021-07-09 LAB — PROTEIN / CREATININE RATIO, URINE
Creatinine, Urine: 27.2 mg/dL
Protein, Ur: 4.7 mg/dL
Protein/Creat Ratio: 173 mg/g creat (ref 0–200)

## 2021-07-14 ENCOUNTER — Encounter: Payer: Self-pay | Admitting: Family Medicine

## 2021-07-15 ENCOUNTER — Other Ambulatory Visit: Payer: Self-pay

## 2021-07-15 ENCOUNTER — Ambulatory Visit (HOSPITAL_BASED_OUTPATIENT_CLINIC_OR_DEPARTMENT_OTHER): Payer: BC Managed Care – PPO | Admitting: *Deleted

## 2021-07-15 ENCOUNTER — Ambulatory Visit (INDEPENDENT_AMBULATORY_CARE_PROVIDER_SITE_OTHER): Payer: BC Managed Care – PPO | Admitting: Family Medicine

## 2021-07-15 ENCOUNTER — Encounter: Payer: Self-pay | Admitting: *Deleted

## 2021-07-15 ENCOUNTER — Ambulatory Visit: Payer: BC Managed Care – PPO | Attending: Obstetrics and Gynecology

## 2021-07-15 ENCOUNTER — Ambulatory Visit: Payer: BC Managed Care – PPO | Admitting: *Deleted

## 2021-07-15 VITALS — BP 156/99 | HR 83 | Wt 194.0 lb

## 2021-07-15 VITALS — BP 153/90 | HR 89

## 2021-07-15 VITALS — BP 154/100 | HR 92

## 2021-07-15 DIAGNOSIS — O365990 Maternal care for other known or suspected poor fetal growth, unspecified trimester, not applicable or unspecified: Secondary | ICD-10-CM | POA: Diagnosis not present

## 2021-07-15 DIAGNOSIS — O365911 Maternal care for other known or suspected poor fetal growth, first trimester, fetus 1: Secondary | ICD-10-CM | POA: Insufficient documentation

## 2021-07-15 DIAGNOSIS — Z3A32 32 weeks gestation of pregnancy: Secondary | ICD-10-CM | POA: Insufficient documentation

## 2021-07-15 DIAGNOSIS — O365920 Maternal care for other known or suspected poor fetal growth, second trimester, not applicable or unspecified: Secondary | ICD-10-CM

## 2021-07-15 DIAGNOSIS — O99213 Obesity complicating pregnancy, third trimester: Secondary | ICD-10-CM | POA: Insufficient documentation

## 2021-07-15 DIAGNOSIS — Z3403 Encounter for supervision of normal first pregnancy, third trimester: Secondary | ICD-10-CM

## 2021-07-15 DIAGNOSIS — O365930 Maternal care for other known or suspected poor fetal growth, third trimester, not applicable or unspecified: Secondary | ICD-10-CM | POA: Diagnosis not present

## 2021-07-15 DIAGNOSIS — O99210 Obesity complicating pregnancy, unspecified trimester: Secondary | ICD-10-CM | POA: Insufficient documentation

## 2021-07-15 DIAGNOSIS — O133 Gestational [pregnancy-induced] hypertension without significant proteinuria, third trimester: Secondary | ICD-10-CM | POA: Diagnosis not present

## 2021-07-15 DIAGNOSIS — O2623 Pregnancy care for patient with recurrent pregnancy loss, third trimester: Secondary | ICD-10-CM | POA: Insufficient documentation

## 2021-07-15 DIAGNOSIS — Z362 Encounter for other antenatal screening follow-up: Secondary | ICD-10-CM | POA: Insufficient documentation

## 2021-07-15 LAB — COMPREHENSIVE METABOLIC PANEL
ALT: 8 IU/L (ref 0–32)
AST: 13 IU/L (ref 0–40)
Albumin/Globulin Ratio: 1.3 (ref 1.2–2.2)
Albumin: 3.6 g/dL — ABNORMAL LOW (ref 3.8–4.8)
Alkaline Phosphatase: 110 IU/L (ref 44–121)
BUN/Creatinine Ratio: 12 (ref 9–23)
BUN: 6 mg/dL (ref 6–20)
Bilirubin Total: 0.2 mg/dL (ref 0.0–1.2)
CO2: 18 mmol/L — ABNORMAL LOW (ref 20–29)
Calcium: 9.2 mg/dL (ref 8.7–10.2)
Chloride: 104 mmol/L (ref 96–106)
Creatinine, Ser: 0.51 mg/dL — ABNORMAL LOW (ref 0.57–1.00)
Globulin, Total: 2.7 g/dL (ref 1.5–4.5)
Glucose: 74 mg/dL (ref 70–99)
Potassium: 4.5 mmol/L (ref 3.5–5.2)
Sodium: 136 mmol/L (ref 134–144)
Total Protein: 6.3 g/dL (ref 6.0–8.5)
eGFR: 128 mL/min/{1.73_m2} (ref >59)

## 2021-07-15 LAB — CBC
Hematocrit: 32 % — ABNORMAL LOW (ref 34.0–46.6)
Hemoglobin: 10.6 g/dL — ABNORMAL LOW (ref 11.1–15.9)
MCH: 30.4 pg (ref 26.6–33.0)
MCHC: 33.1 g/dL (ref 31.5–35.7)
MCV: 92 fL (ref 79–97)
Platelets: 346 10*3/uL (ref 150–450)
RBC: 3.49 x10E6/uL — ABNORMAL LOW (ref 3.77–5.28)
RDW: 13.1 % (ref 11.7–15.4)
WBC: 15.1 10*3/uL — ABNORMAL HIGH (ref 3.4–10.8)

## 2021-07-15 MED ORDER — BETAMETHASONE SOD PHOS & ACET 6 (3-3) MG/ML IJ SUSP
12.0000 mg | Freq: Once | INTRAMUSCULAR | Status: AC
  Administered 2021-07-15: 12 mg via INTRAMUSCULAR

## 2021-07-15 MED ORDER — LABETALOL HCL 200 MG PO TABS: 400.0000 mg | ORAL_TABLET | Freq: Three times a day (TID) | ORAL | 1 refills | Status: DC

## 2021-07-15 NOTE — Progress Notes (Signed)
? ?PRENATAL VISIT NOTE ? ?Subjective:  ?Dominique Vega is a 31 y.o. G4P0030 at [redacted]w[redacted]d being seen today for ongoing prenatal care.  She is currently monitored for the following issues for this high-risk pregnancy and has Recurrent UTI (urinary tract infection); Encounter for supervision of normal first pregnancy in third trimester; Obesity affecting pregnancy, antepartum; Abdominal pain during pregnancy, second trimester; Gestational hypertension w/o significant proteinuria in 3rd trimester; and Fetal growth restriction antepartum on their problem list. ? ?Patient reports no complaints.  Contractions: Irritability. Vag. Bleeding: None.  Movement: Present. Denies leaking of fluid.  ? ?The following portions of the patient's history were reviewed and updated as appropriate: allergies, current medications, past family history, past medical history, past social history, past surgical history and problem list.  ? ?Objective:  ? ?Vitals:  ? 07/15/21 1553 07/15/21 1554  ?BP: (!) 165/109 (!) 156/99  ?Pulse:  83  ?Weight: 194 lb (88 kg)   ? ? ?Fetal Status: Fetal Heart Rate (bpm): 145   Movement: Present    ? ?General:  Alert, oriented and cooperative. Patient is in no acute distress.  ?Skin: Skin is warm and dry. No rash noted.   ?Cardiovascular: Normal heart rate noted  ?Respiratory: Normal respiratory effort, no problems with respiration noted  ?Abdomen: Soft, gravid, appropriate for gestational age.  Pain/Pressure: Absent     ?Pelvic: Cervical exam deferred        ?Extremities: Normal range of motion.  Edema: Trace  ?Mental Status: Normal mood and affect. Normal behavior. Normal judgment and thought content.  ? ?Assessment and Plan:  ?Pregnancy: [id number] at [redacted]w[redacted]d ?1. Gestational hypertension w/o significant proteinuria in 3rd trimester ?BP is worsening, will increase meds to 400 mg tid from 200 mg bid ?Warning signs reviewed--indications for delivery reviewed ?BMZ course started today ?- labetalol (NORMODYNE) 200 MG  tablet; Take 2 tablets (400 mg total) by mouth 3 (three) times daily.  Dispense: 180 tablet; Refill: 1 ?- CBC ?- Comprehensive metabolic panel ?- Protein / creatinine ratio, urine ? ?2. Encounter for supervision of normal first pregnancy in third trimester ? ? ?3. Fetal growth restriction antepartum ?In testing with MFM ?- betamethasone acetate-betamethasone sodium phosphate (CELESTONE) injection 12 mg ? ?Preterm labor symptoms and general obstetric precautions including but not limited to vaginal bleeding, contractions, leaking of fluid and fetal movement were reviewed in detail with the patient. ?Please refer to After Visit Summary for other counseling recommendations.  ? ?Return in 1 week (on 07/22/2021) for [facility] 1 day repeat BMZ. ? ?Future Appointments  ?Date Time Provider [place]  ?07/16/2021  3:50 PM [facility] NURSE [facility]  ?07/22/2021  7:30 AM [facility] NURSE [facility]  ?07/22/2021  7:45 AM [facility] US5 [facility]  ?07/22/2021  8:45 AM [facility] NST [facility]  ?07/22/2021  2:50 PM Donnamae Jude, MD [facility]  ?07/29/2021  7:30 AM [facility] NURSE [facility]  ?07/29/2021  7:45 AM [facility] US5 [facility]  ?07/29/2021  8:45 AM [facility] NST [facility]  ?07/29/2021  3:30 PM Donnamae Jude, MD [facility]  ?08/05/2021  7:30 AM [facility] NURSE [facility]  ?08/05/2021  7:45 AM [facility] US5 [facility]  ?08/05/2021  8:45 AM [facility] NST [facility] [place]  ?08/05/2021  2:30 PM Aletha Halim, MD [facility]  ?08/12/2021  9:15 AM [facility] NURSE [facility]  ?08/12/2021  9:30 AM [facility] US3 [facility]  ?08/12/2021 10:45 AM [facility] NST [facility]  ?08/12/2021  3:30 PM Aletha Halim, MD [facility]  ?08/19/2021  3:30 PM Aletha Halim, MD [facility]  ?08/26/2021  3:30 PM Donnamae Jude, MD [facility]  ? ? ?Donnamae Jude, MD ? ?

## 2021-07-15 NOTE — Progress Notes (Signed)
Reported the bp, h/a ,visual disturbances, edema in hands and feet to Dr. Grace Bushy. She has an OB visit today and is scheduled to have a u/a and blood work done at todays visit. ?

## 2021-07-15 NOTE — Procedures (Signed)
Dominique Vega ?1990/06/24 ?[redacted]w[redacted]d ? ?Fetus A Non-Stress Test Interpretation for 07/15/21 ? ?Indication: IUGR ? ?Fetal Heart Rate A ?Mode: External ?Baseline Rate (A): 140 bpm ?Variability: Moderate ?Accelerations: 15 x 15 ?Decelerations: None ?Multiple birth?: No ? ?Uterine Activity ?Mode: Toco ?Contraction Frequency (min): 2.5-7 ?Contraction Duration (sec): 70-110 ?Contraction Quality: Mild (pt reports they feel like a very mild cramp) ?Resting Tone Palpated: Relaxed ? ?Interpretation (Fetal Testing) ?Nonstress Test Interpretation: Reactive ?Overall Impression: Reassuring for gestational age ?Comments: tracing reviewed by Dr. Grace Bushy ? ? ?

## 2021-07-16 ENCOUNTER — Ambulatory Visit (INDEPENDENT_AMBULATORY_CARE_PROVIDER_SITE_OTHER): Payer: BC Managed Care – PPO | Admitting: *Deleted

## 2021-07-16 ENCOUNTER — Encounter: Payer: Self-pay | Admitting: Family Medicine

## 2021-07-16 DIAGNOSIS — Z3A32 32 weeks gestation of pregnancy: Secondary | ICD-10-CM

## 2021-07-16 DIAGNOSIS — O133 Gestational [pregnancy-induced] hypertension without significant proteinuria, third trimester: Secondary | ICD-10-CM | POA: Diagnosis not present

## 2021-07-16 LAB — PROTEIN / CREATININE RATIO, URINE
Creatinine, Urine: 100.8 mg/dL
Protein, Ur: 12.9 mg/dL
Protein/Creat Ratio: 128 mg/g creat (ref 0–200)

## 2021-07-16 MED ORDER — BETAMETHASONE SOD PHOS & ACET 6 (3-3) MG/ML IJ SUSP
12.0000 mg | Freq: Once | INTRAMUSCULAR | Status: AC
  Administered 2021-07-16: 12 mg via INTRAMUSCULAR

## 2021-07-16 NOTE — Progress Notes (Cosign Needed)
Pt here for 2nd betamethasone. Pt denies any issued or concerns at this time. Pt tolerated injection well.  ?

## 2021-07-17 NOTE — Progress Notes (Signed)
Patient seen and assessed by nursing staff.  Agree with documentation and plan.  

## 2021-07-19 ENCOUNTER — Encounter: Payer: Self-pay | Admitting: Family Medicine

## 2021-07-19 DIAGNOSIS — O133 Gestational [pregnancy-induced] hypertension without significant proteinuria, third trimester: Secondary | ICD-10-CM

## 2021-07-20 ENCOUNTER — Encounter: Payer: Self-pay | Admitting: Family Medicine

## 2021-07-20 MED ORDER — NIFEDIPINE ER OSMOTIC RELEASE 30 MG PO TB24: 30.0000 mg | ORAL_TABLET | Freq: Every day | ORAL | 1 refills | Status: DC

## 2021-07-20 MED ORDER — LABETALOL HCL 200 MG PO TABS: 600.0000 mg | ORAL_TABLET | Freq: Three times a day (TID) | ORAL | 1 refills | Status: DC

## 2021-07-21 ENCOUNTER — Encounter: Payer: Self-pay | Admitting: Family Medicine

## 2021-07-22 ENCOUNTER — Other Ambulatory Visit: Payer: Self-pay

## 2021-07-22 ENCOUNTER — Ambulatory Visit (INDEPENDENT_AMBULATORY_CARE_PROVIDER_SITE_OTHER): Payer: BC Managed Care – PPO | Admitting: Family Medicine

## 2021-07-22 ENCOUNTER — Ambulatory Visit: Payer: BC Managed Care – PPO | Admitting: *Deleted

## 2021-07-22 ENCOUNTER — Encounter: Payer: Self-pay | Admitting: Family Medicine

## 2021-07-22 ENCOUNTER — Ambulatory Visit: Payer: BC Managed Care – PPO | Attending: Obstetrics and Gynecology

## 2021-07-22 VITALS — BP 131/86 | HR 103 | Wt 185.2 lb

## 2021-07-22 VITALS — BP 139/84 | HR 90

## 2021-07-22 DIAGNOSIS — O365931 Maternal care for other known or suspected poor fetal growth, third trimester, fetus 1: Secondary | ICD-10-CM | POA: Insufficient documentation

## 2021-07-22 DIAGNOSIS — O2623 Pregnancy care for patient with recurrent pregnancy loss, third trimester: Secondary | ICD-10-CM | POA: Diagnosis not present

## 2021-07-22 DIAGNOSIS — O99213 Obesity complicating pregnancy, third trimester: Secondary | ICD-10-CM

## 2021-07-22 DIAGNOSIS — Z362 Encounter for other antenatal screening follow-up: Secondary | ICD-10-CM

## 2021-07-22 DIAGNOSIS — O365930 Maternal care for other known or suspected poor fetal growth, third trimester, not applicable or unspecified: Secondary | ICD-10-CM

## 2021-07-22 DIAGNOSIS — Z3A33 33 weeks gestation of pregnancy: Secondary | ICD-10-CM

## 2021-07-22 DIAGNOSIS — O133 Gestational [pregnancy-induced] hypertension without significant proteinuria, third trimester: Secondary | ICD-10-CM

## 2021-07-22 DIAGNOSIS — E669 Obesity, unspecified: Secondary | ICD-10-CM | POA: Diagnosis not present

## 2021-07-22 DIAGNOSIS — O365990 Maternal care for other known or suspected poor fetal growth, unspecified trimester, not applicable or unspecified: Secondary | ICD-10-CM | POA: Diagnosis not present

## 2021-07-22 DIAGNOSIS — O0990 Supervision of high risk pregnancy, unspecified, unspecified trimester: Secondary | ICD-10-CM

## 2021-07-22 NOTE — Procedures (Signed)
Dominique Vega ?30-Aug-1990 ?[redacted]w[redacted]d ? ?Fetus A Non-Stress Test Interpretation for 07/22/21 ? ?Indication: IUGR ? ?Fetal Heart Rate A ?Mode: External ?Baseline Rate (A): 145 bpm ?Variability: Moderate ?Accelerations: 10 x 10, 15 x 15 (one 15X15 IN 33 MIN) ?Decelerations: None ?Multiple birth?: No ? ?Uterine Activity ?Mode: Toco ?Contraction Frequency (min): UI ?Contraction Quality: Mild ?Resting Tone Palpated: Relaxed ? ?Interpretation (Fetal Testing) ?Nonstress Test Interpretation: Non-reactive ?Overall Impression: Reassuring for gestational age ?Comments: TRACING REVIEWED BY DR. [facility] ? ? ?

## 2021-07-22 NOTE — Patient Instructions (Signed)
Preeclampsia and Eclampsia  Preeclampsia is a serious condition that may develop during pregnancy. This condition involves high blood pressure during pregnancy and causes symptoms such as headaches, vision changes, and increased swelling in the legs, hands, and face. Preeclampsia occurs after 20 weeks of pregnancy. Eclampsia is a seizure that happens from worsening preeclampsia. Diagnosing and managing preeclampsia early is important. If not treated early, it can cause serious problems for mother and baby.  There is no cure for this condition. However, during pregnancy, delivering the baby may be the best treatment for preeclampsia or eclampsia. For most women, symptoms of preeclampsia and eclampsia go away after giving birth. In rare cases, a woman may develop preeclampsia or eclampsia after giving birth. This usually occurs within 48 hours after childbirth but may occur up to 6 weeks after giving birth.  What are the causes?  The cause of this condition is not known.  What increases the risk?  The following factors make you more likely to develop preeclampsia:  Being pregnant for the first time or being pregnant with multiples.  Having had preeclampsia or a condition called hemolysis, elevated liver enzymes, and low platelet count (HELLP)syndrome during a past pregnancy.  Having a family history of preeclampsia.  Being older than age 35.  Being obese.  Becoming pregnant through fertility treatments.  Conditions that reduce blood flow or oxygen to your placenta and baby may also increase your risk. These include:  High blood pressure before, during, or immediately following pregnancy.  Kidney disease.  Diabetes.  Blood clotting disorders.  Autoimmune diseases, such as lupus.  Sleep apnea.  What are the signs or symptoms?  Common symptoms of this condition include:  A severe, throbbing headache that does not go away.  Vision problems, such as blurred or double vision and light sensitivity.  Pain in the stomach,  especially the right upper region.  Pain in the shoulder.  Other symptoms that may develop as the condition gets worse include:  Sudden weight gain because of fluid buildup in the body. This causes swelling of the face, hands, legs, and feet.  Severe nausea and vomiting.  Urinating less than usual.  Shortness of breath.  Seizures.  How is this diagnosed?    Your health care provider will ask you about symptoms and check for signs of preeclampsia during your prenatal visits. You will also have routine tests, including:  Checking your blood pressure.  Urine tests to check for protein.  Blood tests to assess your organ function.  Monitoring your baby's heart rate.  Ultrasounds to check fetal growth.  How is this treated?  You and your health care provider will determine the treatment that is best for you. Treatment may include:  Frequent prenatal visits to check for preeclampsia.  Medicine to lower your blood pressure.  Medicine to prevent seizures.  Low-dose aspirin during your pregnancy.  Staying in the hospital, in severe cases. You will be given medicines to control your blood pressure and the amount of fluids in your body.  Delivering your baby.  Work with your health care provider to manage any chronic health conditions, such as diabetes or kidney problems. Also, work with your health care provider to manage weight gain during pregnancy.  Follow these instructions at home:  Eating and drinking  Drink enough fluid to keep your urine pale yellow.  Avoid caffeine. Caffeine may increase blood pressure and heart rate and lead to dehydration.  Reduce the amount of salt that you eat.  Lifestyle    Do not use any products that contain nicotine or tobacco. These products include cigarettes, chewing tobacco, and vaping devices, such as e-cigarettes. If you need help quitting, ask your health care provider.  Do not use alcohol or drugs.  Avoid stress as much as possible.  Rest and get plenty of sleep.  General  instructions    Take over-the-counter and prescription medicines only as told by your health care provider.  When lying down, lie on your left side. This keeps pressure off your major blood vessels.  When sitting or lying down, raise (elevate) your feet. Try putting pillows underneath your lower legs.  Exercise regularly. Ask your health care provider what kinds of exercise are best for you.  Check your blood pressure as often as recommended by your health care provider.  Keep all prenatal and follow-up visits. This is important.  Contact a health care provider if:  You have symptoms that may need treatment or closer monitoring. These include:  Headaches.  Stomach pain or nausea and vomiting.  Shoulder pain.  Vision problems, such as spots in front of your eyes or blurry vision.  Sudden weight gain or increased swelling in your face, hands, legs, and feet.  Increased anxiety or feeling of impending doom.  Signs or symptoms of labor.  Get help right away if:  You have any of the following symptoms:  A seizure.  Shortness of breath or trouble breathing.  Trouble speaking or slurred speech.  Fainting.  Chest pain.  These symptoms may represent a serious problem that is an emergency. Do not wait to see if the symptoms will go away. Get medical help right away. Call your local emergency services (911 in the [place] Do not drive yourself to the hospital.  Summary  Preeclampsia is a serious condition that may develop during pregnancy.  Diagnosing and treating preeclampsia early is very important.  Keep all prenatal and follow-up visits. This is important.  Get help right away if you have a seizure, shortness of breath or trouble breathing, trouble speaking or slurred speech, chest pain, or fainting.  This information is not intended to replace advice given to you by your health care provider. Make sure you discuss any questions you have with your health care provider.  Document Revised: 01/10/2020 Document Reviewed:  01/10/2020  Elsevier Patient Education  2022 [employer]

## 2021-07-22 NOTE — Progress Notes (Signed)
? ?  PRENATAL VISIT NOTE ? ?Subjective:  ?Dominique Vega is a 31 y.o. G4P0030 at 104w4d being seen today for ongoing prenatal care.  She is currently monitored for the following issues for this high-risk pregnancy and has Recurrent UTI (urinary tract infection); Supervision of high risk pregnancy, antepartum; Obesity affecting pregnancy, antepartum; Abdominal pain during pregnancy, second trimester; Gestational hypertension w/o significant proteinuria in 3rd trimester; and Fetal growth restriction antepartum on their problem list. ? ?Patient reports backache.  Contractions: Irritability. Vag. Bleeding: None.  Movement: Present. Denies leaking of fluid.  ? ?The following portions of the patient's history were reviewed and updated as appropriate: allergies, current medications, past family history, past medical history, past social history, past surgical history and problem list.  ? ?Objective:  ? ?Vitals:  ? 07/22/21 1529  ?BP: 131/86  ?Pulse: (!) 103  ?Weight: 185 lb 3.2 oz (84 kg)  ? ? ?Fetal Status: Fetal Heart Rate (bpm): 147 Fundal Height: 33 cm Movement: Present  Presentation: Vertex ? ?General:  Alert, oriented and cooperative. Patient is in no acute distress.  ?Skin: Skin is warm and dry. No rash noted.   ?Cardiovascular: Normal heart rate noted  ?Respiratory: Normal respiratory effort, no problems with respiration noted  ?Abdomen: Soft, gravid, appropriate for gestational age.  Pain/Pressure: Present     ?Pelvic: Cervical exam deferred        ?Extremities: Normal range of motion.  Edema: Trace  ?Mental Status: Normal mood and affect. Normal behavior. Normal judgment and thought content.  ? ?Assessment and Plan:  ?Pregnancy: G4P0030 at [redacted]w[redacted]d ?1. Gestational hypertension w/o significant proteinuria in 3rd trimester ?BP is good on Labetalol 600 mg tid. In weekly testing and getting weekly labs ?Has procardia to add if needed. No proteinuria as yet. ?No PIH sx's ?S/p BMZ x 2 ?- CBC ?- Comprehensive metabolic  panel ?- Protein / creatinine ratio, urine ? ?2. Supervision of high risk pregnancy, antepartum ?Having some GERD--may take PPI, if worsens, may need to go in to hospital or if labs appear worse. ? ?3. Fetal growth restriction antepartum ?In testing ? ?Preterm labor symptoms and general obstetric precautions including but not limited to vaginal bleeding, contractions, leaking of fluid and fetal movement were reviewed in detail with the patient. ?Please refer to After Visit Summary for other counseling recommendations.  ? ?Return in 1 week (on 07/29/2021). ? ?Future Appointments  ?Date Time Provider [place]  ?07/29/2021  7:30 AM [facility] NURSE [facility]  ?07/29/2021  7:45 AM [facility] US5 [facility]  ?07/29/2021  8:45 AM [facility] NST [facility]  ?07/29/2021  3:30 PM Donnamae Jude, MD [facility]  ?08/05/2021  7:30 AM [facility] NURSE [facility]  ?08/05/2021  7:45 AM [facility] US5 [facility]  ?08/05/2021  8:45 AM [facility] NST [facility] [place]  ?08/05/2021  2:30 PM Aletha Halim, MD [facility]  ?08/12/2021  9:15 AM [facility] NURSE [facility]  ?08/12/2021  9:30 AM [facility] US3 [facility]  ?08/12/2021 10:45 AM [facility] NST [facility]  ?08/12/2021  3:30 PM Aletha Halim, MD [facility]  ?08/19/2021  3:30 PM Aletha Halim, MD [facility]  ?08/26/2021  3:30 PM Donnamae Jude, MD [facility]  ? ? ?Donnamae Jude, MD ? ?

## 2021-07-23 ENCOUNTER — Encounter: Payer: Self-pay | Admitting: Family Medicine

## 2021-07-23 LAB — COMPREHENSIVE METABOLIC PANEL
ALT: 8 IU/L (ref 0–32)
AST: 14 IU/L (ref 0–40)
Albumin/Globulin Ratio: 1.2 (ref 1.2–2.2)
Albumin: 3.6 g/dL — ABNORMAL LOW (ref 3.8–4.8)
Alkaline Phosphatase: 118 IU/L (ref 44–121)
BUN/Creatinine Ratio: 11 (ref 9–23)
BUN: 6 mg/dL (ref 6–20)
Bilirubin Total: <0.2 mg/dL (ref 0.0–1.2)
CO2: 18 mmol/L — ABNORMAL LOW (ref 20–29)
Calcium: 9.3 mg/dL (ref 8.7–10.2)
Chloride: 104 mmol/L (ref 96–106)
Creatinine, Ser: 0.53 mg/dL — ABNORMAL LOW (ref 0.57–1.00)
Globulin, Total: 2.9 g/dL (ref 1.5–4.5)
Glucose: 71 mg/dL (ref 70–99)
Potassium: 4.7 mmol/L (ref 3.5–5.2)
Sodium: 138 mmol/L (ref 134–144)
Total Protein: 6.5 g/dL (ref 6.0–8.5)
eGFR: 127 mL/min/{1.73_m2} (ref >59)

## 2021-07-23 LAB — CBC
Hematocrit: 35 % (ref 34.0–46.6)
Hemoglobin: 11.5 g/dL (ref 11.1–15.9)
MCH: 29.6 pg (ref 26.6–33.0)
MCHC: 32.9 g/dL (ref 31.5–35.7)
MCV: 90 fL (ref 79–97)
Platelets: 438 10*3/uL (ref 150–450)
RBC: 3.88 x10E6/uL (ref 3.77–5.28)
RDW: 13.1 % (ref 11.7–15.4)
WBC: 16.8 10*3/uL — ABNORMAL HIGH (ref 3.4–10.8)

## 2021-07-23 LAB — PROTEIN / CREATININE RATIO, URINE
Creatinine, Urine: 136.1 mg/dL
Protein, Ur: 16.5 mg/dL
Protein/Creat Ratio: 121 mg/g creat (ref 0–200)

## 2021-07-26 ENCOUNTER — Encounter: Payer: Self-pay | Admitting: Family Medicine

## 2021-07-29 ENCOUNTER — Encounter (HOSPITAL_COMMUNITY): Payer: Self-pay | Admitting: Family Medicine

## 2021-07-29 ENCOUNTER — Other Ambulatory Visit: Payer: Self-pay | Admitting: Family Medicine

## 2021-07-29 ENCOUNTER — Inpatient Hospital Stay (HOSPITAL_COMMUNITY)
Admission: AD | Admit: 2021-07-29 | Discharge: 2021-08-03 | DRG: 788 | Disposition: A | Discharge status: Home / Self Care | Payer: BC Managed Care – PPO | Attending: Obstetrics and Gynecology | Admitting: Obstetrics and Gynecology

## 2021-07-29 ENCOUNTER — Encounter: Payer: Self-pay | Admitting: *Deleted

## 2021-07-29 ENCOUNTER — Ambulatory Visit (HOSPITAL_BASED_OUTPATIENT_CLINIC_OR_DEPARTMENT_OTHER): Payer: BC Managed Care – PPO

## 2021-07-29 ENCOUNTER — Ambulatory Visit: Payer: BC Managed Care – PPO | Admitting: *Deleted

## 2021-07-29 ENCOUNTER — Other Ambulatory Visit: Payer: Self-pay

## 2021-07-29 ENCOUNTER — Ambulatory Visit (INDEPENDENT_AMBULATORY_CARE_PROVIDER_SITE_OTHER): Payer: BC Managed Care – PPO | Admitting: Family Medicine

## 2021-07-29 VITALS — BP 133/87 | HR 93

## 2021-07-29 VITALS — BP 133/87 | HR 98 | Wt 195.0 lb

## 2021-07-29 DIAGNOSIS — Z8759 Personal history of other complications of pregnancy, childbirth and the puerperium: Secondary | ICD-10-CM | POA: Diagnosis present

## 2021-07-29 DIAGNOSIS — Z3A34 34 weeks gestation of pregnancy: Secondary | ICD-10-CM

## 2021-07-29 DIAGNOSIS — O99214 Obesity complicating childbirth: Secondary | ICD-10-CM | POA: Diagnosis present

## 2021-07-29 DIAGNOSIS — F419 Anxiety disorder, unspecified: Secondary | ICD-10-CM | POA: Diagnosis not present

## 2021-07-29 DIAGNOSIS — O9902 Anemia complicating childbirth: Secondary | ICD-10-CM | POA: Diagnosis present

## 2021-07-29 DIAGNOSIS — O133 Gestational [pregnancy-induced] hypertension without significant proteinuria, third trimester: Secondary | ICD-10-CM

## 2021-07-29 DIAGNOSIS — Z362 Encounter for other antenatal screening follow-up: Secondary | ICD-10-CM

## 2021-07-29 DIAGNOSIS — O365990 Maternal care for other known or suspected poor fetal growth, unspecified trimester, not applicable or unspecified: Secondary | ICD-10-CM | POA: Insufficient documentation

## 2021-07-29 DIAGNOSIS — O365930 Maternal care for other known or suspected poor fetal growth, third trimester, not applicable or unspecified: Secondary | ICD-10-CM | POA: Diagnosis not present

## 2021-07-29 DIAGNOSIS — O1414 Severe pre-eclampsia complicating childbirth: Principal | ICD-10-CM | POA: Diagnosis present

## 2021-07-29 DIAGNOSIS — O1410 Severe pre-eclampsia, unspecified trimester: Secondary | ICD-10-CM | POA: Diagnosis not present

## 2021-07-29 DIAGNOSIS — O0990 Supervision of high risk pregnancy, unspecified, unspecified trimester: Secondary | ICD-10-CM | POA: Insufficient documentation

## 2021-07-29 DIAGNOSIS — O99213 Obesity complicating pregnancy, third trimester: Secondary | ICD-10-CM | POA: Diagnosis not present

## 2021-07-29 DIAGNOSIS — O09899 Supervision of other high risk pregnancies, unspecified trimester: Secondary | ICD-10-CM

## 2021-07-29 DIAGNOSIS — O99210 Obesity complicating pregnancy, unspecified trimester: Secondary | ICD-10-CM | POA: Insufficient documentation

## 2021-07-29 DIAGNOSIS — Z2839 Other underimmunization status: Secondary | ICD-10-CM

## 2021-07-29 DIAGNOSIS — O99344 Other mental disorders complicating childbirth: Secondary | ICD-10-CM | POA: Diagnosis not present

## 2021-07-29 DIAGNOSIS — R519 Headache, unspecified: Secondary | ICD-10-CM | POA: Diagnosis not present

## 2021-07-29 DIAGNOSIS — Z23 Encounter for immunization: Secondary | ICD-10-CM

## 2021-07-29 DIAGNOSIS — Z98891 History of uterine scar from previous surgery: Secondary | ICD-10-CM

## 2021-07-29 DIAGNOSIS — O2623 Pregnancy care for patient with recurrent pregnancy loss, third trimester: Secondary | ICD-10-CM | POA: Diagnosis not present

## 2021-07-29 DIAGNOSIS — Z4682 Encounter for fitting and adjustment of non-vascular catheter: Secondary | ICD-10-CM | POA: Diagnosis not present

## 2021-07-29 DIAGNOSIS — Z452 Encounter for adjustment and management of vascular access device: Secondary | ICD-10-CM | POA: Diagnosis not present

## 2021-07-29 DIAGNOSIS — O1494 Unspecified pre-eclampsia, complicating childbirth: Secondary | ICD-10-CM | POA: Diagnosis not present

## 2021-07-29 DIAGNOSIS — R918 Other nonspecific abnormal finding of lung field: Secondary | ICD-10-CM | POA: Diagnosis not present

## 2021-07-29 DIAGNOSIS — E669 Obesity, unspecified: Secondary | ICD-10-CM

## 2021-07-29 HISTORY — DX: Personal history of other complications of pregnancy, childbirth and the puerperium: Z87.59

## 2021-07-29 LAB — COMPREHENSIVE METABOLIC PANEL
ALT: 14 U/L (ref 0–44)
AST: 22 U/L (ref 15–41)
Albumin: 2.6 g/dL — ABNORMAL LOW (ref 3.5–5.0)
Alkaline Phosphatase: 87 U/L (ref 38–126)
Anion gap: 7 (ref 5–15)
BUN: 8 mg/dL (ref 6–20)
CO2: 19 mmol/L — ABNORMAL LOW (ref 22–32)
Calcium: 9 mg/dL (ref 8.9–10.3)
Chloride: 109 mmol/L (ref 98–111)
Creatinine, Ser: 0.5 mg/dL (ref 0.44–1.00)
GFR, Estimated: >60 mL/min (ref >60)
Glucose, Bld: 93 mg/dL (ref 70–99)
Potassium: 3.8 mmol/L (ref 3.5–5.1)
Sodium: 135 mmol/L (ref 135–145)
Total Bilirubin: 0.5 mg/dL (ref 0.3–1.2)
Total Protein: 5.7 g/dL — ABNORMAL LOW (ref 6.5–8.1)

## 2021-07-29 LAB — CBC
HCT: 31.2 % — ABNORMAL LOW (ref 36.0–46.0)
Hemoglobin: 10.6 g/dL — ABNORMAL LOW (ref 12.0–15.0)
MCH: 30.7 pg (ref 26.0–34.0)
MCHC: 34 g/dL (ref 30.0–36.0)
MCV: 90.4 fL (ref 80.0–100.0)
Platelets: 363 10*3/uL (ref 150–400)
RBC: 3.45 MIL/uL — ABNORMAL LOW (ref 3.87–5.11)
RDW: 13.9 % (ref 11.5–15.5)
WBC: 14.2 10*3/uL — ABNORMAL HIGH (ref 4.0–10.5)
nRBC: 0 % (ref 0.0–0.2)

## 2021-07-29 LAB — TYPE AND SCREEN
ABO/RH(D): A POS
Antibody Screen: NEGATIVE

## 2021-07-29 LAB — PROTEIN / CREATININE RATIO, URINE
Creatinine, Urine: 94.15 mg/dL
Protein Creatinine Ratio: 0.18 mg/mg{Cre} — ABNORMAL HIGH (ref 0.00–0.15)
Total Protein, Urine: 17 mg/dL

## 2021-07-29 MED ORDER — FENTANYL CITRATE (PF) 100 MCG/2ML IJ SOLN: 100.0000 ug | INTRAMUSCULAR | Status: DC | PRN

## 2021-07-29 MED ORDER — LABETALOL HCL 5 MG/ML IV SOLN: 20.0000 mg | INTRAVENOUS | Status: DC | PRN

## 2021-07-29 MED ORDER — ACETAMINOPHEN 325 MG PO TABS
650.0000 mg | ORAL_TABLET | ORAL | Status: DC | PRN
Start: 2021-07-29 — End: 2021-07-29
  Administered 2021-07-29: 650 mg via ORAL
  Filled 2021-07-29: qty 2

## 2021-07-29 MED ORDER — MISOPROSTOL 50MCG HALF TABLET
50.0000 ug | ORAL_TABLET | ORAL | Status: DC | PRN
  Administered 2021-07-29 – 2021-07-30 (×2): 50 ug via BUCCAL
  Filled 2021-07-29 (×2): qty 1

## 2021-07-29 MED ORDER — SODIUM CHLORIDE 0.9 % IV SOLN
5.0000 10*6.[IU] | Freq: Once | INTRAVENOUS | Status: AC
  Administered 2021-07-31: 5 10*6.[IU] via INTRAVENOUS
  Filled 2021-07-29: qty 5

## 2021-07-29 MED ORDER — SODIUM CHLORIDE 0.9 % IV SOLN: 5.0000 10*6.[IU] | Freq: Once | INTRAVENOUS | Status: DC

## 2021-07-29 MED ORDER — BENZOCAINE-MENTHOL 20-0.5 % EX AERO: 1.0000 "application " | INHALATION_SPRAY | CUTANEOUS | Status: DC | PRN

## 2021-07-29 MED ORDER — LABETALOL HCL 200 MG PO TABS
600.0000 mg | ORAL_TABLET | Freq: Three times a day (TID) | ORAL | Status: DC
  Administered 2021-07-29 – 2021-07-30 (×2): 600 mg via ORAL
  Filled 2021-07-29 (×2): qty 3

## 2021-07-29 MED ORDER — MAGNESIUM SULFATE BOLUS VIA INFUSION
4.0000 g | Freq: Once | INTRAVENOUS | Status: AC
  Administered 2021-07-29: 4 g via INTRAVENOUS
  Filled 2021-07-29: qty 1000

## 2021-07-29 MED ORDER — ONDANSETRON HCL 4 MG/2ML IJ SOLN
4.0000 mg | Freq: Four times a day (QID) | INTRAMUSCULAR | Status: DC | PRN
  Administered 2021-07-29: 4 mg via INTRAVENOUS
  Filled 2021-07-29: qty 2

## 2021-07-29 MED ORDER — LACTATED RINGERS IV SOLN: 500.0000 mL | INTRAVENOUS | Status: DC | PRN

## 2021-07-29 MED ORDER — PENICILLIN G POT IN DEXTROSE 60000 UNIT/ML IV SOLN: 3.0000 10*6.[IU] | INTRAVENOUS | Status: DC

## 2021-07-29 MED ORDER — LIDOCAINE HCL (PF) 1 % IJ SOLN: 30.0000 mL | INTRAMUSCULAR | Status: DC | PRN

## 2021-07-29 MED ORDER — LABETALOL HCL 5 MG/ML IV SOLN: 80.0000 mg | INTRAVENOUS | Status: DC | PRN

## 2021-07-29 MED ORDER — BUTALBITAL-APAP-CAFFEINE 50-325-40 MG PO TABS
2.0000 | ORAL_TABLET | Freq: Once | ORAL | Status: AC
  Administered 2021-07-29: 2 via ORAL
  Filled 2021-07-29: qty 2

## 2021-07-29 MED ORDER — HYDRALAZINE HCL 20 MG/ML IJ SOLN: 10.0000 mg | INTRAMUSCULAR | Status: DC | PRN

## 2021-07-29 MED ORDER — PENICILLIN G POT IN DEXTROSE 60000 UNIT/ML IV SOLN
3.0000 10*6.[IU] | INTRAVENOUS | Status: DC
  Administered 2021-07-31: 3 10*6.[IU] via INTRAVENOUS
  Filled 2021-07-29 (×2): qty 50

## 2021-07-29 MED ORDER — DIPHENHYDRAMINE HCL 25 MG PO CAPS: 25.0000 mg | ORAL_CAPSULE | Freq: Four times a day (QID) | ORAL | Status: DC | PRN

## 2021-07-29 MED ORDER — LACTATED RINGERS IV SOLN: INTRAVENOUS | Status: DC

## 2021-07-29 MED ORDER — LABETALOL HCL 5 MG/ML IV SOLN: 40.0000 mg | INTRAVENOUS | Status: DC | PRN

## 2021-07-29 MED ORDER — MAGNESIUM SULFATE 40 GM/1000ML IV SOLN
2.0000 g/h | INTRAVENOUS | Status: DC
  Administered 2021-07-29 – 2021-08-01 (×3): 2 g/h via INTRAVENOUS
  Filled 2021-07-29 (×3): qty 1000

## 2021-07-29 MED ORDER — ACETAMINOPHEN 325 MG PO TABS: 650.0000 mg | ORAL_TABLET | ORAL | Status: DC | PRN

## 2021-07-29 MED ORDER — TERBUTALINE SULFATE 1 MG/ML IJ SOLN: 0.2500 mg | Freq: Once | INTRAMUSCULAR | Status: DC | PRN

## 2021-07-29 MED ORDER — ZOLPIDEM TARTRATE 5 MG PO TABS
5.0000 mg | ORAL_TABLET | Freq: Every evening | ORAL | Status: DC | PRN
  Administered 2021-07-29 – 2021-07-30 (×2): 5 mg via ORAL
  Filled 2021-07-29 (×2): qty 1

## 2021-07-29 MED ORDER — NIFEDIPINE ER OSMOTIC RELEASE 30 MG PO TB24
30.0000 mg | ORAL_TABLET | Freq: Every day | ORAL | Status: DC
  Administered 2021-07-29: 30 mg via ORAL
  Filled 2021-07-29 (×2): qty 1

## 2021-07-29 MED ORDER — OXYTOCIN-SODIUM CHLORIDE 30-0.9 UT/500ML-% IV SOLN
2.5000 [IU]/h | INTRAVENOUS | Status: DC
  Filled 2021-07-29: qty 500

## 2021-07-29 MED ORDER — OXYCODONE-ACETAMINOPHEN 5-325 MG PO TABS: 1.0000 | ORAL_TABLET | ORAL | Status: DC | PRN

## 2021-07-29 MED ORDER — SOD CITRATE-CITRIC ACID 500-334 MG/5ML PO SOLN
30.0000 mL | ORAL | Status: DC | PRN
  Administered 2021-07-31: 30 mL via ORAL
  Filled 2021-07-29: qty 30

## 2021-07-29 MED ORDER — MISOPROSTOL 25 MCG QUARTER TABLET
25.0000 ug | ORAL_TABLET | ORAL | Status: DC | PRN
  Administered 2021-07-29: 25 ug via VAGINAL
  Filled 2021-07-29: qty 1

## 2021-07-29 MED ORDER — BUTORPHANOL TARTRATE 1 MG/ML IJ SOLN
2.0000 mg | INTRAMUSCULAR | Status: DC | PRN
  Administered 2021-07-29 – 2021-07-30 (×2): 2 mg via INTRAVENOUS
  Filled 2021-07-29 (×2): qty 2

## 2021-07-29 MED ORDER — MAGNESIUM SULFATE 40 GM/1000ML IV SOLN
INTRAVENOUS | Status: AC
  Filled 2021-07-29: qty 1000

## 2021-07-29 MED ORDER — ONDANSETRON HCL 4 MG PO TABS: 4.0000 mg | ORAL_TABLET | ORAL | Status: DC | PRN

## 2021-07-29 MED ORDER — ESCITALOPRAM OXALATE 10 MG PO TABS
20.0000 mg | ORAL_TABLET | Freq: Every day | ORAL | Status: DC
Start: 2021-07-29 — End: 2021-08-03
  Administered 2021-07-29 – 2021-08-03 (×5): 20 mg via ORAL
  Filled 2021-07-29: qty 1
  Filled 2021-07-29 (×2): qty 2
  Filled 2021-07-29: qty 1
  Filled 2021-07-29: qty 2
  Filled 2021-07-29: qty 1

## 2021-07-29 MED ORDER — OXYCODONE-ACETAMINOPHEN 5-325 MG PO TABS: 2.0000 | ORAL_TABLET | ORAL | Status: DC | PRN

## 2021-07-29 MED ORDER — OXYTOCIN BOLUS FROM INFUSION: 333.0000 mL | Freq: Once | INTRAVENOUS | Status: DC

## 2021-07-29 MED ORDER — IBUPROFEN 600 MG PO TABS: 600.0000 mg | ORAL_TABLET | Freq: Four times a day (QID) | ORAL | Status: DC

## 2021-07-29 MED ORDER — ONDANSETRON HCL 4 MG/2ML IJ SOLN: 4.0000 mg | INTRAMUSCULAR | Status: DC | PRN

## 2021-07-29 NOTE — Progress Notes (Signed)
Patient ID: Dominique Vega, female   DOB: 12/21/1990, 31 y.o.   MRN: [id number] ? ?S/p cytotec x 1 dose; feels like H/A is much more mild and almost resolved after Fioricet #2- now is only mid-frontal as opposed to 'all over'; mag sulfate infusing ? ?BPs 152/91, 131/76, 147/93 ?FHR 130s, +accels, no decels ?Ctx irreg, mild ?Cx FT/50%/vtx -2 ? ?IUP@34 .4wks ?Severe pre-e ?SGA ?Cx unfavorable ? ?Plan to continue cytotec until able to place cervical foley ?Repeat labs at 0400 ? ?Arabella Merles CNM ? ? ? ?

## 2021-07-29 NOTE — Procedures (Signed)
Dominique Vega ?Jul 11, 1990 ?[redacted]w[redacted]d ? ?Fetus A Non-Stress Test Interpretation for 07/29/21 ? ?Indication:  gestational hypertension ? ?Fetal Heart Rate A ?Mode: External ?Baseline Rate (A): 135 bpm ?Variability: Moderate ?Accelerations: 10 x 10 ?Decelerations: None ?Multiple birth?: No ? ?Uterine Activity ?Mode: Toco ?Contraction Frequency (min): 5-7 ?Contraction Duration (sec): 50-110 ?Contraction Quality: Mild (pt rates u/c's a "3") ?Resting Tone Palpated: Relaxed ? ?Interpretation (Fetal Testing) ?Nonstress Test Interpretation: Non-reactive ?Overall Impression: Reassuring for gestational age ?Comments: tracing reviewed by Dr. Parke Poisson ? ? ?

## 2021-07-29 NOTE — Progress Notes (Signed)
? ?PRENATAL VISIT NOTE ? ?Subjective:  ?Dominique Vega is a 31 y.o. G4P0030 at [redacted]w[redacted]d being seen today for ongoing prenatal care.  She is currently monitored for the following issues for this high-risk pregnancy and has Recurrent UTI (urinary tract infection); Supervision of high risk pregnancy, antepartum; Obesity affecting pregnancy, antepartum; Abdominal pain during pregnancy, second trimester; Gestational hypertension w/o significant proteinuria in 3rd trimester; and Fetal growth restriction antepartum on their problem list. ? ?Patient reports headache.  Contractions: Irritability. Vag. Bleeding: None.  Movement: Present. Denies leaking of fluid.  ? ?The following portions of the patient's history were reviewed and updated as appropriate: allergies, current medications, past family history, past medical history, past social history, past surgical history and problem list.  ? ?Objective:  ? ?Vitals:  ? 07/29/21 1539  ?BP: 133/87  ?Pulse: 98  ?Weight: 195 lb (88.5 kg)  ? ? ?Fetal Status:     Movement: Present    ? ?General:  Alert, oriented and cooperative. Patient is in no acute distress.  ?Skin: Skin is warm and dry. No rash noted.   ?Cardiovascular: Normal heart rate noted  ?Respiratory: Normal respiratory effort, no problems with respiration noted  ?Abdomen: Soft, gravid, appropriate for gestational age.  Pain/Pressure: Absent     ?Pelvic: Cervical exam deferred        ?Extremities: Normal range of motion.     ?Mental Status: Normal mood and affect. Normal behavior. Normal judgment and thought content.  ? ?Assessment and Plan:  ?Pregnancy: G4P0030 at [redacted]w[redacted]d ?1. Gestational hypertension w/o significant proteinuria in 3rd trimester ?Has 10 pound weight gain in last week ?Still with some elevated BPs at home on 2 different meds which have needed to be escalated every week  ?She is s/p BMZ ?Reports vaginal bleeding last pm ?New onset headache that did not improve with tylenol x 2 today ?Labs have been  normal--but given the constellation of symptoms and worsening BP will move to delivery. ? ? ? ?Labor team notified. ? ?2. Supervision of high risk pregnancy, antepartum ? ? ?3. Fetal growth restriction antepartum ?Improved to 13% today ?Normal fluid and dopplers  ? ?Preterm labor symptoms and general obstetric precautions including but not limited to vaginal bleeding, contractions, leaking of fluid and fetal movement were reviewed in detail with the patient. ?Please refer to After Visit Summary for other counseling recommendations.  ? ?No follow-ups on file. ? ?Future Appointments  ?Date Time Provider [place]  ?07/31/2021  9:30 AM [facility] NURSE [facility]  ?07/31/2021  9:45 AM [facility] NST [facility]  ?08/04/2021  3:30 PM [facility] NURSE [facility]  ?08/04/2021  3:45 PM [facility] US7 [facility]  ?08/06/2021 10:30 AM [facility] NURSE [facility]  ?08/06/2021 10:45 AM [facility] NST [facility]  ?08/12/2021  9:15 AM [facility] NURSE [facility]  ?08/12/2021  9:30 AM [facility] US3 [facility]  ?08/14/2021 10:30 AM [facility] NURSE [facility]  ?08/14/2021 10:45 AM [facility] NST [facility]  ?08/18/2021  9:30 AM [facility] NURSE [facility]  ?08/18/2021  9:45 AM [facility] US6 [facility]  ?08/21/2021 10:30 AM [facility] NURSE [facility]  ?08/21/2021 10:45 AM [facility] NST [facility]  ?08/25/2021 10:15 AM [facility] NURSE [facility]  ?08/25/2021 10:30 AM [facility] US3 [facility]  ?08/28/2021 10:30 AM [facility] NURSE [facility]  ?08/28/2021 10:45 AM [facility] NST [facility]  ?09/01/2021 10:45 AM [facility] NURSE [facility]  ?09/01/2021 11:00 AM [facility] US1 [facility] [place]  ?09/04/2021 10:30 AM [facility] NURSE [facility]  ?09/04/2021 10:45 AM  [facility] NST [facility]  ? ? ?Donnamae Jude, MD ? ? ?

## 2021-07-29 NOTE — H&P (Addendum)
OBSTETRIC ADMISSION HISTORY AND PHYSICAL ? ?Dominique Vega is a 31 y.o. female G4P0030 with IUP at [redacted]w[redacted]d by LMP presenting for IOL d/t PreE with SF. She reports headache and intermittent floaters/black spots in her vision since this morning. Did not resolve with tylenol outpatient or here. She has been followed closely for gestational HTN since dx at 31wks and was on 600 mg TID of labetalol and 30 mg procardia with some severe range BP outpatient. Has +FMs, No LOF, no VB, peripheral edema, and no RUQ pain.  She plans on breast feeding. She requests natural family planning for birth control. ?She received her prenatal care at  [facility]   ? ?Dating: By LMP --->  Estimated Date of Delivery: 09/05/21 ? ?Sono:   ? ?@[redacted]w[redacted]d , CWD, normal anatomy, cephalic presentation,  99991111, 13% EFW ? ?Prenatal History/Complications:  ?-gHTN (treated with procardia 30 daily and labetalol 600 TID-received complete course of antenatal corticosteroids) ?-previous FGR now SGA (EFW 13%) ?-Rubella NI ?-anxiety (continue home Lexapro 20mg ) ? ?Past Medical History: ?Past Medical History:  ?Diagnosis Date  ? Anxiety   ? Headache   ? History of miscarriage   ? Irritable bowel syndrome   ? Ovarian cyst   ? Pyelonephritis 01/25/2019  ? UTI (urinary tract infection)   ? ? ?Past Surgical History: ?Past Surgical History:  ?Procedure Laterality Date  ? COLONOSCOPY WITH PROPOFOL N/A 08/13/2020  ? Procedure: COLONOSCOPY WITH PROPOFOL;  Surgeon: Lin Landsman, MD;  Location: [facility] ENDOSCOPY;  Service: Gastroenterology;  Laterality: N/A;  ? ESOPHAGOGASTRODUODENOSCOPY (EGD) WITH PROPOFOL N/A 08/13/2020  ? Procedure: ESOPHAGOGASTRODUODENOSCOPY (EGD) WITH PROPOFOL;  Surgeon: Lin Landsman, MD;  Location: [facility] ENDOSCOPY;  Service: Gastroenterology;  Laterality: N/A;  ? ? ?Obstetrical History: ?OB History   ? ? Gravida  ?4  ? Para  ?   ? Term  ?   ? Preterm  ?   ? AB  ?3  ? Living  ?   ?  ? ? SAB  ?3  ? IAB  ?   ? Ectopic  ?   ? Multiple  ?   ? Live Births   ?   ?   ?  ?  ? ? ?Social History ?Social History  ? ?Socioeconomic History  ? Marital status: Married  ?  Spouse name: Harika Jeanphilippe  ? Number of children: Not on file  ? Years of education: Not on file  ? Highest education level: Not on file  ?Occupational History  ? Not on file  ?Tobacco Use  ? Smoking status: Never  ? Smokeless tobacco: Never  ?Vaping Use  ? Vaping Use: Never used  ?Substance and Sexual Activity  ? Alcohol use: Not Currently  ? Drug use: Never  ? Sexual activity: Not Currently  ?  Birth control/protection: None  ?Other Topics Concern  ? Not on file  ?Social History Narrative  ? Not on file  ? ?Social Determinants of Health  ? ?Financial Resource Strain: Not on file  ?Food Insecurity: Not on file  ?Transportation Needs: Not on file  ?Physical Activity: Not on file  ?Stress: Not on file  ?Social Connections: Not on file  ? ? ?Family History: ?Family History  ?Problem Relation Age of Onset  ? Stroke Mother   ? Hypertension Mother   ? Stroke Father   ? Diabetes Father   ? ? ?Allergies: ?Allergies  ?Allergen Reactions  ? Penicillins Diarrhea and Nausea And Vomiting  ? ? ?Pt denies allergies to latex, iodine,  or shellfish. ? ?Medications Prior to Admission  ?Medication Sig Dispense Refill Last Dose  ? escitalopram (LEXAPRO) 20 MG tablet Take 20 mg by mouth daily.     ? labetalol (NORMODYNE) 200 MG tablet Take 3 tablets (600 mg total) by mouth 3 (three) times daily. 180 tablet 1   ? meclizine (ANTIVERT) 25 MG tablet Take 1 tablet (25 mg total) by mouth 3 (three) times daily as needed for dizziness. (Patient not taking: Reported on 06/17/2021) 30 tablet 0   ? NIFEdipine (PROCARDIA XL) 30 MG 24 hr tablet Take 1 tablet (30 mg total) by mouth daily. 30 tablet 1   ? ondansetron (ZOFRAN-ODT) 4 MG disintegrating tablet Take 1 tablet (4 mg total) by mouth every 6 (six) hours as needed for nausea. (Patient not taking: Reported on 06/17/2021) 20 tablet 0   ? Prenatal Vit-Fe Fumarate-FA (PNV PRENATAL PLUS  MULTIVITAMIN) 27-1 MG TABS Take 1 tablet by mouth daily.     ? promethazine (PHENERGAN) 25 MG tablet Take 1 tablet (25 mg total) by mouth every 6 (six) hours as needed for nausea or vomiting. (Patient not taking: Reported on 06/17/2021) 30 tablet 0   ? ? ? ?Review of Systems  ? ?All systems reviewed and negative except as stated in HPI ? ?Blood pressure 130/74, pulse 97, resp. rate 20, height 5' (1.524 m), weight 88.5 kg, last menstrual period 11/29/2020, SpO2 99 %. ?General appearance: alert, cooperative, and moderate distress ?Lungs: no iWOB ?Heart: regular rate  ?Abdomen: soft, non-tender ?Extremities: no sign of DVT ?Presentation: cephalic (U/S performed today was cephalic) ?Fetal monitoringBaseline: 140 bpm, Variability: Good {> 6 bpm), Accelerations: Reactive, and Decelerations: Absent ?Uterine activityOccasional ?Dilation: Closed ?Effacement (%): Thick ?Station: -2 ?Exam by:: lee ? ? ?Prenatal labs: ?ABO, Rh: --/--/A POS (03/29 1824) ?Antibody: NEG (03/29 1824) ?Rubella: <0.90 (10/13 1508) ?RPR: Non Reactive (02/15 0836)  ?HBsAg: Negative (10/13 1508)  ?HIV: Non Reactive (02/15 0836)  ?GBS: NEGATIVE/-- (12/15 1228)  ?2 hr Glucola: Nml ?Genetic screening:  LR female [id number] ?Anatomy US: nl ? ?Prenatal Transfer Tool  ?Maternal Diabetes: No ?Genetic Screening: Normal ?Maternal Ultrasounds/Referrals: None ?Fetal Ultrasounds or other Referrals:  IUGR EFW 13% today. BPP  8/10 today. Biweekly evaluation of fetus d/t SGA/gHTN ?Maternal Substance Abuse:  No ?Significant Maternal Medications:  Labetalol 600 mg TID, Procardia 30 ?Significant Maternal Lab Results: Other: GBS unknown ? ?Results for orders placed or performed during the hospital encounter of 07/29/21 (from the past 24 hour(s))  ?Protein / creatinine ratio, urine  ? Collection Time: 07/29/21  5:56 PM  ?Result Value Ref Range  ? Creatinine, Urine 94.15 mg/dL  ? Total Protein, Urine 17 mg/dL  ? Protein Creatinine Ratio 0.18 (H) 0.00 - 0.15 mg/mg[Cre]  ?CBC  ?  Collection Time: 07/29/21  6:24 PM  ?Result Value Ref Range  ? WBC 14.2 (H) 4.0 - 10.5 K/uL  ? RBC 3.45 (L) 3.87 - 5.11 MIL/uL  ? Hemoglobin 10.6 (L) 12.0 - 15.0 g/dL  ? HCT 31.2 (L) 36.0 - 46.0 %  ? MCV 90.4 80.0 - 100.0 fL  ? MCH 30.7 26.0 - 34.0 pg  ? MCHC 34.0 30.0 - 36.0 g/dL  ? RDW 13.9 11.5 - 15.5 %  ? Platelets 363 150 - 400 K/uL  ? nRBC 0.0 0.0 - 0.2 %  ?Comprehensive metabolic panel  ? Collection Time: 07/29/21  6:24 PM  ?Result Value Ref Range  ? Sodium 135 135 - 145 mmol/L  ? Potassium 3.8 3.5 - 5.1 mmol/L  ?  Chloride 109 98 - 111 mmol/L  ? CO2 19 (L) 22 - 32 mmol/L  ? Glucose, Bld 93 70 - 99 mg/dL  ? BUN 8 6 - 20 mg/dL  ? Creatinine, Ser 0.50 0.44 - 1.00 mg/dL  ? Calcium 9.0 8.9 - 10.3 mg/dL  ? Total Protein 5.7 (L) 6.5 - 8.1 g/dL  ? Albumin 2.6 (L) 3.5 - 5.0 g/dL  ? AST 22 15 - 41 U/L  ? ALT 14 0 - 44 U/L  ? Alkaline Phosphatase 87 38 - 126 U/L  ? Total Bilirubin 0.5 0.3 - 1.2 mg/dL  ? GFR, Estimated >60 >60 mL/min  ? Anion gap 7 5 - 15  ?Type and screen Kermit  ? Collection Time: 07/29/21  6:24 PM  ?Result Value Ref Range  ? ABO/RH(D) A POS   ? Antibody Screen NEG   ? Sample Expiration    ?  08/01/2021,2359 ?Performed at [facility], [place] [address], [place], [place] [address] ?  ? ? ?Patient Active Problem List  ? Diagnosis Date Noted  ? Preeclampsia, severe 07/29/2021  ? Fetal growth restriction antepartum 07/07/2021  ? Gestational hypertension w/o significant proteinuria in 3rd trimester 07/01/2021  ? Abdominal pain during pregnancy, second trimester 04/16/2021  ? Supervision of high risk pregnancy, antepartum 01/29/2021  ? Obesity affecting pregnancy, antepartum 01/29/2021  ? Recurrent UTI (urinary tract infection) 01/26/2019  ? ? ?Assessment/Plan:  ?Kaitlyne Lacrosse is a 31 y.o. G4P0030 at [redacted]w[redacted]d here for IOL d/t severe preeclampsia ? ?#Labor: IOL starting with cytotec. Plan for SVE in 4 hours and consider FB when cervix open  ?#Pain: Plan for epidural ?#FWB: Cat  I ?#ID:  GBS Unk (Preterm)>Penicillin ?#MOF: Breast ?#MOC: Natural family planning ?#Circ:  N/A ? ?#Severe PreE ?P/C today 0.18, CBC CMP nml today. Patient continues to have severe headache with black spots/flashes

## 2021-07-29 NOTE — Progress Notes (Signed)
had a dull HA this am took tylenol this at 7am, and then took a nap this afternoon and woke up with worse HA, and re took tylenol at 1pm  ? ?Feeling more puffy and swollen  ?

## 2021-07-29 NOTE — Progress Notes (Signed)
Encouraged pt to notify OB DR with any worsening symptoms like:  headache, blurred vision, epigastric pain, including RUQ , swelling in face ,hands or feet, or vaginal bleeding. Pt reports increased swelling in her face and mild, dull, intermittent RUQ pain. She has +1 pitting edema in her feet/ankles and +3 reflexes today. Reports good fetal movement. Has an OB appt today. ?

## 2021-07-30 ENCOUNTER — Encounter (HOSPITAL_COMMUNITY): Payer: Self-pay | Admitting: Obstetrics & Gynecology

## 2021-07-30 ENCOUNTER — Other Ambulatory Visit: Payer: Self-pay | Admitting: *Deleted

## 2021-07-30 DIAGNOSIS — O133 Gestational [pregnancy-induced] hypertension without significant proteinuria, third trimester: Secondary | ICD-10-CM

## 2021-07-30 DIAGNOSIS — O365931 Maternal care for other known or suspected poor fetal growth, third trimester, fetus 1: Secondary | ICD-10-CM

## 2021-07-30 LAB — CBC
HCT: 31.1 % — ABNORMAL LOW (ref 36.0–46.0)
HCT: 30.3 % — ABNORMAL LOW (ref 36.0–46.0)
HCT: 30.9 % — ABNORMAL LOW (ref 36.0–46.0)
Hemoglobin: 10.3 g/dL — ABNORMAL LOW (ref 12.0–15.0)
Hemoglobin: 10.2 g/dL — ABNORMAL LOW (ref 12.0–15.0)
Hemoglobin: 10.4 g/dL — ABNORMAL LOW (ref 12.0–15.0)
MCH: 30.3 pg (ref 26.0–34.0)
MCH: 30.7 pg (ref 26.0–34.0)
MCH: 30.6 pg (ref 26.0–34.0)
MCHC: 33.1 g/dL (ref 30.0–36.0)
MCHC: 33.7 g/dL (ref 30.0–36.0)
MCHC: 33.7 g/dL (ref 30.0–36.0)
MCV: 91.5 fL (ref 80.0–100.0)
MCV: 91.3 fL (ref 80.0–100.0)
MCV: 90.9 fL (ref 80.0–100.0)
Platelets: 348 10*3/uL (ref 150–400)
Platelets: 327 10*3/uL (ref 150–400)
Platelets: 346 10*3/uL (ref 150–400)
RBC: 3.4 MIL/uL — ABNORMAL LOW (ref 3.87–5.11)
RBC: 3.32 MIL/uL — ABNORMAL LOW (ref 3.87–5.11)
RBC: 3.4 MIL/uL — ABNORMAL LOW (ref 3.87–5.11)
RDW: 13.9 % (ref 11.5–15.5)
RDW: 14 % (ref 11.5–15.5)
RDW: 14.3 % (ref 11.5–15.5)
WBC: 15.2 10*3/uL — ABNORMAL HIGH (ref 4.0–10.5)
WBC: 15.1 10*3/uL — ABNORMAL HIGH (ref 4.0–10.5)
WBC: 18.2 10*3/uL — ABNORMAL HIGH (ref 4.0–10.5)
nRBC: 0 % (ref 0.0–0.2)
nRBC: 0 % (ref 0.0–0.2)
nRBC: 0 % (ref 0.0–0.2)

## 2021-07-30 LAB — COMPREHENSIVE METABOLIC PANEL
ALT: 15 U/L (ref 0–44)
ALT: 14 U/L (ref 0–44)
ALT: 12 U/L (ref 0–44)
AST: 21 U/L (ref 15–41)
AST: 18 U/L (ref 15–41)
AST: 17 U/L (ref 15–41)
Albumin: 2.7 g/dL — ABNORMAL LOW (ref 3.5–5.0)
Albumin: 2.6 g/dL — ABNORMAL LOW (ref 3.5–5.0)
Albumin: 2.7 g/dL — ABNORMAL LOW (ref 3.5–5.0)
Alkaline Phosphatase: 93 U/L (ref 38–126)
Alkaline Phosphatase: 92 U/L (ref 38–126)
Alkaline Phosphatase: 103 U/L (ref 38–126)
Anion gap: 8 (ref 5–15)
Anion gap: 6 (ref 5–15)
Anion gap: 8 (ref 5–15)
BUN: 7 mg/dL (ref 6–20)
BUN: 8 mg/dL (ref 6–20)
BUN: 9 mg/dL (ref 6–20)
CO2: 21 mmol/L — ABNORMAL LOW (ref 22–32)
CO2: 21 mmol/L — ABNORMAL LOW (ref 22–32)
CO2: 21 mmol/L — ABNORMAL LOW (ref 22–32)
Calcium: 8.1 mg/dL — ABNORMAL LOW (ref 8.9–10.3)
Calcium: 7.5 mg/dL — ABNORMAL LOW (ref 8.9–10.3)
Calcium: 7.6 mg/dL — ABNORMAL LOW (ref 8.9–10.3)
Chloride: 106 mmol/L (ref 98–111)
Chloride: 107 mmol/L (ref 98–111)
Chloride: 108 mmol/L (ref 98–111)
Creatinine, Ser: 0.51 mg/dL (ref 0.44–1.00)
Creatinine, Ser: 0.54 mg/dL (ref 0.44–1.00)
Creatinine, Ser: 0.67 mg/dL (ref 0.44–1.00)
GFR, Estimated: >60 mL/min (ref >60)
GFR, Estimated: >60 mL/min (ref >60)
GFR, Estimated: >60 mL/min (ref >60)
Glucose, Bld: 114 mg/dL — ABNORMAL HIGH (ref 70–99)
Glucose, Bld: 85 mg/dL (ref 70–99)
Glucose, Bld: 111 mg/dL — ABNORMAL HIGH (ref 70–99)
Potassium: 4.1 mmol/L (ref 3.5–5.1)
Potassium: 4.2 mmol/L (ref 3.5–5.1)
Potassium: 4.1 mmol/L (ref 3.5–5.1)
Sodium: 135 mmol/L (ref 135–145)
Sodium: 134 mmol/L — ABNORMAL LOW (ref 135–145)
Sodium: 137 mmol/L (ref 135–145)
Total Bilirubin: 0.2 mg/dL — ABNORMAL LOW (ref 0.3–1.2)
Total Bilirubin: 0.4 mg/dL (ref 0.3–1.2)
Total Bilirubin: <0.1 mg/dL — ABNORMAL LOW (ref 0.3–1.2)
Total Protein: 5.9 g/dL — ABNORMAL LOW (ref 6.5–8.1)
Total Protein: 5.6 g/dL — ABNORMAL LOW (ref 6.5–8.1)
Total Protein: 5.6 g/dL — ABNORMAL LOW (ref 6.5–8.1)

## 2021-07-30 LAB — MAGNESIUM
Magnesium: 4.4 mg/dL — ABNORMAL HIGH (ref 1.7–2.4)
Magnesium: 4.7 mg/dL — ABNORMAL HIGH (ref 1.7–2.4)
Magnesium: 4.8 mg/dL — ABNORMAL HIGH (ref 1.7–2.4)

## 2021-07-30 LAB — RPR: RPR Ser Ql: NONREACTIVE

## 2021-07-30 MED ORDER — PHENYLEPHRINE 40 MCG/ML (10ML) SYRINGE FOR IV PUSH (FOR BLOOD PRESSURE SUPPORT): 80.0000 ug | PREFILLED_SYRINGE | INTRAVENOUS | Status: DC | PRN

## 2021-07-30 MED ORDER — FENTANYL-BUPIVACAINE-NACL 0.5-0.125-0.9 MG/250ML-% EP SOLN
12.0000 mL/h | EPIDURAL | Status: DC | PRN
  Administered 2021-07-31: 12 mL/h via EPIDURAL
  Filled 2021-07-30: qty 250

## 2021-07-30 MED ORDER — DIPHENHYDRAMINE HCL 50 MG/ML IJ SOLN: 12.5000 mg | INTRAMUSCULAR | Status: DC | PRN

## 2021-07-30 MED ORDER — EPHEDRINE 5 MG/ML INJ: 10.0000 mg | INTRAVENOUS | Status: DC | PRN

## 2021-07-30 MED ORDER — LACTATED RINGERS IV SOLN
500.0000 mL | Freq: Once | INTRAVENOUS | Status: AC
  Administered 2021-07-31: 500 mL via INTRAVENOUS

## 2021-07-30 MED ORDER — MISOPROSTOL 25 MCG QUARTER TABLET
25.0000 ug | ORAL_TABLET | ORAL | Status: DC | PRN
  Administered 2021-07-30: 25 ug via VAGINAL
  Filled 2021-07-30: qty 1

## 2021-07-30 MED ORDER — LABETALOL HCL 200 MG PO TABS
300.0000 mg | ORAL_TABLET | Freq: Three times a day (TID) | ORAL | Status: DC
  Administered 2021-07-30: 300 mg via ORAL
  Filled 2021-07-30: qty 1

## 2021-07-30 MED ORDER — BUTALBITAL-APAP-CAFFEINE 50-325-40 MG PO TABS
2.0000 | ORAL_TABLET | Freq: Four times a day (QID) | ORAL | Status: DC | PRN
  Administered 2021-07-30: 2 via ORAL
  Filled 2021-07-30: qty 2

## 2021-07-30 MED ORDER — OXYTOCIN-SODIUM CHLORIDE 30-0.9 UT/500ML-% IV SOLN
1.0000 m[IU]/min | INTRAVENOUS | Status: DC
  Administered 2021-07-30: 2 m[IU]/min via INTRAVENOUS

## 2021-07-30 MED ORDER — MISOPROSTOL 50MCG HALF TABLET
50.0000 ug | ORAL_TABLET | Freq: Once | ORAL | Status: AC
  Administered 2021-07-30: 50 ug via BUCCAL
  Filled 2021-07-30: qty 1

## 2021-07-30 MED ORDER — TERBUTALINE SULFATE 1 MG/ML IJ SOLN
0.2500 mg | Freq: Once | INTRAMUSCULAR | Status: AC | PRN
  Administered 2021-07-31: 0.25 mg via SUBCUTANEOUS
  Filled 2021-07-30: qty 1

## 2021-07-30 NOTE — Progress Notes (Signed)
Pt pleasant and doing well with FOB present at bs for support. Reports mild sensation of discomfort with irregular CTXs. Denies abnormal s/s, no HA at this time. FHTs cat 1, no accels or decels present, baseline moderate at 130. Continue current management, recheck PRN. ?

## 2021-07-30 NOTE — Progress Notes (Signed)
Patient ID: Dominique Vega, female   DOB: [birth year], 31 y.o.   MRN: [id number] ? ?S/p cytotec x 4th dose, which was just placed; reports 1-2 pain with H/A after dose of Fioricet; mag infusing ? ?BPs 103/60, 111/55, 135/89 ?FHR 120s, +accels, occ variables ?Ctx irreg, mild 3-8 mins ?Cx was 1+/50-60/vtx -2 ? ?IUP@34 .5wks ?Severe pre-e ?SGA ?Cx becoming more favorable ? ?Will plan to premedicate for cervical foley placement w next exam- pt agreeable ?Held morning dose of Labetalol 600mg  due to nl BP values ? ? CNM ?07/30/2021 ? ? ? ?

## 2021-07-30 NOTE — Progress Notes (Signed)
Labor Progress Note ?Dominique Vega is a 31 y.o. G4P0030 at [redacted]w[redacted]d who presented for IOL due to pre-eclampsia with severe features.  ? ?S: Doing well. No concerns.  ? ?O:  ?BP (!) 143/89   Pulse 93   Temp 98 ?F (36.7 ?C) (Oral)   Resp 16   Ht 5' (1.524 m)   Wt 88.5 kg   LMP 11/29/2020   SpO2 98%   BMI 38.08 kg/m?  ? ?EFM: Baseline 125 bpm, moderate variability, + accels, no decels  ? ?CVE: Dilation: 3.5 ?Effacement (%): 80 ?Cervical Position: Middle ?Station: -2 ?Presentation: Vertex ?Exam by:: lee ? ?A&P: 31 y.o. G4P0030 [redacted]w[redacted]d  ? ?#Labor: Progressing well s/p foley balloon and Cytotec. Will start Pitocin 2x2 and reassess in 4 hours. Consider AROM on next check.  ?#Pain: PRN ?#FWB: Cat 1  ?#GBS negative ? ?#Severe pre-eclampsia: BP mild range. Remains on Mag. Will add back home Labetalol at reduced dose of 300 mg TID. Will continue to monitor closely. Will follow up 8 PM labs.  ? ?Genia Del, MD ?7:42 PM ? ?

## 2021-07-30 NOTE — Progress Notes (Signed)
Labor Progress Note ?Dominique Vega is a 31 y.o. G4P0030 at [redacted]w[redacted]d who presented for IOL due to pre-eclampsia with severe features. ?  ?S: Doing well. Continues to have mild cramping. Headache is nearly completely gone. No other concerns at this time.  ? ?O:  ?BP 115/69   Pulse 81   Resp 16   Ht 5' (1.524 m)   Wt 88.5 kg   LMP 11/29/2020   SpO2 98%   BMI 38.08 kg/m?  ? ?EFM: Baseline 125 bpm, moderate variability, + accels, no decels  ? ?CVE: Dilation: 1.5 ?Effacement (%): 50, 60 ?Cervical Position: Middle ?Station: -3 ?Presentation: Vertex ?Exam by:: Mathis Fare ? ?A&P: 31 y.o. G4P0030 [redacted]w[redacted]d  ? ?#Labor: Progressing. Foley balloon placed this check without difficulty. Will continue with Cytotec while balloon remains in place. Plan to reassess in 4 hours.  ?#Pain: PRN; IV Stadol for now  ?#FWB: Cat 1  ?#GBS: PCN due to prematurity ? ?#Pre-eclampsia with severe features: BP remains normal to mild range. Headache resolving. Morning Labetalol held due to low BP. Will continue to hold home Procardia and Labetalol while BP is low-normal. 12 PM labs stable with normal creatinine, LFTs, platelets, and Mg level of 4.7. Will continue to monitor and repeat labs at 8 PM.  ? ?Worthy Rancher, MD ?1:02 PM ? ?

## 2021-07-31 ENCOUNTER — Ambulatory Visit: Payer: BC Managed Care – PPO

## 2021-07-31 ENCOUNTER — Inpatient Hospital Stay (HOSPITAL_COMMUNITY): Payer: BC Managed Care – PPO | Admitting: Anesthesiology

## 2021-07-31 ENCOUNTER — Encounter (HOSPITAL_COMMUNITY)
Admission: AD | Disposition: A | Discharge status: Home / Self Care | Payer: Self-pay | Source: Home / Self Care | Attending: Obstetrics and Gynecology

## 2021-07-31 ENCOUNTER — Other Ambulatory Visit: Payer: Self-pay

## 2021-07-31 ENCOUNTER — Encounter (HOSPITAL_COMMUNITY): Payer: Self-pay | Admitting: Obstetrics & Gynecology

## 2021-07-31 DIAGNOSIS — Z3A34 34 weeks gestation of pregnancy: Secondary | ICD-10-CM | POA: Diagnosis not present

## 2021-07-31 DIAGNOSIS — O1414 Severe pre-eclampsia complicating childbirth: Secondary | ICD-10-CM | POA: Diagnosis not present

## 2021-07-31 LAB — COMPREHENSIVE METABOLIC PANEL
ALT: 11 U/L (ref 0–44)
AST: 17 U/L (ref 15–41)
Albumin: 2.7 g/dL — ABNORMAL LOW (ref 3.5–5.0)
Alkaline Phosphatase: 86 U/L (ref 38–126)
Anion gap: 10 (ref 5–15)
BUN: 7 mg/dL (ref 6–20)
CO2: 19 mmol/L — ABNORMAL LOW (ref 22–32)
Calcium: 7 mg/dL — ABNORMAL LOW (ref 8.9–10.3)
Chloride: 106 mmol/L (ref 98–111)
Creatinine, Ser: 0.63 mg/dL (ref 0.44–1.00)
GFR, Estimated: >60 mL/min (ref >60)
Glucose, Bld: 105 mg/dL — ABNORMAL HIGH (ref 70–99)
Potassium: 4.6 mmol/L (ref 3.5–5.1)
Sodium: 135 mmol/L (ref 135–145)
Total Bilirubin: 0.4 mg/dL (ref 0.3–1.2)
Total Protein: 5.3 g/dL — ABNORMAL LOW (ref 6.5–8.1)

## 2021-07-31 LAB — CBC WITH DIFFERENTIAL/PLATELET
Abs Immature Granulocytes: 0.22 10*3/uL — ABNORMAL HIGH (ref 0.00–0.07)
Basophils Absolute: 0.1 10*3/uL (ref 0.0–0.1)
Basophils Relative: 0 %
Eosinophils Absolute: 0.2 10*3/uL (ref 0.0–0.5)
Eosinophils Relative: 1 %
HCT: 30.7 % — ABNORMAL LOW (ref 36.0–46.0)
Hemoglobin: 10.4 g/dL — ABNORMAL LOW (ref 12.0–15.0)
Immature Granulocytes: 1 %
Lymphocytes Relative: 7 %
Lymphs Abs: 1.9 10*3/uL (ref 0.7–4.0)
MCH: 31.2 pg (ref 26.0–34.0)
MCHC: 33.9 g/dL (ref 30.0–36.0)
MCV: 92.2 fL (ref 80.0–100.0)
Monocytes Absolute: 1.3 10*3/uL — ABNORMAL HIGH (ref 0.1–1.0)
Monocytes Relative: 5 %
Neutro Abs: 23.5 10*3/uL — ABNORMAL HIGH (ref 1.7–7.7)
Neutrophils Relative %: 86 %
Platelets: 266 10*3/uL (ref 150–400)
RBC: 3.33 MIL/uL — ABNORMAL LOW (ref 3.87–5.11)
RDW: 14.3 % (ref 11.5–15.5)
WBC: 27.2 10*3/uL — ABNORMAL HIGH (ref 4.0–10.5)
nRBC: 0 % (ref 0.0–0.2)

## 2021-07-31 SURGERY — Surgical Case
Anesthesia: Epidural

## 2021-07-31 MED ORDER — MEPERIDINE HCL 25 MG/ML IJ SOLN: 6.2500 mg | INTRAMUSCULAR | Status: DC | PRN

## 2021-07-31 MED ORDER — DIBUCAINE (PERIANAL) 1 % EX OINT: 1.0000 "application " | TOPICAL_OINTMENT | CUTANEOUS | Status: DC | PRN

## 2021-07-31 MED ORDER — MEDROXYPROGESTERONE ACETATE 150 MG/ML IM SUSP: 150.0000 mg | INTRAMUSCULAR | Status: DC | PRN

## 2021-07-31 MED ORDER — METOCLOPRAMIDE HCL 5 MG/ML IJ SOLN
INTRAMUSCULAR | Status: AC
  Filled 2021-07-31: qty 2

## 2021-07-31 MED ORDER — LIDOCAINE-EPINEPHRINE (PF) 2 %-1:200000 IJ SOLN
INTRAMUSCULAR | Status: DC | PRN
  Administered 2021-07-31: 10 mL via EPIDURAL

## 2021-07-31 MED ORDER — KETOROLAC TROMETHAMINE 30 MG/ML IJ SOLN
INTRAMUSCULAR | Status: AC
  Filled 2021-07-31: qty 1

## 2021-07-31 MED ORDER — PROPOFOL 10 MG/ML IV BOLUS
INTRAVENOUS | Status: DC | PRN
  Administered 2021-07-31: 150 mg via INTRAVENOUS

## 2021-07-31 MED ORDER — MEASLES, MUMPS & RUBELLA VAC IJ SOLR
0.5000 mL | Freq: Once | INTRAMUSCULAR | Status: AC
  Administered 2021-08-01: 0.5 mL via SUBCUTANEOUS
  Filled 2021-07-31: qty 0.5

## 2021-07-31 MED ORDER — SENNOSIDES-DOCUSATE SODIUM 8.6-50 MG PO TABS
2.0000 | ORAL_TABLET | Freq: Every day | ORAL | Status: DC
  Administered 2021-08-01 – 2021-08-03 (×3): 2 via ORAL
  Filled 2021-07-31 (×3): qty 2

## 2021-07-31 MED ORDER — FENTANYL CITRATE (PF) 100 MCG/2ML IJ SOLN
INTRAMUSCULAR | Status: AC
  Filled 2021-07-31: qty 2

## 2021-07-31 MED ORDER — WITCH HAZEL-GLYCERIN EX PADS: 1.0000 "application " | MEDICATED_PAD | CUTANEOUS | Status: DC | PRN

## 2021-07-31 MED ORDER — OXYCODONE HCL 5 MG PO TABS
5.0000 mg | ORAL_TABLET | ORAL | Status: DC | PRN
  Administered 2021-07-31: 5 mg via ORAL
  Filled 2021-07-31: qty 1

## 2021-07-31 MED ORDER — DEXAMETHASONE SODIUM PHOSPHATE 4 MG/ML IJ SOLN
INTRAMUSCULAR | Status: DC | PRN
  Administered 2021-07-31: 4 mg via INTRAVENOUS

## 2021-07-31 MED ORDER — SODIUM CHLORIDE 0.9% FLUSH
3.0000 mL | INTRAVENOUS | Status: DC | PRN
  Administered 2021-08-01: 3 mL via INTRAVENOUS

## 2021-07-31 MED ORDER — SUCCINYLCHOLINE CHLORIDE 200 MG/10ML IV SOSY
PREFILLED_SYRINGE | INTRAVENOUS | Status: AC
  Filled 2021-07-31: qty 10

## 2021-07-31 MED ORDER — NALOXONE HCL 4 MG/10ML IJ SOLN
1.0000 ug/kg/h | INTRAVENOUS | Status: DC | PRN
  Filled 2021-07-31: qty 5

## 2021-07-31 MED ORDER — FENTANYL CITRATE (PF) 100 MCG/2ML IJ SOLN
INTRAMUSCULAR | Status: DC | PRN
Start: 2021-07-31 — End: 2021-07-31
  Administered 2021-07-31: 100 ug via EPIDURAL

## 2021-07-31 MED ORDER — KETOROLAC TROMETHAMINE 30 MG/ML IJ SOLN
30.0000 mg | Freq: Four times a day (QID) | INTRAMUSCULAR | Status: AC
  Administered 2021-07-31 – 2021-08-01 (×4): 30 mg via INTRAVENOUS
  Filled 2021-07-31 (×3): qty 1

## 2021-07-31 MED ORDER — DIPHENHYDRAMINE HCL 25 MG PO CAPS: 25.0000 mg | ORAL_CAPSULE | Freq: Four times a day (QID) | ORAL | Status: DC | PRN

## 2021-07-31 MED ORDER — OXYTOCIN-SODIUM CHLORIDE 30-0.9 UT/500ML-% IV SOLN
INTRAVENOUS | Status: DC | PRN
Start: 2021-07-31 — End: 2021-07-31
  Administered 2021-07-31 (×2): 500 mL via INTRAVENOUS

## 2021-07-31 MED ORDER — NIFEDIPINE ER OSMOTIC RELEASE 60 MG PO TB24
60.0000 mg | ORAL_TABLET | Freq: Every day | ORAL | Status: DC
  Administered 2021-08-01 – 2021-08-03 (×3): 60 mg via ORAL
  Filled 2021-07-31 (×3): qty 1

## 2021-07-31 MED ORDER — ALBUMIN HUMAN 5 % IV SOLN: INTRAVENOUS | Status: DC | PRN

## 2021-07-31 MED ORDER — FENTANYL CITRATE (PF) 100 MCG/2ML IJ SOLN
INTRAMUSCULAR | Status: AC
Start: 2021-07-31 — End: ?
  Filled 2021-07-31: qty 2

## 2021-07-31 MED ORDER — CEFAZOLIN SODIUM-DEXTROSE 2-3 GM-%(50ML) IV SOLR
INTRAVENOUS | Status: DC | PRN
  Administered 2021-07-31: 2 g via INTRAVENOUS

## 2021-07-31 MED ORDER — DIPHENHYDRAMINE HCL 25 MG PO CAPS
25.0000 mg | ORAL_CAPSULE | ORAL | Status: DC | PRN
  Administered 2021-08-01: 25 mg via ORAL
  Filled 2021-07-31: qty 1

## 2021-07-31 MED ORDER — LACTATED RINGERS IV SOLN: INTRAVENOUS | Status: DC

## 2021-07-31 MED ORDER — METOCLOPRAMIDE HCL 5 MG/ML IJ SOLN
INTRAMUSCULAR | Status: DC | PRN
Start: 2021-07-31 — End: 2021-07-31
  Administered 2021-07-31: 10 mg via INTRAVENOUS

## 2021-07-31 MED ORDER — SCOPOLAMINE 1 MG/3DAYS TD PT72
1.0000 | MEDICATED_PATCH | Freq: Once | TRANSDERMAL | Status: AC
  Administered 2021-07-31: 1.5 mg via TRANSDERMAL

## 2021-07-31 MED ORDER — TETANUS-DIPHTH-ACELL PERTUSSIS 5-2.5-18.5 LF-MCG/0.5 IM SUSY: 0.5000 mL | PREFILLED_SYRINGE | Freq: Once | INTRAMUSCULAR | Status: DC

## 2021-07-31 MED ORDER — DIPHENHYDRAMINE HCL 50 MG/ML IJ SOLN
12.5000 mg | INTRAMUSCULAR | Status: DC | PRN
  Administered 2021-07-31 – 2021-08-01 (×3): 12.5 mg via INTRAVENOUS
  Filled 2021-07-31 (×3): qty 1

## 2021-07-31 MED ORDER — IBUPROFEN 600 MG PO TABS
600.0000 mg | ORAL_TABLET | Freq: Four times a day (QID) | ORAL | Status: DC
  Administered 2021-08-01 – 2021-08-03 (×8): 600 mg via ORAL
  Filled 2021-07-31 (×8): qty 1

## 2021-07-31 MED ORDER — OXYCODONE HCL 5 MG PO TABS: 5.0000 mg | ORAL_TABLET | ORAL | Status: DC | PRN

## 2021-07-31 MED ORDER — NIFEDIPINE ER OSMOTIC RELEASE 30 MG PO TB24: 30.0000 mg | ORAL_TABLET | Freq: Every day | ORAL | Status: DC

## 2021-07-31 MED ORDER — METHYLERGONOVINE MALEATE 0.2 MG/ML IJ SOLN
INTRAMUSCULAR | Status: AC
  Filled 2021-07-31: qty 1

## 2021-07-31 MED ORDER — PHENYLEPHRINE HCL-NACL 20-0.9 MG/250ML-% IV SOLN
INTRAVENOUS | Status: DC | PRN
  Administered 2021-07-31: 60 ug/min via INTRAVENOUS

## 2021-07-31 MED ORDER — SIMETHICONE 80 MG PO CHEW: 80.0000 mg | CHEWABLE_TABLET | ORAL | Status: DC | PRN

## 2021-07-31 MED ORDER — SIMETHICONE 80 MG PO CHEW
80.0000 mg | CHEWABLE_TABLET | Freq: Three times a day (TID) | ORAL | Status: DC
  Administered 2021-07-31 – 2021-08-03 (×9): 80 mg via ORAL
  Filled 2021-07-31 (×10): qty 1

## 2021-07-31 MED ORDER — OXYCODONE HCL 5 MG PO TABS
10.0000 mg | ORAL_TABLET | ORAL | Status: DC | PRN
  Administered 2021-08-01 – 2021-08-03 (×12): 10 mg via ORAL
  Filled 2021-07-31 (×12): qty 2

## 2021-07-31 MED ORDER — ENOXAPARIN SODIUM 40 MG/0.4ML IJ SOSY
40.0000 mg | PREFILLED_SYRINGE | INTRAMUSCULAR | Status: DC
  Administered 2021-08-01 – 2021-08-03 (×3): 40 mg via SUBCUTANEOUS
  Filled 2021-07-31 (×3): qty 0.4

## 2021-07-31 MED ORDER — SCOPOLAMINE 1 MG/3DAYS TD PT72
MEDICATED_PATCH | TRANSDERMAL | Status: AC
  Filled 2021-07-31: qty 1

## 2021-07-31 MED ORDER — COCONUT OIL OIL: 1.0000 "application " | TOPICAL_OIL | Status: DC | PRN

## 2021-07-31 MED ORDER — PROPOFOL 10 MG/ML IV BOLUS
INTRAVENOUS | Status: AC
  Filled 2021-07-31: qty 20

## 2021-07-31 MED ORDER — COCONUT OIL OIL
1.0000 | TOPICAL_OIL | Status: DC | PRN
Start: 2021-07-31 — End: 2021-08-03
  Administered 2021-07-31: 1 via TOPICAL

## 2021-07-31 MED ORDER — DOCUSATE SODIUM 100 MG PO CAPS
100.0000 mg | ORAL_CAPSULE | Freq: Two times a day (BID) | ORAL | Status: DC
  Administered 2021-07-31 – 2021-08-03 (×7): 100 mg via ORAL
  Filled 2021-07-31 (×8): qty 1

## 2021-07-31 MED ORDER — PHENYLEPHRINE HCL-NACL 20-0.9 MG/250ML-% IV SOLN
INTRAVENOUS | Status: AC
  Filled 2021-07-31: qty 250

## 2021-07-31 MED ORDER — AZITHROMYCIN 500 MG IV SOLR
INTRAVENOUS | Status: DC | PRN
  Administered 2021-07-31: 500 mg via INTRAVENOUS

## 2021-07-31 MED ORDER — AMISULPRIDE (ANTIEMETIC) 5 MG/2ML IV SOLN: 10.0000 mg | Freq: Once | INTRAVENOUS | Status: DC | PRN

## 2021-07-31 MED ORDER — METHYLERGONOVINE MALEATE 0.2 MG/ML IJ SOLN
INTRAMUSCULAR | Status: DC | PRN
  Administered 2021-07-31: .2 mg via INTRAMUSCULAR

## 2021-07-31 MED ORDER — PRENATAL MULTIVITAMIN CH: 1.0000 | ORAL_TABLET | Freq: Every day | ORAL | Status: DC

## 2021-07-31 MED ORDER — ONDANSETRON HCL 4 MG/2ML IJ SOLN
4.0000 mg | Freq: Three times a day (TID) | INTRAMUSCULAR | Status: DC | PRN
  Administered 2021-07-31: 4 mg via INTRAVENOUS
  Filled 2021-07-31: qty 2

## 2021-07-31 MED ORDER — OXYTOCIN-SODIUM CHLORIDE 30-0.9 UT/500ML-% IV SOLN
INTRAVENOUS | Status: AC
  Filled 2021-07-31: qty 500

## 2021-07-31 MED ORDER — PHENYLEPHRINE HCL (PRESSORS) 10 MG/ML IV SOLN
INTRAVENOUS | Status: DC | PRN
Start: 2021-07-31 — End: 2021-07-31
  Administered 2021-07-31 (×5): 120 ug via INTRAVENOUS

## 2021-07-31 MED ORDER — MENTHOL 3 MG MT LOZG: 1.0000 | LOZENGE | OROMUCOSAL | Status: DC | PRN

## 2021-07-31 MED ORDER — FENTANYL CITRATE (PF) 100 MCG/2ML IJ SOLN: 25.0000 ug | INTRAMUSCULAR | Status: DC | PRN

## 2021-07-31 MED ORDER — ONDANSETRON HCL 4 MG/2ML IJ SOLN
INTRAMUSCULAR | Status: AC
  Filled 2021-07-31: qty 2

## 2021-07-31 MED ORDER — LIDOCAINE HCL (PF) 1 % IJ SOLN
INTRAMUSCULAR | Status: DC | PRN
  Administered 2021-07-31: 8 mL via EPIDURAL

## 2021-07-31 MED ORDER — MORPHINE SULFATE (PF) 0.5 MG/ML IJ SOLN
INTRAMUSCULAR | Status: AC
  Filled 2021-07-31: qty 10

## 2021-07-31 MED ORDER — PRENATAL MULTIVITAMIN CH
1.0000 | ORAL_TABLET | Freq: Every day | ORAL | Status: DC
  Administered 2021-07-31 – 2021-08-02 (×3): 1 via ORAL
  Filled 2021-07-31 (×3): qty 1

## 2021-07-31 MED ORDER — ACETAMINOPHEN 325 MG PO TABS: 650.0000 mg | ORAL_TABLET | ORAL | Status: DC | PRN

## 2021-07-31 MED ORDER — ONDANSETRON HCL 4 MG/2ML IJ SOLN
INTRAMUSCULAR | Status: DC | PRN
  Administered 2021-07-31: 4 mg via INTRAVENOUS

## 2021-07-31 MED ORDER — OXYTOCIN-SODIUM CHLORIDE 30-0.9 UT/500ML-% IV SOLN: 2.5000 [IU]/h | INTRAVENOUS | Status: AC

## 2021-07-31 MED ORDER — LACTATED RINGERS IV SOLN: INTRAVENOUS | Status: DC | PRN

## 2021-07-31 MED ORDER — DEXAMETHASONE SODIUM PHOSPHATE 10 MG/ML IJ SOLN
INTRAMUSCULAR | Status: AC
  Filled 2021-07-31: qty 1

## 2021-07-31 MED ORDER — OXYTOCIN 10 UNIT/ML IJ SOLN
INTRAMUSCULAR | Status: AC
  Filled 2021-07-31: qty 1

## 2021-07-31 MED ORDER — NALOXONE HCL 0.4 MG/ML IJ SOLN: 0.4000 mg | INTRAMUSCULAR | Status: DC | PRN

## 2021-07-31 MED ORDER — MORPHINE SULFATE (PF) 0.5 MG/ML IJ SOLN
INTRAMUSCULAR | Status: DC | PRN
Start: 2021-07-31 — End: 2021-07-31
  Administered 2021-07-31: 3 mg via EPIDURAL

## 2021-07-31 MED ORDER — SUCCINYLCHOLINE CHLORIDE 200 MG/10ML IV SOSY
PREFILLED_SYRINGE | INTRAVENOUS | Status: DC | PRN
  Administered 2021-07-31: 120 mg via INTRAVENOUS

## 2021-07-31 SURGICAL SUPPLY — 37 items
BENZOIN TINCTURE PRP APPL 2/3 (GAUZE/BANDAGES/DRESSINGS) ×2 IMPLANT
CHLORAPREP W/TINT 26ML (MISCELLANEOUS) ×2 IMPLANT
CLAMP CORD UMBIL (MISCELLANEOUS) IMPLANT
CLOTH BEACON ORANGE TIMEOUT ST (SAFETY) ×2 IMPLANT
DERMABOND ADVANCED (GAUZE/BANDAGES/DRESSINGS) ×1
DERMABOND ADVANCED .7 DNX12 (GAUZE/BANDAGES/DRESSINGS) IMPLANT
DRSG OPSITE POSTOP 4X10 (GAUZE/BANDAGES/DRESSINGS) ×2 IMPLANT
ELECT REM PT RETURN 9FT ADLT (ELECTROSURGICAL) ×2
ELECTRODE REM PT RTRN 9FT ADLT (ELECTROSURGICAL) ×1 IMPLANT
EXTRACTOR VACUUM KIWI (MISCELLANEOUS) IMPLANT
GLOVE BIOGEL PI IND STRL 7.0 (GLOVE) ×3 IMPLANT
GLOVE BIOGEL PI INDICATOR 7.0 (GLOVE) ×3
GLOVE ECLIPSE 6.5 STRL STRAW (GLOVE) ×2 IMPLANT
GOWN STRL REUS W/TWL LRG LVL3 (GOWN DISPOSABLE) ×6 IMPLANT
HEMOSTAT ARISTA ABSORB 3G PWDR (HEMOSTASIS) ×1 IMPLANT
KIT ABG SYR 3ML LUER SLIP (SYRINGE) IMPLANT
NDL HYPO 25X5/8 SAFETYGLIDE (NEEDLE) IMPLANT
NEEDLE HYPO 25X5/8 SAFETYGLIDE (NEEDLE) IMPLANT
NS IRRIG 1000ML POUR BTL (IV SOLUTION) ×2 IMPLANT
PACK C SECTION WH (CUSTOM PROCEDURE TRAY) ×2 IMPLANT
PAD ABD 7.5X8 STRL (GAUZE/BANDAGES/DRESSINGS) ×2 IMPLANT
PAD OB MATERNITY 4.3X12.25 (PERSONAL CARE ITEMS) ×2 IMPLANT
PENCIL SMOKE EVAC W/HOLSTER (ELECTROSURGICAL) ×2 IMPLANT
RTRCTR C-SECT PINK 25CM LRG (MISCELLANEOUS) ×2 IMPLANT
STRIP CLOSURE SKIN 1/2X4 (GAUZE/BANDAGES/DRESSINGS) ×2 IMPLANT
SUT PLAIN 0 NONE (SUTURE) IMPLANT
SUT PLAIN 2 0 XLH (SUTURE) IMPLANT
SUT VIC AB 0 CT1 27 (SUTURE) ×4
SUT VIC AB 0 CT1 27XBRD ANBCTR (SUTURE) ×2 IMPLANT
SUT VIC AB 0 CTX 36 (SUTURE) ×6
SUT VIC AB 0 CTX36XBRD ANBCTRL (SUTURE) ×3 IMPLANT
SUT VIC AB 2-0 CT1 27 (SUTURE) ×2
SUT VIC AB 2-0 CT1 TAPERPNT 27 (SUTURE) ×1 IMPLANT
SUT VIC AB 4-0 KS 27 (SUTURE) ×2 IMPLANT
TOWEL OR 17X24 6PK STRL BLUE (TOWEL DISPOSABLE) ×2 IMPLANT
TRAY FOLEY W/BAG SLVR 14FR LF (SET/KITS/TRAYS/PACK) IMPLANT
WATER STERILE IRR 1000ML POUR (IV SOLUTION) ×2 IMPLANT

## 2021-07-31 NOTE — Op Note (Addendum)
Dominique Vega ?PROCEDURE DATE: 07/31/2021 ? ?PREOPERATIVE DIAGNOSIS: Intrauterine pregnancy at  10592w6d weeks gestation; non-reassuring fetal status ? ?POSTOPERATIVE DIAGNOSIS: The same ? ?PROCEDURE: STAT Primary Low Transverse Cesarean Section ? ?SURGEON:  Dr. Myna HidalgoJennifer Nikodem Vega ? ?ASSISTANT: Dr. Leticia PennaSamantha Vega ? ?INDICATIONS: Dominique Vega is a 31 y.o. G4P0030 at 1292w6d scheduled for cesarean section secondary to non-reassuring fetal status.  The risks of cesarean section discussed with the patient included but were not limited to: bleeding which may require transfusion or reoperation; infection which may require antibiotics; injury to bowel, bladder, ureters or other surrounding organs; injury to the fetus; need for additional procedures including hysterectomy in the event of a life-threatening hemorrhage; placental abnormalities wth subsequent pregnancies, incisional problems, thromboembolic phenomenon and other postoperative/anesthesia complications. Due to the emergent setting with fetal bradycadia, verbal consent was obtained.  ? ?FINDINGS:  Viable female infant in cephalic presentation.  Apgars 4 and 8, weight, 2090g.  Clear amniotic fluid.  Intact placenta, three vessel cord.  Normal uterus, fallopian tubes and ovaries bilaterally. ? ?ANESTHESIA:  Epidural, general  ?INTRAVENOUS FLUIDS: 1,000 ml ?ESTIMATED BLOOD LOSS: 518 ml ?URINE OUTPUT:  800 ml (in foley prior to surgery as well) ?SPECIMENS: Placenta sent to L&D ?COMPLICATIONS: None immediate ? ?PROCEDURE IN DETAIL:  Patient was taken to the OR STAT due to NRFHT as above. She was placed in a dorsal supine position with a leftward tilt, and prepped and draped quickly in a sterile manner.  A foley catheter was already in her bladder and attached to constant gravity, which drained clear fluid throughout.  Her epidural was found to be inadequate, thus she was placed under general anesthesia with intubation. Once adequate, skin incision was made at 0718  with delivery of infant at 0720.  ? ?After an adequate timeout was performed, a Pfannenstiel skin incision was made with scalpel and carried through to the underlying layer of fascia. The fascia was incised in the midline and this incision was extended bilaterally bluntly. The peritoneum was entered bluntly. An Alexis retractor was placed to aid in visualization of the uterus.  Attention was turned to the lower uterine segment where a transverse hysterotomy was made with a scalpel and extended bilaterally bluntly. The fetal head was wedged into the pelvis, so infant was delivered breech. The cord was clamped and cut immediately and infant was handed over to awaiting neonatology team.  ? ?Uterine massage was then administered and the placenta delivered intact with three-vessel cord. The uterus was then cleared of clot and debris.  The hysterotomy was closed with 0 Vicryl in a running locked fashion. Several figure of eight stitches were used. Overall, excellent hemostasis was noted. The abdomen and the pelvis were cleared of all clot and debris and the Jon Gillslexis was removed. Hemostasis was confirmed on all surfaces. Arista was placed. The fascia was then closed using 0 Vicryl in a running fashion. The subcutaneous layer was reapproximated with plain gut and the skin was closed with 4-0 vicryl. Sponge, lap, instrument and needle counts were correct. She was taken to the recovery room in stable condition.  ? ? ?Dominique Vega, Dominique N, DO ?07/31/2021 8:02 AM  ? ? ?Attestation of Attending Supervision of Obstetric Fellow: Evaluation and management procedures were performed by the Obstetric Fellow under my supervision and collaboration.  I have reviewed the Obstetric Fellow's note and chart, and I agree with the management and plan. I have also made any necessary editorial changes. ? ? ?Sharon SellerJennifer M Antonius Hartlage, DO ?Attending Obstetrician &  Editor, commissioning, Water engineer ?[facility] ?07/31/2021  12:15 PM ? ? ?

## 2021-07-31 NOTE — Anesthesia Postprocedure Evaluation (Signed)
Anesthesia Post Note ? ?Patient: Dominique Vega ? ?Procedure(s) Performed: CESAREAN SECTION ? ?  ? ?Patient location during evaluation: PACU ?Anesthesia Type: Epidural and General ?Level of consciousness: sedated ?Pain management: pain level controlled ?Vital Signs Assessment: post-procedure vital signs reviewed and stable ?Respiratory status: spontaneous breathing and respiratory function stable ?Cardiovascular status: stable ?Postop Assessment: no apparent nausea or vomiting ?Anesthetic complications: no ? ? ?No notable events documented. ? ?Last Vitals:  ?Vitals:  ? 07/31/21 0938 07/31/21 0945  ?BP:  (!) 140/100  ?Pulse: 96 98  ?Resp: 14 (!) 9  ?Temp:    ?SpO2: 93% 91%  ?  ?Last Pain:  ?Vitals:  ? 07/31/21 0955  ?TempSrc:   ?PainSc: 0-No pain  ? ?Pain Goal:   ? ?LLE Motor Response: Purposeful movement (07/31/21 0945) ?LLE Sensation: Tingling (07/31/21 0945) ?RLE Motor Response: Purposeful movement (07/31/21 0945) ?RLE Sensation: Tingling (07/31/21 0945) ?L Sensory Level: S4-Perianal area (07/31/21 0945) ?R Sensory Level: S4-Perianal area (07/31/21 0945) ?Epidural/Spinal Function Cutaneous sensation: Tingles (07/31/21 0945), Patient able to flex knees: Yes (07/31/21 0945), Patient able to lift hips off bed: Yes (07/31/21 0945), Back pain beyond tenderness at insertion site: No (07/31/21 0945), Progressively worsening motor and/or sensory loss: No (07/31/21 0945), Bowel and/or bladder incontinence post epidural: No (07/31/21 0945) ? ?Janssen Zee DANIEL ? ? ? ? ?

## 2021-07-31 NOTE — Progress Notes (Signed)
Labor Progress Note ?Dominique Vega is a 31 y.o. G4P0030 at 23w6dpresented for IOL due to pre-e with SF.  ?  ?S: Doing well with FOB at bs for support. Reports minimal pressure with CTXs. Does not report abnormal s/s. ?  ?O:  ?BP 99/57   Pulse 93   Temp 98 ?F (Oral)   Resp 16   Ht 5' (1.524 m)   Wt 88.5 kg   LMP 11/29/2020   SpO2 98%   BMI 38.08 kg/m?  ?EFM: 130/mod/15x15/none ?  ?CVE: Dilation: 5 ?Effacement (%): 80 ?Cervical Position: Middle ?Station: -1 ?Presentation: Vertex ?Exam by: OGreggory Keen SNM ?  ?  ?A&P: 31y.o. G4P0030 339w6d?#Labor: Progressing well. Second dose of penicillin being given at this time. Pt counseled on risks and benefits of AROM, verbalized understanding and agrees to same. AROM now, moderate clear fluid. Pt tolerated procedure well.  ?#Pain: Epidural  ?#FWB: Cat I  ?#GBS unknown (technically had a negative swab at 19 weeks), continue PCN dosing   ?  ?#Pre-e with SF: BP continue to be mild range. Previously was on labetalol 600 TID + procardia, changed to labetalol 300 TID as of 0147. Most recent BP monitoring revealed soft pressures. Next dose of labetalol held due to parameters not being met. No symptoms. Pre-e labs at 2000 WNL, repeat in the am already ordered.  ?  ?Electronically Signed By: ?OnGreggory KeenSNM ?07/31/21 6:00 AM   ?

## 2021-07-31 NOTE — Discharge Summary (Signed)
? ?  Postpartum Discharge Summary ? ?Date of Service updated ? ?   ?Patient Name: Dominique Vega ?DOB: Sep 12, 1990 ?MRN: [id number] ? ?Date of admission: 07/29/2021 ?Delivery date:07/31/2021  ?Delivering provider: Janyth Pupa  ?Date of discharge: 08/03/2021 ? ?Admitting diagnosis: Preeclampsia, severe [O14.10] ?Intrauterine pregnancy: [redacted]w[redacted]d    ?Secondary diagnosis:  Principal Problem: ?  Preeclampsia, severe ?Active Problems: ?  Small for gestational age ?  Rubella non-immune status, antepartum ?  Anxiety ?  S/P emergency C-section ? ?Additional problems: None    ?Discharge diagnosis: Preterm Pregnancy Delivered and Preeclampsia (severe)                                              ?Post partum procedures: None ?Augmentation: AROM, Pitocin, Cytotec, and IP Foley ?Complications: None ? ?Hospital course: Induction of Labor With Cesarean Section   ?31y.o. yo [id number] admitted to the hospital 07/29/2021 for induction of labor. Patient had a labor course significant for IOL due to severe pre-eclampsia.  The patient went for cesarean section due to Non-Reassuring FHR, proceeded with STAT C/S due to prolonged deceleration/fetal bradycardia without resolution despite intervention. Delivery details are as follows: ?Membrane Rupture Time/Date: 5:55 AM ,07/31/2021   ?Delivery Method:C-Section, Low Transverse  ?Details of operation can be found in separate operative Note.   ? ?Patient had an uncomplicated postpartum course.  She received Magnesium for seizure prophylaxis following her delivery. She had mild anemia following delivery but also had mild anemia over the course of labor (10.2). She was started on Procardia and lasix for blood pressure control. She is ambulating, tolerating a regular diet, passing flatus, and urinating well.  Patient is discharged home in stable condition on 08/03/21.     ? ?Newborn Data: ?Birth date:07/31/2021  ?Birth time:7:20 AM  ?Gender:Female  ?Living status:Living  ?Apgars:4 ,8   ?Weight:2090 g                               ? ?Magnesium Sulfate received: Yes: Seizure prophylaxis ?BMZ received: No (but s/p in 3/15 and 3/16)  ?Rhophylac:No ?MMR:No ?T-DaP:Given prenatally ?Flu: N/A ?Transfusion:No ? ?Physical exam  ?Vitals:  ? 08/02/21 0744 08/02/21 2002 08/02/21 2322 08/03/21 0449  ?BP: (!) 147/83 (!) 148/95 135/81 130/86  ?Pulse: (!) 103 (!) 109 94 86  ?Resp: '15 17 18 18  ' ?Temp: 98 ?F (36.7 ?C) 98.4 ?F (36.9 ?C) 98.2 ?F (36.8 ?C) 97.8 ?F (36.6 ?C)  ?TempSrc: Oral Oral Oral Oral  ?SpO2: 99% 99% 100% 100%  ?Weight:      ?Height:      ? ?General: alert, cooperative, and no distress ?Lochia: appropriate ?Uterine Fundus: firm ?Incision: Healing well with no significant drainage ?DVT Evaluation: No cords or calf tenderness. ?Labs: ?Lab Results  ?Component Value Date  ? WBC 19.6 (H) 08/01/2021  ? HGB 9.2 (L) 08/01/2021  ? HCT 27.5 (L) 08/01/2021  ? MCV 92.9 08/01/2021  ? PLT 337 08/01/2021  ? ? ?  Latest Ref Rng & Units 07/31/2021  ?  8:57 AM  ?CMP  ?Glucose 70 - 99 mg/dL 105    ?BUN 6 - 20 mg/dL 7    ?Creatinine 0.44 - 1.00 mg/dL 0.63    ?Sodium 135 - 145 mmol/L 135    ?Potassium 3.5 - 5.1 mmol/L 4.6    ?  Chloride 98 - 111 mmol/L 106    ?CO2 22 - 32 mmol/L 19    ?Calcium 8.9 - 10.3 mg/dL 7.0    ?Total Protein 6.5 - 8.1 g/dL 5.3    ?Total Bilirubin 0.3 - 1.2 mg/dL 0.4    ?Alkaline Phos 38 - 126 U/L 86    ?AST 15 - 41 U/L 17    ?ALT 0 - 44 U/L 11    ? ?Edinburgh Score: ?   ? View : No data to display.  ?  ?  ?  ? ? ? ?After visit meds:  ?Allergies as of 08/03/2021   ? ?   Reactions  ? Penicillins Diarrhea, Nausea And Vomiting  ? ?  ? ?  ?Medication List  ?  ? ?STOP taking these medications   ? ?labetalol 200 MG tablet ?Commonly known as: NORMODYNE ?  ?meclizine 25 MG tablet ?Commonly known as: ANTIVERT ?  ?ondansetron 4 MG disintegrating tablet ?Commonly known as: ZOFRAN-ODT ?  ?promethazine 25 MG tablet ?Commonly known as: PHENERGAN ?  ? ?  ? ?TAKE these medications   ? ?acetaminophen 325 MG  tablet ?Commonly known as: TYLENOL ?Take 2 tablets (650 mg total) by mouth every 4 (four) hours as needed for mild pain (temperature > 101.5.). ?  ?escitalopram 20 MG tablet ?Commonly known as: LEXAPRO ?Take 20 mg by mouth daily. ?  ?furosemide 20 MG tablet ?Commonly known as: LASIX ?Take 1 tablet (20 mg total) by mouth daily. ?  ?ibuprofen 600 MG tablet ?Commonly known as: ADVIL ?Take 1 tablet (600 mg total) by mouth every 6 (six) hours. ?  ?NIFEdipine 60 MG 24 hr tablet ?Commonly known as: ADALAT CC ?Take 1 tablet (60 mg total) by mouth daily. ?What changed:  ?medication strength ?how much to take ?  ?oxyCODONE 5 MG immediate release tablet ?Commonly known as: Oxy IR/ROXICODONE ?Take 1 tablet (5 mg total) by mouth every 4 (four) hours as needed (pain scale 4-7). ?  ?PNV Prenatal Plus Multivitamin 27-1 MG Tabs ?Take 1 tablet by mouth daily. ?  ? ?  ? ?  ?  ? ? ?  ?Discharge Care Instructions  ?(From admission, onward)  ?  ? ? ?  ? ?  Start     Ordered  ? 08/03/21 0000  Discharge wound care:       ?Comments: If dressing is present, remove after 7 days from surgery  ? 08/03/21 0746  ? ?  ?  ? ?  ? ? ? ?Discharge home in stable condition ?Infant Feeding: Breast ?Infant Disposition:NICU ?Discharge instruction: per After Visit Summary and Postpartum booklet. ?Activity: Advance as tolerated. Pelvic rest for 6 weeks.  ?Diet: routine diet ?Future Appointments:No future appointments. ?Follow up Visit: ? Follow-up Information   ? ? [facility] Follow up in 1 week(s).   ?Specialty: Obstetrics and Gynecology ?Why: Blood pressure and incision check. ?Contact information: ?[address] ?[place] ?[phone] ? ?  ?  ? ?  ?  ? ?  ? ? ?Message sent to [facility] by Dr Higinio Plan ?Please schedule this patient for a In person postpartum visit in 6 weeks with the following provider: Any provider. ?Additional Postpartum F/U:Postpartum Depression checkup, Incision check 1 week, and  BP check 1 week  ?High risk pregnancy complicated by: HTN ?Delivery mode:  C-Section, Low Transverse  ?Anticipated Birth Control:   NFP ? ? ?08/03/2021 ?Radene Gunning, MD ? ? ? ?

## 2021-07-31 NOTE — Lactation Note (Signed)
This note was copied from a baby's chart. ? ?NICU Lactation Consultation Note ? ?Patient Name: Dominique Vega ?Today's Date: 07/31/2021 ?Age:31 hours ? ? ?Subjective ?Reason for consult: Initial assessment; NICU baby ?Mother intends on bf'ing. She recalls + breast changes in early pregnancy and denies hx breast surgery/trauma. We reviewed pumping strategy and IDF when baby is ready.  ? ?Mom has a pump at home for use p d/c. ? ?LC assisted with initial pumping at 3 hours p delivery. ? ?Objective ?Maternal data: ?[id number]  ?C-Section, Low Transverse ?Does the patient have breastfeeding experience prior to this delivery?: No ? ?Risk factor for low milk supply:: Pre-E, maternal-infant separation ? ? ?Pump: DEBP (Spectra S II) ? ?Assessment ?Maternal: ?Normal breast symmetry  ? ?Intervention/Plan ?Interventions: Education; Breast feeding basics reviewed; Infant Driven Feeding Algorithm education ?Provided NICU book and LC services info. ? ?Tools: Pump ?Pump Education: Milk Storage; Setup, frequency, and cleaning ? ?Plan: ?Consult Status: Follow-up ? ?NICU Follow-up type: New admission follow up; Verify absence of engorgement; Verify onset of copious milk ? ?Mother to pump q3 for 15 minutes and take any EBM to NICU. ? ? ?Elder Negus ?07/31/2021, 12:14 PM ?

## 2021-07-31 NOTE — Anesthesia Procedure Notes (Signed)
Epidural ?Patient location during procedure: OB ?Start time: 07/31/2021 12:50 AM ?End time: 07/31/2021 1:00 AM ? ?Staffing ?Anesthesiologist: Mellody Dance, MD ?Performed: anesthesiologist  ? ?Preanesthetic Checklist ?Completed: patient identified, IV checked, site marked, risks and benefits discussed, monitors and equipment checked, pre-op evaluation and timeout performed ? ?Epidural ?Patient position: sitting ?Prep: DuraPrep ?Patient monitoring: heart rate, cardiac monitor, continuous pulse ox and blood pressure ?Approach: midline ?Location: L2-L3 ?Injection technique: LOR saline ? ?Needle:  ?Needle type: Tuohy  ?Needle gauge: 17 G ?Needle length: 9 cm ?Needle insertion depth: 6.5 cm ?Catheter type: closed end flexible ?Catheter size: 20 Guage ?Catheter at skin depth: 11.5 cm ?Test dose: negative and Other ? ?Assessment ?Events: blood not aspirated, injection not painful, no injection resistance and negative IV test ? ?Additional Notes ?Informed consent obtained prior to proceeding including risk of failure, 1% risk of PDPH, risk of minor discomfort and bruising.  Discussed rare but serious complications including epidural abscess, permanent nerve injury, epidural hematoma.  Discussed alternatives to epidural analgesia and patient desires to proceed.  Timeout performed pre-procedure verifying patient name, procedure, and platelet count.  Patient tolerated procedure well. ? ? ? ? ?

## 2021-07-31 NOTE — Transfer of Care (Signed)
Immediate Anesthesia Transfer of Care Note ? ?Patient: Dominique Vega ? ?Procedure(s) Performed: CESAREAN SECTION ? ?Patient Location: PACU ? ?Anesthesia Type:General and Epidural ? ?Level of Consciousness: awake, alert  and oriented ? ?Airway & Oxygen Therapy: Patient Spontanous Breathing and Patient connected to face mask oxygen ? ?Post-op Assessment: Report given to RN and Post -op Vital signs reviewed and stable ? ?Post vital signs: Reviewed and stable ? ?Last Vitals:  ?Vitals Value Taken Time  ?BP 137/99 07/31/21 0930  ?Temp 36.4 ?C 07/31/21 0915  ?Pulse 97 07/31/21 0935  ?Resp 10 07/31/21 0935  ?SpO2 92 % 07/31/21 0935  ?Vitals shown include unvalidated device data. ? ?Last Pain:  ?Vitals:  ? 07/31/21 0915  ?TempSrc:   ?PainSc: 0-No pain  ?   ? ?  ? ?Complications: No notable events documented. ?

## 2021-07-31 NOTE — Anesthesia Preprocedure Evaluation (Addendum)
Anesthesia Evaluation  ?Patient identified by MRN, date of birth, ID band ?Patient awake ? ? ? ?Reviewed: ?Allergy & Precautions, NPO status , Patient's Chart, lab work & pertinent test results ? ?Airway ?Mallampati: III ? ?TM Distance: >3 FB ?Neck ROM: Full ? ? ? Dental ?no notable dental hx. ?(+) Dental Advisory Given ?  ?Pulmonary ?neg pulmonary ROS,  ?  ?Pulmonary exam normal ? ? ? ? ? ? ? Cardiovascular ?hypertension (preeclampsia on Mag), Pt. on medications ?Normal cardiovascular exam ? ? ?  ?Neuro/Psych ? Headaches, PSYCHIATRIC DISORDERS Anxiety   ? GI/Hepatic ?negative GI ROS, Neg liver ROS,   ?Endo/Other  ?negative endocrine ROS ? Renal/GU ?negative Renal ROS  ?negative genitourinary ?  ?Musculoskeletal ?negative musculoskeletal ROS ?(+)  ? Abdominal ?  ?Peds ?negative pediatric ROS ?(+)  Hematology ? ?(+) Blood dyscrasia, anemia ,   ?Anesthesia Other Findings ? ? Reproductive/Obstetrics ?(+) Pregnancy ? ?  ? ? ? ? ? ? ? ? ? ? ? ? ? ?  ?  ? ? ? ? ? ? ? ?Anesthesia Physical ?Anesthesia Plan ? ?ASA: 3 ? ?Anesthesia Plan: Epidural  ? ?Post-op Pain Management:   ? ?Induction:  ? ?PONV Risk Score and Plan: 2 and Treatment may vary due to age or medical condition ? ?Airway Management Planned: Natural Airway ? ?Additional Equipment: None ? ?Intra-op Plan:  ? ?Post-operative Plan:  ? ?Informed Consent: I have reviewed the patients History and Physical, chart, labs and discussed the procedure including the risks, benefits and alternatives for the proposed anesthesia with the patient or authorized representative who has indicated his/her understanding and acceptance.  ? ? ? ?Dental advisory given ? ?Plan Discussed with: Anesthesiologist and CRNA ? ?Anesthesia Plan Comments:   ? ? ? ? ? ?Anesthesia Quick Evaluation ? ?

## 2021-07-31 NOTE — Progress Notes (Signed)
Labor Progress Note ?Dominique Vega is a 31 y.o. G4P0030 at [redacted]w[redacted]d presented for IOL due to pre-e with SF.  ? ?S: S/p epidural placement and feeling comfortable.  ? ?O:  ?BP 133/84   Pulse 85   Temp 98.2 ?F (36.8 ?C) (Oral)   Resp 16   Ht 5' (1.524 m)   Wt 88.5 kg   LMP 11/29/2020   SpO2 98%   BMI 38.08 kg/m?  ?EFM: 130/mod/15x15/none ? ?CVE: Dilation: 5 ?Effacement (%): 80 ?Cervical Position: Middle ?Station: -1 ?Presentation: Vertex ?Exam by:: lee ? ? ?A&P: 31 y.o. G4P0030 [redacted]w[redacted]d  ?#Labor: Progressing well. Unfortunately had not received her PCN yet due to unknown GBS with pre-term status. Will give this dose now and then recheck in the next 3 hours with plan to AROM. Cont pit as is.  ?#Pain: Epidural  ?#FWB: Cat I  ?#GBS unknown (technically had a negative swab at 19 weeks), plan for PCN now.  ? ?#Pre-e with SF: BP has been mild range. Previously was on labetalol 600 TID + procardia, now currently receiving labetalol 300 TID as pressures were lower earlier yesterday. No symptoms. Pre-e labs at 2000 WNL, repeat in the am already ordered.  ? ?Allayne Stack, DO ?1:47 AM  ?

## 2021-07-31 NOTE — Addendum Note (Signed)
Addendum  created 07/31/21 1514 by Graciela Husbands, CRNA  ? Flowsheet accepted, Intraprocedure Devices edited, Intraprocedure Event edited, Intraprocedure Meds edited, Intraprocedure Staff edited  ?  ?

## 2021-07-31 NOTE — Progress Notes (Signed)
Documentation after C-section.  ? ?Went to bedside after noted fetal deceleration into the 90's. When arrived to the room, she was placed onto her left lateral with FHR in the 80's. Checked cervix and unchanged, 5cm/-1. No cord present. Placed onto her right side with no resolution in HR. Given Terb. Placed into hands/knees with HR back up to 100 (occasionally 110). Pit had already been off for about 45 minutes due to a prolonged deceleration at that time.  ? ?Down for 7-8 minutes prior to returning to 110 and then went back down into the 80's. Dr. Charlotta Newton presented to bedside, proceeded with STAT C/S.  ? ?The risks of cesarean section were briefly discussed with the patient included but were not limited to: bleeding which may require transfusion or reoperation; infection which may require antibiotics; injury to bowel, bladder, ureters or other surrounding organs; injury to the fetus.  ? ?Allayne Stack, DO  ?

## 2021-07-31 NOTE — Anesthesia Procedure Notes (Addendum)
Procedure Name: Intubation ?Date/Time: 07/31/2021 7:17 AM ?Performed by: Heather Roberts, MD ?Pre-anesthesia Checklist: Patient identified, Emergency Drugs available, Suction available and Patient being monitored ?Patient Re-evaluated:Patient Re-evaluated prior to induction ?Oxygen Delivery Method: Circle system utilized ?Preoxygenation: Pre-oxygenation with 100% oxygen ?Induction Type: IV induction, Rapid sequence and Cricoid Pressure applied ?Ventilation: Mask ventilation without difficulty ?Laryngoscope Size: Hyacinth Meeker and 2 ?Grade View: Grade II ?Tube type: Oral ?Tube size: 7.0 mm ?Number of attempts: 2 ?Airway Equipment and Method: Stylet and Oral airway ?Placement Confirmation: ETT inserted through vocal cords under direct vision, positive ETCO2 and breath sounds checked- equal and bilateral ?Secured at: 21 cm ?Tube secured with: Tape ?Dental Injury: Teeth and Oropharynx as per pre-operative assessment  ? ? ? ? ?

## 2021-08-01 ENCOUNTER — Encounter (HOSPITAL_COMMUNITY): Payer: Self-pay | Admitting: Obstetrics & Gynecology

## 2021-08-01 DIAGNOSIS — Z98891 History of uterine scar from previous surgery: Secondary | ICD-10-CM

## 2021-08-01 LAB — CBC
HCT: 27.5 % — ABNORMAL LOW (ref 36.0–46.0)
Hemoglobin: 9.2 g/dL — ABNORMAL LOW (ref 12.0–15.0)
MCH: 31.1 pg (ref 26.0–34.0)
MCHC: 33.5 g/dL (ref 30.0–36.0)
MCV: 92.9 fL (ref 80.0–100.0)
Platelets: 337 10*3/uL (ref 150–400)
RBC: 2.96 MIL/uL — ABNORMAL LOW (ref 3.87–5.11)
RDW: 14.6 % (ref 11.5–15.5)
WBC: 19.6 10*3/uL — ABNORMAL HIGH (ref 4.0–10.5)
nRBC: 0 % (ref 0.0–0.2)

## 2021-08-01 MED ORDER — MAGNESIUM SULFATE 40 GM/1000ML IV SOLN: 2.0000 g/h | INTRAVENOUS | Status: DC

## 2021-08-01 MED ORDER — MAGNESIUM SULFATE 40 GM/1000ML IV SOLN: 2.0000 g/h | INTRAVENOUS | Status: AC

## 2021-08-01 MED ORDER — FUROSEMIDE 40 MG PO TABS
20.0000 mg | ORAL_TABLET | Freq: Every day | ORAL | Status: DC
  Administered 2021-08-01 – 2021-08-03 (×3): 20 mg via ORAL
  Filled 2021-08-01 (×3): qty 1

## 2021-08-01 NOTE — Lactation Note (Addendum)
This note was copied from a baby's chart. ? ?NICU Lactation Consultation Note ? ?Patient Name: Dominique Vega ?Today's Date: 08/01/2021 ?Age:31 years ? ?Subjective ?Reason for consult: Follow-up assessment; NICU baby ?Follow up to see how pumping is going for mother. Mom is continuing to pump every 3 hours and has been encouraged by the volume at this time. Noticed condensation in the tubing and told parents to run DEBP for additional time after pump session without attachments to remove any condensation.  ? ?Objective ?Infant feeding assessment: ?Scale for Readiness: 2 ?Scale for Quality: 4 ? ? ?Maternal data: ?[id number]  ?C-Section, Low Transverse ? ?Does the patient have breastfeeding experience prior to this delivery?: No ? ?Pumping frequency: q3 ? ?Risk factor for low milk supply:: Pre-E, maternal-infant separation ? ?Pump: DEBP (Spectra S II) ? ?Assessment ?Maternal: Normal physiological breast changes.  ? ?Milk volume: Normal ? ? ?Intervention/Plan ?Interventions: Education; DEBP ? ?Tools: Pump ?Pump Education: Setup, frequency, and cleaning ? ?Plan: ?Pump q3 hours for 15 minutes ? ?Consult Status: Follow-up ? ?NICU Follow-up type: Maternal D/C visit; Verify onset of copious milk; Verify absence of engorgement; Weekly NICU follow up ? ?[place] ?08/01/2021, 3:19 PM ?

## 2021-08-01 NOTE — Progress Notes (Signed)
Subjective: ?Postpartum Day 1: Cesarean Delivery for NRFHT's and SPEC ?Pt has no complaints this morning. Denies HA or visual changes. ?Ambulating and voiding without problems.  ?Tolerating diet. Good pain control.  ?Breast pumping ?Objective: ?Vital signs in last 24 hours: ?Temp:  [97.7 ?F (36.5 ?C)-98.6 ?F (37 ?C)] 97.7 ?F (36.5 ?C) (04/01 0725) ?Pulse Rate:  [89-111] 89 (04/01 0725) ?Resp:  [9-18] 18 (04/01 0725) ?BP: (124-151)/(80-109) 124/85 (04/01 0725) ?SpO2:  [90 %-100 %] 100 % (04/01 0725) ? ?Physical Exam:  ?General: alert ?Lochia: appropriate ?Uterine Fundus: firm ?Incision: healing well ?DVT Evaluation: No evidence of DVT seen on physical exam. ? ?Recent Labs  ?  07/31/21 ?4332 08/01/21 ?9518  ?HGB 10.4* 9.2*  ?HCT 30.7* 27.5*  ? ? ?Assessment/Plan: ?Status post Cesarean section. Doing well postoperatively.  ?S/P magnesium. BP stable with current therapy. Infant stable in NICU per parents. Continue with progressive care ? ?Dominique Vega ?08/01/2021, 9:35 AM ? ? ?

## 2021-08-02 LAB — PATHOLOGIST SMEAR REVIEW

## 2021-08-02 MED ORDER — SODIUM CHLORIDE 0.9% FLUSH: 3.0000 mL | Freq: Two times a day (BID) | INTRAVENOUS | Status: DC

## 2021-08-02 NOTE — Lactation Note (Signed)
This note was copied from a baby's chart. ?Lactation Consultation Note ? ?Patient Name: Dominique Vega ?Today's Date: 08/02/2021 ?Reason for consult: Follow-up assessment;Mother's request;Primapara;1st time breastfeeding;NICU baby;Late-preterm 34-36.6wks;Infant < 6lbs;Other (Comment) (ovarian cyst) ?Age:31 hours ? ?Visited with mom of 35 hours old LPI NICU female, she's a P1 and reports the full onset of lactogenesis II. Reviewed pump settings, pumping schedule, lactogenesis II, feeding cues, pre-feeding activites and readiness. Ms. Nieland asked for latch assistance for the 12 pm feeding but it was delayed due to baby needing an X-ray. This was the first time baby went to breast since NICU admission. ? ?NICU Architect took baby swaddled to mom's left breast, baby doing some subtle feeding cues, assisted with hand expression and finger feeding, baby would suck on a gloved finger but when transitioned to the breast, latch was shallow and engaged in NNS (see LATCH score). Baby "[name]" is already taking bottles, FOB plans on doing a bottle for the 3 pm feeding since baby will be getting a full gavage for the 12 pm. ? ?Maternal Data ? Mom's milk is in and her supply is WNL ? ?Feeding ?Mother's Current Feeding Choice: Breast Milk and Donor Milk ? ?LATCH Score ?Latch: Repeated attempts needed to sustain latch, nipple held in mouth throughout feeding, stimulation needed to elicit sucking reflex. (on and off, baby would do some sucking but then she'd stop. She'd start sucking again with breast compressions) ? ?Audible Swallowing: None (NNS was the predominant (only) pattern during this feeding) ? ?Type of Nipple: Everted at rest and after stimulation ? ?Comfort (Breast/Nipple): Soft / non-tender ? ?Hold (Positioning): Assistance needed to correctly position infant at breast and maintain latch. ? ?LATCH Score: 6 ? ?Lactation Tools Discussed/Used ?Tools: Pump ?Breast pump type: Double-Electric Breast Pump ?Pump Education:  Setup, frequency, and cleaning;Milk Storage ?Reason for Pumping: LPI in NICU ?Pumping frequency: 7-8 times/24 hours ?Pumped volume: 25 mL (25-30 ml) ? ?Interventions ?Interventions: Breast feeding basics reviewed;Breast massage;Hand express;DEBP;Coconut oil;Education;Adjust position;Support pillows;Breast compression ? ?Plan of care ?Encouraged mom to continue pumping consistently every 3 hours, at least 8 pumping sessions/24 hours ?She'll switch her Symphony pump to expression mode ?Mom will continue taking baby to breast strictly on feeding cues ?Parents will continue working on bottle feedings ? ?FOB present and very supportive. All questions and concerns answered, parents to call NICU LC PRN. ? ?Discharge ?Pump: DEBP;Personal (Spectra 2) ? ?Consult Status ?Consult Status: Follow-up ?Date: 08/02/21 ?Follow-up type: In-patient ? ? ?Arlyne Brandes S Dilynn Munroe ?08/02/2021, 1:00 PM ? ? ? ?

## 2021-08-02 NOTE — Progress Notes (Signed)
Subjective: ?Postpartum Day 2: Cesarean Delivery for NRFHT's and SPEC ? ?[name] has no complaints this morning. Denies HA or visual changes. ?Ambulating, voiding, tolerating diet and good oral pain control. ?Breast pumping ? ?Objective: ?Vital signs in last 24 hours: ?Temp:  [97.9 ?F (36.6 ?C)-98.5 ?F (36.9 ?C)] 98 ?F (36.7 ?C) (04/02 0744) ?Pulse Rate:  [92-107] 103 (04/02 0744) ?Resp:  [15-19] 15 (04/02 0744) ?BP: (130-152)/(77-94) 147/83 (04/02 0744) ?SpO2:  [98 %-100 %] 99 % (04/02 0744) ? ?Physical Exam:  ?General: alert ?Lochia: appropriate ?Uterine Fundus: firm ?Incision: healing well ?DVT Evaluation: No evidence of DVT seen on physical exam. ? ?Recent Labs  ?  07/31/21 ?0093 08/01/21 ?8182  ?HGB 10.4* 9.2*  ?HCT 30.7* 27.5*  ? ? ?Assessment/Plan: ?Status post Cesarean section. Doing well postoperatively.  ?Continue current care. ? ?Dominique Vega ?08/02/2021, 8:57 AM ? ? ?

## 2021-08-03 ENCOUNTER — Other Ambulatory Visit (HOSPITAL_COMMUNITY): Payer: Self-pay

## 2021-08-03 ENCOUNTER — Ambulatory Visit: Payer: Self-pay

## 2021-08-03 LAB — SURGICAL PATHOLOGY

## 2021-08-03 MED ORDER — OXYCODONE HCL 5 MG PO TABS
5.0000 mg | ORAL_TABLET | ORAL | 0 refills | Status: DC | PRN
  Filled 2021-08-03: qty 20, 4d supply, fill #0

## 2021-08-03 MED ORDER — NIFEDIPINE ER 60 MG PO TB24
60.0000 mg | ORAL_TABLET | Freq: Every day | ORAL | 1 refills | Status: DC
  Filled 2021-08-03: qty 30, 30d supply, fill #0

## 2021-08-03 MED ORDER — IBUPROFEN 600 MG PO TABS
600.0000 mg | ORAL_TABLET | Freq: Four times a day (QID) | ORAL | 0 refills | Status: DC
  Filled 2021-08-03: qty 30, 8d supply, fill #0

## 2021-08-03 MED ORDER — FUROSEMIDE 20 MG PO TABS
20.0000 mg | ORAL_TABLET | Freq: Every day | ORAL | 0 refills | Status: DC
  Filled 2021-08-03: qty 5, 5d supply, fill #0

## 2021-08-03 MED ORDER — ACETAMINOPHEN 325 MG PO TABS
650.0000 mg | ORAL_TABLET | ORAL | 0 refills | Status: DC | PRN
  Filled 2021-08-03: qty 30, 3d supply, fill #0

## 2021-08-03 NOTE — Lactation Note (Signed)
This note was copied from a baby's chart. ?Lactation Consultation Note ? ?Patient Name: Dominique Vega ?Today's Date: 08/03/2021 ?Reason for consult: Primapara;Infant < 6lbs;Late-preterm 34-36.6wks;Other (Comment);1st time breastfeeding;Maternal discharge;NICU baby;Follow-up assessment (Ovarian Cyst) ?Age:31 hours ? ?Visited mother of 65 hr old infant, "Dominique Vega", in NICU. Mother reports that pumping has been going well and her outputs have increased. She expressed no pain in her breasts or nipple trauma at this time. Her goal is to feed baby at the breast, but she is okay with bottle feeding if necessary. Reviewed pumping schedule, feeding cues, and IDF-2. She reports that she has attempted to bring baby to breast since initial visit and she was more confident with less people in the room when attempting to latch. Praised mother for her efforts and encouraged to continue bringing baby to breast at feeding times. Discussed the importance of STS and positioning of baby at the breast during feeds for positive experiences. Gave education regarding normal feeding for infant's current age. She had questions regarding how to know baby is getting enough milk when she is feeding at the breast and no longer pumping.  Discussed feeding cues and signs of fullness in baby. Further education provided regarding engorgement management, nipple care, and milk output expectations.  ? ?Maternal Data ?Does the patient have breastfeeding experience prior to this delivery?: No ? ?Feeding ?Mother's Current Feeding Choice: Breast Milk and Donor Milk ? ?Lactation Tools Discussed/Used ?Tools: Pump;Coconut oil;Other (comment) (Silverettes) ?Breast pump type: Double-Electric Breast Pump ?Pump Education: Setup, frequency, and cleaning ?Reason for Pumping: Infant in NICU ?Pumping frequency: Every 3hrs, 8x/24hrs ?Pumped volume: 43 mL ? ?Interventions ?Interventions: Breast feeding basics reviewed;Skin to skin;Breast massage;Expressed milk;Coconut  oil;DEBP;Education;Infant Driven Feeding Algorithm education ? ?Plan of Care: ?Encouraged to continue pumping every 3 hours, at least 8x/24hrs. ?Continue bringing baby to breast based on feeding cues at appropriate feeding times.  ?Utilize coconut oil prior to pumping and breast milk following pumping to promote healing.  ? ?FOB present for session and had no questions. All mother's questions and concern were addressed. Parents to call NICU LC PRN.  ? ?Discharge ?Discharge Education: Engorgement and breast care ?Pump: DEBP;Personal (Spectra S2) ? ?Consult Status ?Consult Status: Follow-up ?Date: 08/03/21 ? ? ? ?Ama Mcmaster-Pervall  ?08/03/2021, 1:37 PM ? ? ? ?

## 2021-08-03 NOTE — Lactation Note (Signed)
This note was copied from a baby's chart. ?Lactation Consultation Note ? ?Patient Name: Dominique Vega ?Today's Date: 08/03/2021 ?Reason for consult: Follow-up assessment;NICU baby;Breastfeeding assistance;Late-preterm 34-36.6wks ?Age:31 days ? ?Mother was seen for assist with latch at infant feeding time. Infant in football hold at the L breast upon arrival. Mother reports that "[name]" was demonstrating feeding cues 15 min prior to feeding time and she brought baby to breast. She reports that she hand expressed some milk and rubbed this on baby's lips and baby latched on quickly. Per parents, following ~1 min of breast feeding, infant pulled NG tube and became upset. Mother consoled infant and after infant was calm, attempted to latch. Mother hand expressed milk and LC placed milk on gloved finger into infant's mouth. Suck elicited for a brief period. Attempted to re-latch infant, however, infant too sleepy to continue feeding. Encouraged mother to continue STS at this time and to continue bring baby to breast while in the NICU. Reviewed IDF.  ? ?Feeding ?Mother's Current Feeding Choice: Breast Milk and Donor Milk ? ? ?Interventions ?Interventions: Assisted with latch;Skin to skin;Breast massage;Hand express;Adjust position;Support pillows ? ?Plan Of Care: ?Continue to pump every 3hrs for 8x/24hrs.  ?Adjust pumping schedule to pump 30 min after feeding time to allow for infant attempt at the breast. ?LC to see with feed on 4/4 at 9:00.  ? ?FOB present. All questions and concerns address. Parents to call NICU LC PRN. ?  ? ?Consult Status ?Consult Status: Follow-up ?Date: 08/03/21 ?Follow-up type: In-patient ? ? ? ?Tacoya Altizer-Pervall, Lactation Student  ?08/03/2021, 3:28 PM ? ? ? ?

## 2021-08-03 NOTE — Progress Notes (Signed)
Patient discharged to home with husband.  Husband transported patient to NICU with plans to leave hospital from there.  No equipment ordered for home at discharge. ?

## 2021-08-04 ENCOUNTER — Ambulatory Visit: Payer: BC Managed Care – PPO

## 2021-08-04 ENCOUNTER — Ambulatory Visit: Payer: Self-pay

## 2021-08-04 NOTE — Lactation Note (Signed)
This note was copied from a baby's chart. ? ?NICU Lactation Consultation Note ? ?Patient Name: Dominique Vega ?Today's Date: 08/04/2021 ?Age:31 days ? ? ?Subjective ?Reason for consult: Follow-up assessment; Mother's request; Primapara; 1st time breastfeeding; NICU baby; Late-preterm 34-36.6wks ? ?Lactation followed up. I assisted with placing baby Dominique Vega in cross cradle hold on the right breast. I encouraged Dominique Vega to grasp the breast in a "U" hold. Baby latched to the breast with rhythmic suckling sequences, but became fatigued quickly and released the breast. She did not cue again. ? ?Dominique Vega remarked that she had success suing a size 24 nipple shield last night with the help of an Therapist, sports. She requested another shield as she could not find the one from last night. I provided it to her and demonstrated how to place it. The shield is not indicated at this time as baby can latch without one, but follow up from lactation will help to monitor baby's progress at the breast and make future determinations. ? ?Dominique Vega is pumping regularly. She is having an issue with milk leaking from her spectra flange. We discussed obtaining a pumping bra to help keep flanges in place, leaning over periodically when pumping, and holding flanges at the base near the breast to keep them from sliding. ? ?They are also going to investigate obtaining an oval flange like the Medela flanges. I suggested they research Pumping Pals or May-mom. ? ?I provided a band to work as a Printmaker. They may call for follow up today. ? ?Objective ?Infant data: ?Mother's Current Feeding Choice: Breast Milk ? ?Infant feeding assessment ?Scale for Readiness: 2 ?Scale for Quality: 2 ? ? ?Maternal data: ?[id number]  ?C-Section, Low Transverse ?Significant Breast History:: positive changes in pregnancy ? ?Current breast feeding challenges:: NICU ? ?Does the patient have breastfeeding experience prior to this delivery?: No ? ?Pumping frequency: q3 hours;  pumping after breast feeding attempts as well ?Pumped volume: 30 mL ? ? ?Pump: Personal (spectra) ? ?Assessment ?Infant: ?LATCH Score: 6 ? ?Feeding Status: IDF-2 ? ? ?Maternal: ?Milk volume: Normal ? ? ?Intervention/Plan ?Interventions: Breast feeding basics reviewed; Assisted with latch; Skin to skin; Hand express; Breast compression; Adjust position; Support pillows; Expressed milk; Education ? ?Tools: Nipple Jefferson Fuel; Other (comment) (patient requested a nipple shield, but we did not use one) ?Pump Education: Setup, frequency, and cleaning ? ?Plan: ?Consult Status: Follow-up ? ?NICU Follow-up type: New admission follow up; Verify absence of engorgement; Verify onset of copious milk; Assist with IDF-1 (Mother to pre-pump before breastfeeding); Assist with IDF-2 (Mother does not need to pre-pump before breastfeeding) ? ? ? ?Dominique Vega ?08/04/2021, 12:53 PM ?

## 2021-08-05 ENCOUNTER — Other Ambulatory Visit: Payer: BC Managed Care – PPO

## 2021-08-05 ENCOUNTER — Ambulatory Visit: Payer: BC Managed Care – PPO

## 2021-08-05 ENCOUNTER — Ambulatory Visit: Payer: Self-pay

## 2021-08-05 ENCOUNTER — Encounter: Payer: BC Managed Care – PPO | Admitting: Obstetrics and Gynecology

## 2021-08-05 NOTE — Lactation Note (Signed)
This note was copied from a baby's chart. ?Lactation Consultation Note ? ?Patient Name: Dominique Vega ?Today's Date: 08/05/2021 ?Reason for consult: Follow-up assessment;Primapara;1st time breastfeeding;NICU baby;Late-preterm 34-36.6wks;Infant < 6lbs ?Age:31 days ? ?LC student in to see mom who was present with FOB. Mom reports she has been pumping every 3 hours around the clock. She is yielding between 45-66ml per pump session. Mom has a double electric breast pump (Spectra S2) and she feels it is working well. She reports no pain or discomfort. Mom has been sleeping, hydrating to thirst, and eating.  ? ?Mom would like lactation to return at 3pm and/or 6pm care times for feeding assist, as mom and FOB were on their way out to get something to eat. ?  ? ?Consult Status ?Consult Status: Follow-up ?Date: 08/05/21 ?Follow-up type: In-patient ? ?Antionette Char ?08/05/2021, 1:23 PM ? ? ? ?

## 2021-08-05 NOTE — Lactation Note (Signed)
This note was copied from a baby's chart. ?Lactation Consultation Note ? ?Patient Name: Girl Renae Mottley ?Today's Date: 08/05/2021 ?Reason for consult: Follow-up assessment;NICU baby;Late-preterm 34-36.6wks;Primapara;1st time breastfeeding;Infant < 6lbs;Breastfeeding assistance ?Age:31 days ? ?[facility] student in for feeding assist. Upon entering the room, mom was latching infant on left breast in football hold using a Boppy support pillow. Attempted latching without nipple shield and infant was not grasping the nipple and maintaining latch. Applied 76mm nipple shield and infant latched. Observed 2-3 efficient suckling bursts over 4 minutes before infant fell asleep.  ? ?Reassured mom of normal behavior at this gestational age. Stressed the importance of pumping q3 for stimulation and maintenance of milk supply. ? ?LATCH Score ?Latch: Repeated attempts needed to sustain latch, nipple held in mouth throughout feeding, stimulation needed to elicit sucking reflex. ? ?Audible Swallowing: None ? ?Type of Nipple: Everted at rest and after stimulation ? ?Comfort (Breast/Nipple): Soft / non-tender ? ?Hold (Positioning): Assistance needed to correctly position infant at breast and maintain latch. ? ?LATCH Score: 6 ? ? ?Lactation Tools Discussed/Used ?Tools: Nipple Dorris Carnes ?Nipple shield size: 20 ? ?Interventions ?Interventions: Assisted with latch;Skin to skin;Adjust position;Support pillows;Education ? ?Consult Status ?Consult Status: Follow-up ?Date: 08/05/21 ?Follow-up type: In-patient ? ? ? ?Krislynn Gronau ?08/05/2021, 3:15 PM ? ? ? ?

## 2021-08-06 ENCOUNTER — Ambulatory Visit: Payer: Self-pay

## 2021-08-06 ENCOUNTER — Ambulatory Visit: Payer: BC Managed Care – PPO

## 2021-08-06 NOTE — Lactation Note (Signed)
This note was copied from a baby's chart. ? ?NICU Lactation Consultation Note ? ?Patient Name: Dominique Vega ?Today's Date: 08/06/2021 ?Age:31 years ? ? ?Subjective ?Reason for consult: Follow-up assessment; Primapara; 1st time breastfeeding; NICU baby; Late-preterm 34-36.6wks ? ?Lactation followed up with Ms. Zender. She stated that [name] completed breastfeeding on the right side in football hold. Mom endorses use of a 20 NS and states that it's helpful. We reviewed pumping practices. Baby is currently IDF-2. I made an follow up appointment with lactation for 4/7 at 1500. ? ?Objective ?Infant data: ?Mother's Current Feeding Choice: Breast Milk ? ?Infant feeding assessment ?Scale for Readiness: 2 ?Scale for Quality: 2 ? ?Maternal data: ?[id number]  ?C-Section, Low Transverse ? ?Does the patient have breastfeeding experience prior to this delivery?: No ? ?Pumping frequency: q3 hours ?Pumped volume: 50 mL (45-60 mls) ? ? ?Pump: Personal (Spectra) ? ?Assessment ?Infant: ?LATCH Score: 9 ? ?Feeding Status: IDF-2 ? ? ?Maternal: ?Milk volume: Normal ? ? ?Intervention/Plan ?Interventions: Education ? ?Tools: Nipple Jefferson Fuel ?Pump Education: Setup, frequency, and cleaning ?Nipple shield size: 20 ? ?Plan: ?Consult Status: Follow-up ? ?NICU Follow-up type: Assist with IDF-2 (Mother does not need to pre-pump before breastfeeding) ? ? ? ?Lenore Manner ?08/06/2021, 4:12 PM ?

## 2021-08-07 ENCOUNTER — Encounter: Payer: Self-pay | Admitting: Family Medicine

## 2021-08-07 ENCOUNTER — Ambulatory Visit: Payer: Self-pay

## 2021-08-07 NOTE — Lactation Note (Signed)
This note was copied from a baby's chart. ? ?NICU Lactation Consultation Note ? ?Patient Name: Dominique Vega ?Today's Date: 08/07/2021 ?Age:31 days ? ? ?Subjective ?Reason for consult: Follow-up assessment; Other (Comment) (infant d/c planning) ?Infant may d/c tomorrow. Mother continues to pump and bottle feed. She may re-challenge at breast p d/c. Parents had lots of questions regarding infant feeding p d/c. They requested an outpatient lactation referral. ?All questions and concerns were addressed. ? ?Objective ?Infant data: ?Mother's Current Feeding Choice: Breast Milk ? ?Infant feeding assessment ?Scale for Readiness: 1 ?Scale for Quality: 2 ? ? ?  ?Maternal data: ?[id number]  ?C-Section, Low Transverse ?Does the patient have breastfeeding experience prior to this delivery?: No ? ?Pumping frequency: q3 hours ?Pumped volume: 50 mL (45-60 mls) ?Pump: Personal (Spectra) ? ?Assessment ?Infant: ?LATCH Score: 5 ? ?Feeding Status: Ad lib ? ? ?Maternal: ?Milk volume: Normal ? ? ?Intervention/Plan ?Interventions: Education ? ?Tools: Nipple Jefferson Fuel ?Pump Education: Setup, frequency, and cleaning ?Nipple shield size: 20 ? ?Plan: ?Consult Status: Follow-up ? ?NICU Follow-up type: Assist with IDF-2 (Mother does not need to pre-pump before breastfeeding) ? ?Mother to continue pumping q3 and f/u with outpatient [place] p infant d/c. ? ?Gwynne Edinger ?08/07/2021, 3:21 PM ?

## 2021-08-08 ENCOUNTER — Ambulatory Visit: Payer: Self-pay

## 2021-08-08 NOTE — Lactation Note (Signed)
This note was copied from a baby's chart. ?Lactation Consultation Note ? ?Patient Name: Dominique Vega ?Today's Date: 08/08/2021 ?Reason for consult: Follow-up assessment;NICU baby;Late-preterm 34-36.6wks;Infant < 6lbs;1st time breastfeeding;Primapara;Other (Comment) (ovarian cyst) ?Age:31 days ? ?Visited with mom of 65 64/56 weeks old (adjusted) NICU female, she's a P1 and reports her milk supply continues to slowly increase. [name] continues putting baby to breast but she's also doing bottle feedings with fortified breastmilk, going home with Similac 22 calorie formula powder to fortify her breastmilk.  ? ?Reviewed lactogenesis II/III and the importance of continuing supplementing baby with bottles of her fortified EBM until [name] can fully empty both breasts. She'll continue working on pumping in the meantime. NP Porfirio Mylar has already sent an ambulatory referral for [facility] OP, this LC sent a referral to Renee Rival [name] would like to continue working on breastfeeding, she voiced the NS # 20 is helping baby [name] to latch. FOB present and very supportive, all questions and concerns answered, parents to contact LC services PRN. ? ?Maternal Data ? Mom's supply is WNL ? ?Feeding ?Mother's Current Feeding Choice: Breast Milk ?Nipple Type: Dr. Irving Burton Preemie ? ?Lactation Tools Discussed/Used ?Tools: Pump ?Breast pump type: Double-Electric Breast Pump ?Pump Education: Setup, frequency, and cleaning;Milk Storage ?Reason for Pumping: LPI in NICU ?Pumping frequency: 8 times/24 hours ?Pumped volume: 60 mL (60-75 ml) ? ?Interventions ?Interventions: DEBP;Education ? ?Discharge ?Discharge Education: Outpatient recommendation;Outpatient Epic message sent ?Pump: DEBP;Personal (Spectra) ? ?Consult Status ?Consult Status: Complete ?Date: 08/08/21 ?Follow-up type: Call as needed ? ? ?Yissel Habermehl S Jonhatan Hearty ?08/08/2021, 10:49 AM ? ? ? ?

## 2021-08-10 ENCOUNTER — Ambulatory Visit: Payer: BC Managed Care – PPO

## 2021-08-11 ENCOUNTER — Ambulatory Visit (INDEPENDENT_AMBULATORY_CARE_PROVIDER_SITE_OTHER): Payer: BC Managed Care – PPO | Admitting: *Deleted

## 2021-08-11 DIAGNOSIS — Z98891 History of uterine scar from previous surgery: Secondary | ICD-10-CM

## 2021-08-11 DIAGNOSIS — O1413 Severe pre-eclampsia, third trimester: Secondary | ICD-10-CM

## 2021-08-11 NOTE — Progress Notes (Signed)
Subjective:  ?Dominique Vega is a 31 y.o. female here for BP check and incision check. ? ?Hypertension ROS: taking medications as instructed, no medication side effects noted, no TIA's, no chest pain on exertion, no dyspnea on exertion, and no swelling of ankles.  ? ?Pt reports incision healing well ? ?Objective:  ?BP (!) 141/90   ?Appearance alert, well appearing, and in no distress. ?Incision healing well ? ? ?Assessment:   ?Blood Pressure today in office needs improvement.  ?Incision healed nicely, no signs of infection, scar looks good ? ?Plan:  ?Discussed with Dr Macon Large regarding BP today in office. To increase to 90mg  daily of Adalt and return in one week to recheck BP .  ? ?Pt is currently 2 weeks postpartum. Pt reports doing well. ? ? Edinburgh Postnatal Depression Scale - 08/11/21 1402   ? ?  ? Edinburgh Postnatal Depression Scale:  In the Past 7 Days  ? I have been able to laugh and see the funny side of things. 0   ? I have looked forward with enjoyment to things. 0   ? I have blamed myself unnecessarily when things went wrong. 2   ? I have been anxious or worried for no good reason. 2   ? I have felt scared or panicky for no good reason. 1   ? Things have been getting on top of me. 0   ? I have been so unhappy that I have had difficulty sleeping. 0   ? I have felt sad or miserable. 0   ? I have been so unhappy that I have been crying. 0   ? The thought of harming myself has occurred to me. 0   ? Edinburgh Postnatal Depression Scale Total 5   ? ?  ?  ? ?  ?  ? ? ?10/11/21, RN   ?

## 2021-08-12 ENCOUNTER — Ambulatory Visit: Payer: BC Managed Care – PPO

## 2021-08-12 ENCOUNTER — Other Ambulatory Visit: Payer: BC Managed Care – PPO

## 2021-08-12 ENCOUNTER — Encounter: Payer: BC Managed Care – PPO | Admitting: Obstetrics and Gynecology

## 2021-08-14 ENCOUNTER — Encounter: Payer: Self-pay | Admitting: Family Medicine

## 2021-08-14 ENCOUNTER — Ambulatory Visit: Payer: BC Managed Care – PPO

## 2021-08-14 DIAGNOSIS — F419 Anxiety disorder, unspecified: Secondary | ICD-10-CM | POA: Diagnosis not present

## 2021-08-14 DIAGNOSIS — G43909 Migraine, unspecified, not intractable, without status migrainosus: Secondary | ICD-10-CM | POA: Diagnosis not present

## 2021-08-14 DIAGNOSIS — M25531 Pain in right wrist: Secondary | ICD-10-CM | POA: Diagnosis not present

## 2021-08-17 ENCOUNTER — Ambulatory Visit (INDEPENDENT_AMBULATORY_CARE_PROVIDER_SITE_OTHER): Payer: BC Managed Care – PPO | Admitting: *Deleted

## 2021-08-17 VITALS — BP 121/76 | HR 118

## 2021-08-17 DIAGNOSIS — O165 Unspecified maternal hypertension, complicating the puerperium: Secondary | ICD-10-CM

## 2021-08-17 NOTE — Progress Notes (Cosign Needed)
Subjective:  ?Dominique Vega is a 31 y.o. female here for BP check.  ? ?Hypertension ROS: Patient denies any headaches, visual symptoms, RUQ/epigastric pain or other concerning symptoms. ? ?Objective:  ?BP 121/76   Pulse (!) 118   ?Appearance alert, well appearing, and in no distress. ?General exam BP noted to be improved today in office.  ? ? ?Assessment:   ?Blood Pressure well controlled.  ? ?Plan:  ?Follow up as needed and or at postpartum visit .  ?

## 2021-08-17 NOTE — Progress Notes (Signed)
Attestation of Attending Supervision of clinical support staff: I agree with the care provided to this patient and was available for any consultation.  I have reviewed the RN's note and chart. I was available for consult and to see the patient if needed.   Chayil Gantt MD MPH Attending Physician Faculty Practice- [facility]  

## 2021-08-18 ENCOUNTER — Ambulatory Visit: Payer: BC Managed Care – PPO

## 2021-08-19 ENCOUNTER — Other Ambulatory Visit: Payer: Self-pay | Admitting: Family Medicine

## 2021-08-19 ENCOUNTER — Encounter: Payer: BC Managed Care – PPO | Admitting: Obstetrics and Gynecology

## 2021-08-21 ENCOUNTER — Ambulatory Visit: Payer: BC Managed Care – PPO

## 2021-08-21 ENCOUNTER — Telehealth: Payer: Self-pay | Admitting: *Deleted

## 2021-08-21 ENCOUNTER — Encounter: Payer: Self-pay | Admitting: Family Medicine

## 2021-08-21 NOTE — Telephone Encounter (Signed)
Pt called stating her BP was 160/97, and was having pain in her right temple and having floaters, advised pt to go to MAU. Will make MAU provider aware.  ?

## 2021-08-25 ENCOUNTER — Ambulatory Visit: Payer: BC Managed Care – PPO

## 2021-08-26 ENCOUNTER — Encounter: Payer: BC Managed Care – PPO | Admitting: Family Medicine

## 2021-08-28 ENCOUNTER — Ambulatory Visit: Payer: BC Managed Care – PPO

## 2021-09-01 ENCOUNTER — Ambulatory Visit: Payer: BC Managed Care – PPO

## 2021-09-04 ENCOUNTER — Ambulatory Visit: Payer: BC Managed Care – PPO

## 2021-09-05 ENCOUNTER — Inpatient Hospital Stay: Admit: 2021-09-05 | Payer: Self-pay

## 2021-09-16 ENCOUNTER — Ambulatory Visit (INDEPENDENT_AMBULATORY_CARE_PROVIDER_SITE_OTHER): Payer: BC Managed Care – PPO | Admitting: Family Medicine

## 2021-09-16 VITALS — BP 122/87 | HR 93 | Ht 60.0 in | Wt 173.0 lb

## 2021-09-16 DIAGNOSIS — Z8249 Family history of ischemic heart disease and other diseases of the circulatory system: Secondary | ICD-10-CM | POA: Diagnosis not present

## 2021-09-16 DIAGNOSIS — Z8759 Personal history of other complications of pregnancy, childbirth and the puerperium: Secondary | ICD-10-CM

## 2021-09-16 MED ORDER — NIFEDIPINE ER 60 MG PO TB24: 60.0000 mg | ORAL_TABLET | Freq: Every day | ORAL | 1 refills | Status: DC

## 2021-09-16 NOTE — Progress Notes (Signed)
? ? ?Post Partum Visit Note ? ?Dominique Vega is a 31 y.o. [phone] female who presents for a postpartum visit. She is 6 weeks postpartum following a scheduled cesarean section secondary to non reassuring fetal status..  I have fully reviewed the prenatal and intrapartum course. The delivery was at [redacted]w[redacted]d gestational weeks.  Anesthesia: epidural and general. Postpartum course has been uncomplicated. Baby is doing well. Baby is feeding by breast. Bleeding no bleeding. Bowel function is normal. Bladder function is normal. Patient is not sexually active. Contraception method is none. Postpartum depression screening: negative. ? ? ?The pregnancy intention screening data noted above was reviewed. Potential methods of contraception were discussed. The patient elected to proceed with natural family planning.  ? ? Edinburgh Postnatal Depression Scale - 09/16/21 1102   ? ?  ? Edinburgh Postnatal Depression Scale:  In the Past 7 Days  ? I have been able to laugh and see the funny side of things. 0   ? I have looked forward with enjoyment to things. 0   ? I have blamed myself unnecessarily when things went wrong. 1   ? I have been anxious or worried for no good reason. 0   ? I have felt scared or panicky for no good reason. 0   ? Things have been getting on top of me. 0   ? I have been so unhappy that I have had difficulty sleeping. 0   ? I have felt sad or miserable. 0   ? I have been so unhappy that I have been crying. 0   ? The thought of harming myself has occurred to me. 0   ? Edinburgh Postnatal Depression Scale Total 1   ? ?  ?  ? ?  ? ? ?Health Maintenance Due  ?Topic Date Due  ? Hepatitis C Screening  Never done  ? COVID-19 Vaccine (4 - Booster for Pfizer series) 07/01/2020  ? ? ?The following portions of the patient's history were reviewed and updated as appropriate: allergies, current medications, past family history, past medical history, past social history, past surgical history, and problem list. ? ?Review  of Systems ?Pertinent items noted in HPI and remainder of comprehensive ROS otherwise negative. ? ?Objective:  ?BP 122/87   Pulse 93   Ht 5' (1.524 m)   Wt 173 lb (78.5 kg)   LMP  (LMP Unknown)   Breastfeeding Yes   BMI 33.79 kg/m?   ? ?General:  alert, cooperative, and appears stated age  ?Lungs: Normal effort  ?Heart:  regular rate and rhythm  ?Abdomen: soft, non-tender; bowel sounds normal; no masses,  no organomegaly   ?Wound well approximated incision  ?     ?Assessment:  ? ? There are no diagnoses linked to this encounter. ? ?Normal postpartum exam.  ? ?Plan:  ? ?Essential components of care per ACOG recommendations: ? ?1.  Mood and well being: Patient with negative depression screening today. Reviewed local resources for support.  ?- Patient tobacco use? No.   ?- hx of drug use? No.   ? ?2. Infant care and feeding:  ?-Patient currently breastmilk feeding? Yes. Reviewed importance of draining breast regularly to support lactation.  ?-Social determinants of health (SDOH) reviewed in EPIC. No concerns ? ?3. Sexuality, contraception and birth spacing ?- Patient does not want a pregnancy in the next year.  Desired family size is 2 children.  ?- Reviewed reproductive life planning. Reviewed contraceptive methods based on pt preferences and effectiveness.  Patient  desired No Method - Other Reason today.   ?- Discussed birth spacing of 18 months ? ?4. Sleep and fatigue ?-Encouraged family/partner/community support of 4 hrs of uninterrupted sleep to help with mood and fatigue ? ?5. Physical Recovery  ?- Discussed patients delivery and complications. She describes her labor as good. ?- Patient had a C-section emergent. Perineal healing reviewed. Patient expressed understanding ?- Patient has urinary incontinence? No. ?- Patient is safe to resume physical and sexual activity ? ?6.  Health Maintenance ?- HM due items addressed Yes ?- Last pap smear  ?Diagnosis  ?Date Value Ref Range Status  ?03/20/2020   Final  ? -  Negative for intraepithelial lesion or malignancy (NILM)  ? Pap smear not done at today's visit.  ?-Breast Cancer screening indicated? No.  ? ?7. Chronic Disease/Pregnancy Condition follow up: Hypertension ?See PCP for risk reduction--has strong FH of CV disease. ?- PCP follow up ? ?Reva Bores, MD ?[facility], [facility] ? ?

## 2021-09-30 ENCOUNTER — Other Ambulatory Visit: Payer: Self-pay | Admitting: Family Medicine

## 2021-09-30 DIAGNOSIS — Z8759 Personal history of other complications of pregnancy, childbirth and the puerperium: Secondary | ICD-10-CM

## 2021-10-01 DIAGNOSIS — Z419 Encounter for procedure for purposes other than remedying health state, unspecified: Secondary | ICD-10-CM | POA: Diagnosis not present

## 2021-10-03 ENCOUNTER — Other Ambulatory Visit: Payer: Self-pay | Admitting: Family Medicine

## 2021-10-03 DIAGNOSIS — O133 Gestational [pregnancy-induced] hypertension without significant proteinuria, third trimester: Secondary | ICD-10-CM

## 2021-10-31 DIAGNOSIS — Z419 Encounter for procedure for purposes other than remedying health state, unspecified: Secondary | ICD-10-CM | POA: Diagnosis not present

## 2021-11-13 DIAGNOSIS — F419 Anxiety disorder, unspecified: Secondary | ICD-10-CM | POA: Diagnosis not present

## 2021-11-13 DIAGNOSIS — I10 Essential (primary) hypertension: Secondary | ICD-10-CM | POA: Diagnosis not present

## 2021-11-23 ENCOUNTER — Encounter: Payer: Self-pay | Admitting: Family Medicine

## 2021-11-25 ENCOUNTER — Ambulatory Visit: Payer: BC Managed Care – PPO

## 2021-11-26 ENCOUNTER — Encounter: Payer: Self-pay | Admitting: Advanced Practice Midwife

## 2021-11-26 ENCOUNTER — Ambulatory Visit (INDEPENDENT_AMBULATORY_CARE_PROVIDER_SITE_OTHER): Payer: Medicaid Other | Admitting: Advanced Practice Midwife

## 2021-11-26 VITALS — BP 144/94 | HR 93

## 2021-11-26 DIAGNOSIS — O165 Unspecified maternal hypertension, complicating the puerperium: Secondary | ICD-10-CM

## 2021-11-26 DIAGNOSIS — Z98891 History of uterine scar from previous surgery: Secondary | ICD-10-CM

## 2021-11-26 NOTE — Progress Notes (Signed)
GYNECOLOGY PROBLEM CARE ENCOUNTER NOTE  History:     Dominique Vega is a 31 y.o. [phone] female here for a gynecologic problem visit.  Current complaints: two weeks ago patient noticed new onset pain and palpable lump at the site of cesarean incision.She reports new onset discomfort when bending, lifting and straining to have a bowel movement.   Denies abnormal vaginal bleeding, discharge, pelvic pain, problems with intercourse or other gynecologic concerns.    Gynecologic History No LMP recorded. Contraception: none Last Pap: 03/20/2020. Result was normal with negative HPV Last Mammogram: N/A.   Last Colonoscopy: N/A.    Obstetric History OB History  Gravida Para Term Preterm AB Living  4 1   1 3 1   SAB IAB Ectopic Multiple Live Births  3     0 1    # Outcome Date GA Lbr Len/2nd Weight Sex Delivery Anes PTL Lv  4 Preterm 07/31/21 [redacted]w[redacted]d  4 lb 9.7 oz (2.09 kg) F CS-LTranv EPI  LIV  3 SAB 02/04/20          2 SAB 11/2019          1 SAB 10/2017            Past Medical History:  Diagnosis Date   Anxiety    Headache    History of miscarriage    Irritable bowel syndrome    Ovarian cyst    Pyelonephritis 01/25/2019   UTI (urinary tract infection)     Past Surgical History:  Procedure Laterality Date   CESAREAN SECTION N/A 07/31/2021   Procedure: CESAREAN SECTION;  Surgeon: 08/02/2021, DO;  Location: [facility];  Service: Obstetrics;  Laterality: N/A;   COLONOSCOPY WITH PROPOFOL N/A 08/13/2020   Procedure: COLONOSCOPY WITH PROPOFOL;  Surgeon: 08/15/2020, MD;  Location: [facility] ENDOSCOPY;  Service: Gastroenterology;  Laterality: N/A;   ESOPHAGOGASTRODUODENOSCOPY (EGD) WITH PROPOFOL N/A 08/13/2020   Procedure: ESOPHAGOGASTRODUODENOSCOPY (EGD) WITH PROPOFOL;  Surgeon: 08/15/2020, MD;  Location: [facility] ENDOSCOPY;  Service: Gastroenterology;  Laterality: N/A;    Current Outpatient Medications on File Prior to Visit  Medication Sig Dispense Refill    acetaminophen (TYLENOL) 325 MG tablet Take 2 tablets (650 mg total) by mouth every 4 (four) hours as needed for mild pain (temperature > 101.5.). 30 tablet 0   escitalopram (LEXAPRO) 20 MG tablet Take 20 mg by mouth daily.     ibuprofen (ADVIL) 600 MG tablet Take 1 tablet (600 mg total) by mouth every 6 (six) hours. 30 tablet 0   NIFEdipine (ADALAT CC) 60 MG 24 hr tablet Take 1 tablet (60 mg total) by mouth daily. (Patient not taking: Reported on 11/26/2021) 30 tablet 1   Prenatal Vit-Fe Fumarate-FA (PNV PRENATAL PLUS MULTIVITAMIN) 27-1 MG TABS Take 1 tablet by mouth daily.     No current facility-administered medications on file prior to visit.    Allergies  Allergen Reactions   Penicillins Diarrhea and Nausea And Vomiting    Social History:  reports that she has never smoked. She has never used smokeless tobacco. She reports that she does not currently use alcohol. She reports that she does not use drugs.  Family History  Problem Relation Age of Onset   Stroke Mother    Hypertension Mother    Stroke Father    Diabetes Father     The following portions of the patient's history were reviewed and updated as appropriate: allergies, current medications, past family history, past medical history, past  social history, past surgical history and problem list.  Review of Systems Pertinent items noted in HPI and remainder of comprehensive ROS otherwise negative.  Physical Exam:  BP (!) 144/94   Pulse 93  CONSTITUTIONAL: Well-developed, well-nourished female in no acute distress.  HENT:  Normocephalic, atraumatic, External right and left ear normal.  EYES: Conjunctivae and EOM are normal. Pupils are equal, round, and reactive to light. No scleral icterus.  SKIN: Skin is warm and dry. No rash noted. Not diaphoretic. No erythema. No pallor. MUSCULOSKELETAL: Normal range of motion. No tenderness.  No cyanosis, clubbing, or edema. NEUROLOGIC: Alert and oriented to person, place, and time.   PSYCHIATRIC: Normal mood and affect. Normal behavior. Normal judgment and thought content. CARDIOVASCULAR: Normal heart rate noted, regular rhythm RESPIRATORY: Normal work of breathing ABDOMEN: Soft, no distention noted.  No tenderness, rebound or guarding. Incision is very well healed, well approximated. Negligible palpable scar tissue PELVIC: Not indicated   Assessment and Plan:    1. S/P emergency C-section - Emergency cesarean birth indicated for NRFHT on 07/31/2021 - Incision healed, no atypical findings on exam - Advised gentle massage with coconut oil or favorite hypoallergenic body lotion  2. Hx Postpartum hypertension, hx Severe Preeclampsia - Not elevated at postpartum BP check - Not elevated at postpartum visit - Elevated in office today, has PCP for follow-up  Routine preventative health maintenance measures emphasized. Please refer to After Visit Summary for other counseling recommendations.     RTC 2024 for annual well person or at her convenience for initiation of contraception  Clayton Bibles, MSA, MSN, CNM Certified Nurse Midwife, [employer] for [employer], [facility]

## 2021-11-26 NOTE — Progress Notes (Signed)
Bump in incision are, noticed it about a few weeks ago

## 2021-11-30 DIAGNOSIS — I10 Essential (primary) hypertension: Secondary | ICD-10-CM | POA: Diagnosis not present

## 2021-12-01 DIAGNOSIS — Z419 Encounter for procedure for purposes other than remedying health state, unspecified: Secondary | ICD-10-CM | POA: Diagnosis not present

## 2021-12-03 DIAGNOSIS — R519 Headache, unspecified: Secondary | ICD-10-CM | POA: Diagnosis not present

## 2021-12-03 DIAGNOSIS — Z20822 Contact with and (suspected) exposure to covid-19: Secondary | ICD-10-CM | POA: Diagnosis not present

## 2021-12-03 DIAGNOSIS — M791 Myalgia, unspecified site: Secondary | ICD-10-CM | POA: Diagnosis not present

## 2021-12-03 DIAGNOSIS — R1110 Vomiting, unspecified: Secondary | ICD-10-CM | POA: Diagnosis not present

## 2022-01-01 DIAGNOSIS — Z419 Encounter for procedure for purposes other than remedying health state, unspecified: Secondary | ICD-10-CM | POA: Diagnosis not present

## 2022-01-05 DIAGNOSIS — F419 Anxiety disorder, unspecified: Secondary | ICD-10-CM | POA: Diagnosis not present

## 2022-01-05 DIAGNOSIS — Z6830 Body mass index (BMI) 30.0-30.9, adult: Secondary | ICD-10-CM | POA: Diagnosis not present

## 2022-01-05 DIAGNOSIS — I10 Essential (primary) hypertension: Secondary | ICD-10-CM | POA: Diagnosis not present

## 2022-01-05 DIAGNOSIS — J020 Streptococcal pharyngitis: Secondary | ICD-10-CM | POA: Diagnosis not present

## 2022-01-05 DIAGNOSIS — Z20822 Contact with and (suspected) exposure to covid-19: Secondary | ICD-10-CM | POA: Diagnosis not present

## 2022-01-05 DIAGNOSIS — J029 Acute pharyngitis, unspecified: Secondary | ICD-10-CM | POA: Diagnosis not present

## 2022-01-07 ENCOUNTER — Ambulatory Visit: Payer: Self-pay | Admitting: Advanced Practice Midwife

## 2022-01-31 DIAGNOSIS — Z419 Encounter for procedure for purposes other than remedying health state, unspecified: Secondary | ICD-10-CM | POA: Diagnosis not present

## 2022-02-18 DIAGNOSIS — I10 Essential (primary) hypertension: Secondary | ICD-10-CM | POA: Diagnosis not present

## 2022-03-03 DIAGNOSIS — Z419 Encounter for procedure for purposes other than remedying health state, unspecified: Secondary | ICD-10-CM | POA: Diagnosis not present

## 2022-04-02 DIAGNOSIS — Z419 Encounter for procedure for purposes other than remedying health state, unspecified: Secondary | ICD-10-CM | POA: Diagnosis not present

## 2022-05-03 DIAGNOSIS — Z419 Encounter for procedure for purposes other than remedying health state, unspecified: Secondary | ICD-10-CM | POA: Diagnosis not present

## 2022-06-03 DIAGNOSIS — Z419 Encounter for procedure for purposes other than remedying health state, unspecified: Secondary | ICD-10-CM | POA: Diagnosis not present

## 2022-06-05 DIAGNOSIS — I10 Essential (primary) hypertension: Secondary | ICD-10-CM | POA: Diagnosis not present

## 2022-06-05 DIAGNOSIS — R110 Nausea: Secondary | ICD-10-CM | POA: Diagnosis not present

## 2022-06-05 DIAGNOSIS — F419 Anxiety disorder, unspecified: Secondary | ICD-10-CM | POA: Diagnosis not present

## 2022-06-05 DIAGNOSIS — Z6830 Body mass index (BMI) 30.0-30.9, adult: Secondary | ICD-10-CM | POA: Diagnosis not present

## 2022-06-05 DIAGNOSIS — R42 Dizziness and giddiness: Secondary | ICD-10-CM | POA: Diagnosis not present

## 2022-06-07 DIAGNOSIS — H6593 Unspecified nonsuppurative otitis media, bilateral: Secondary | ICD-10-CM | POA: Diagnosis not present

## 2022-06-07 DIAGNOSIS — H8113 Benign paroxysmal vertigo, bilateral: Secondary | ICD-10-CM | POA: Diagnosis not present

## 2022-07-02 DIAGNOSIS — Z419 Encounter for procedure for purposes other than remedying health state, unspecified: Secondary | ICD-10-CM | POA: Diagnosis not present

## 2022-07-12 DIAGNOSIS — Z6835 Body mass index (BMI) 35.0-35.9, adult: Secondary | ICD-10-CM | POA: Diagnosis not present

## 2022-07-12 DIAGNOSIS — F419 Anxiety disorder, unspecified: Secondary | ICD-10-CM | POA: Diagnosis not present

## 2022-07-12 DIAGNOSIS — Z20822 Contact with and (suspected) exposure to covid-19: Secondary | ICD-10-CM | POA: Diagnosis not present

## 2022-07-12 DIAGNOSIS — H66003 Acute suppurative otitis media without spontaneous rupture of ear drum, bilateral: Secondary | ICD-10-CM | POA: Diagnosis not present

## 2022-07-12 DIAGNOSIS — J069 Acute upper respiratory infection, unspecified: Secondary | ICD-10-CM | POA: Diagnosis not present

## 2022-07-12 DIAGNOSIS — J019 Acute sinusitis, unspecified: Secondary | ICD-10-CM | POA: Diagnosis not present

## 2022-07-12 DIAGNOSIS — I10 Essential (primary) hypertension: Secondary | ICD-10-CM | POA: Diagnosis not present

## 2022-08-02 DIAGNOSIS — Z419 Encounter for procedure for purposes other than remedying health state, unspecified: Secondary | ICD-10-CM | POA: Diagnosis not present

## 2022-08-20 DIAGNOSIS — F411 Generalized anxiety disorder: Secondary | ICD-10-CM | POA: Diagnosis not present

## 2022-08-20 DIAGNOSIS — F332 Major depressive disorder, recurrent severe without psychotic features: Secondary | ICD-10-CM | POA: Diagnosis not present

## 2022-08-23 ENCOUNTER — Ambulatory Visit (INDEPENDENT_AMBULATORY_CARE_PROVIDER_SITE_OTHER): Payer: Medicaid Other | Admitting: Family Medicine

## 2022-08-23 ENCOUNTER — Encounter: Payer: Self-pay | Admitting: Family Medicine

## 2022-08-23 VITALS — BP 108/72 | HR 77 | Temp 97.9°F | Ht 60.0 in | Wt 178.0 lb

## 2022-08-23 DIAGNOSIS — I10 Essential (primary) hypertension: Secondary | ICD-10-CM | POA: Diagnosis not present

## 2022-08-23 DIAGNOSIS — G43909 Migraine, unspecified, not intractable, without status migrainosus: Secondary | ICD-10-CM | POA: Diagnosis not present

## 2022-08-23 DIAGNOSIS — Z7689 Persons encountering health services in other specified circumstances: Secondary | ICD-10-CM | POA: Diagnosis not present

## 2022-08-23 DIAGNOSIS — E6609 Other obesity due to excess calories: Secondary | ICD-10-CM | POA: Diagnosis not present

## 2022-08-23 DIAGNOSIS — D649 Anemia, unspecified: Secondary | ICD-10-CM | POA: Diagnosis not present

## 2022-08-23 DIAGNOSIS — F419 Anxiety disorder, unspecified: Secondary | ICD-10-CM

## 2022-08-23 NOTE — Assessment & Plan Note (Signed)
Discuss medical weight loss options at next visit.  Consider referral to weight mgmt clinic.   - f/u A1c, lipid

## 2022-08-23 NOTE — Assessment & Plan Note (Signed)
Takes sumatriptan prn

## 2022-08-23 NOTE — Assessment & Plan Note (Signed)
Takes enalapril.  Goal < 130/85.

## 2022-08-23 NOTE — Patient Instructions (Signed)
It was nice to meet you today,   I will get some labs in regards to your weight. You will need to come back in the morning to have them drawn.   I will let you know the results when I get them.   Have a great day,   Dr. Constance Goltz

## 2022-08-23 NOTE — Progress Notes (Signed)
New Patient Office Visit  Subjective    Patient ID: Dominique Vega, female    DOB: 07/26/1990  Age: 32 y.o. MRN: [id number]  CC:  Chief Complaint  Patient presents with   Establish Care    HPI [name] presents to establish care  Was previously at [facility] in [place], [place].  Last saw them 6 months ago.    HTN - prescribed Enalapril .  Still taking it.  Has been having 'higher readings' in the 120s / 80-90s.  Checks her bp usually at night. Discussed with pt having goal of < 130/85 and reassured her that her current readings are within normal limits  Anxiety - She was previously on lexapro, but recently met with psychiatrist on Friday and is in the process of switching to zoloft.    Weight - Wants to treat her weight.  Has been trying to eat healthier and exercise more.  Pgm, mgm, dad all had diabetes.  Eating more vegetables recently.  Pt aware what she needs to do to eat healthier and lose weight, just needs to apply it.    Has a hx of migraines for which she takes sumatriptan.   Sees [facility].    PMH: htn, anxiety,   PSH: c-section 3/23.    FH: dm - father, both grandmothers  Tobacco use: none Alcohol use: 1-2 x year . Drug use: none.  Marital status: married. 1 girl.    Employment: 10 hrs/ week.  Works for [employer].   Sexual hx: yes.  Tracking ovulation.  Regular menstrual cycles.     Outpatient Encounter Medications as of 08/23/2022  Medication Sig   acetaminophen (TYLENOL) 325 MG tablet Take 2 tablets (650 mg total) by mouth every 4 (four) hours as needed for mild pain (temperature > 101.5.).   escitalopram (LEXAPRO) 20 MG tablet Take 20 mg by mouth daily.   ibuprofen (ADVIL) 600 MG tablet Take 1 tablet (600 mg total) by mouth every 6 (six) hours.   NIFEdipine (ADALAT CC) 60 MG 24 hr tablet Take 1 tablet (60 mg total) by mouth daily. (Patient not taking: Reported on 11/26/2021)   Prenatal Vit-Fe Fumarate-FA (PNV PRENATAL  PLUS MULTIVITAMIN) 27-1 MG TABS Take 1 tablet by mouth daily.   No facility-administered encounter medications on file as of 08/23/2022.    Past Medical History:  Diagnosis Date   Anxiety    Headache    History of miscarriage    History of pre-eclampsia 07/29/2021   History of prior pregnancy with IUGR newborn 07/07/2021   Irritable bowel syndrome    Ovarian cyst    Pyelonephritis 01/25/2019   UTI (urinary tract infection)     Past Surgical History:  Procedure Laterality Date   CESAREAN SECTION N/A 07/31/2021   Procedure: CESAREAN SECTION;  Surgeon: Myna Hidalgo, DO;  Location: [facility];  Service: Obstetrics;  Laterality: N/A;   COLONOSCOPY WITH PROPOFOL N/A 08/13/2020   Procedure: COLONOSCOPY WITH PROPOFOL;  Surgeon: [name], MD;  Location: [facility] ENDOSCOPY;  Service: Gastroenterology;  Laterality: N/A;   ESOPHAGOGASTRODUODENOSCOPY (EGD) WITH PROPOFOL N/A 08/13/2020   Procedure: ESOPHAGOGASTRODUODENOSCOPY (EGD) WITH PROPOFOL;  Surgeon: [name], MD;  Location: [facility] ENDOSCOPY;  Service: Gastroenterology;  Laterality: N/A;    Family History  Problem Relation Age of Onset   Stroke Mother    Hypertension Mother    Stroke Father    Diabetes Father     Social History   Socioeconomic History  Marital status: Married    Spouse name: Ceanna Wareing   Number of children: Not on file   Years of education: Not on file   Highest education level: Not on file  Occupational History   Not on file  Tobacco Use   Smoking status: Never   Smokeless tobacco: Never  Vaping Use   Vaping Use: Never used  Substance and Sexual Activity   Alcohol use: Not Currently   Drug use: Never   Sexual activity: Not Currently    Birth control/protection: None  Other Topics Concern   Not on file  Social History Narrative   Not on file   Social Determinants of Health   Financial Resource Strain: Low Risk  (08/23/2022)   Overall Financial Resource Strain (CARDIA)     Difficulty of Paying Living Expenses: Not very hard  Food Insecurity: No Food Insecurity (08/23/2022)   Hunger Vital Sign    Worried About Running Out of Food in the Last Year: Never true    Ran Out of Food in the Last Year: Never true  Transportation Needs: No Transportation Needs (07/30/2021)   PRAPARE - Administrator, Civil Service (Medical): No    Lack of Transportation (Non-Medical): No  Physical Activity: Insufficiently Active (08/23/2022)   Exercise Vital Sign    Days of Exercise per Week: 1 day    Minutes of Exercise per Session: 30 min  Stress: No Stress Concern Present (08/23/2022)   [employer] of Occupational Health - Occupational Stress Questionnaire    Feeling of Stress : Only a little  Social Connections: Socially Integrated (08/23/2022)   Social Connection and Isolation Panel [NHANES]    Frequency of Communication with Friends and Family: More than three times a week    Frequency of Social Gatherings with Friends and Family: More than three times a week    Attends Religious Services: More than 4 times per year    Active Member of [employer] or Organizations: Yes    Attends Engineer, structural: More than 4 times per year    Marital Status: Married  Catering manager Violence: Not At Risk (07/30/2021)   Humiliation, Afraid, Rape, and Kick questionnaire    Fear of Current or Ex-Partner: No    Emotionally Abused: No    Physically Abused: No    Sexually Abused: No    ROS      Objective    BP 108/72   Pulse 77   Temp 97.9 F (36.6 C) (Oral)   Ht 5' (1.524 m)   Wt 178 lb 0.6 oz (80.8 kg)   LMP 08/18/2022 (Exact Date)   SpO2 97%   Breastfeeding Yes Comment: slowing cutting back  BMI 34.77 kg/m   Physical Exam Gen: alert, oriented Heent: perrla, eomi Cv: rrr Pulm: lctab Gi: soft, nontender. Nbs Ext: no pedal edema.  Normal strength b/l Psych: pleasant affect.       Assessment & Plan:   Problem List Items Addressed This Visit        Cardiovascular and Mediastinum   HTN (hypertension)    Takes enalapril.  Goal < 130/85.        Migraines    Takes sumatriptan prn        Other   Anemia   Relevant Orders   CBC   Anxiety   Class 1 obesity due to excess calories with body mass index (BMI) of 34.0 to 34.9 in adult    Discuss medical weight loss options at  next visit.  Consider referral to weight mgmt clinic.   - f/u A1c, lipid      Relevant Orders   HgB A1c   Lipid Profile   TSH   Other Visit Diagnoses     Encounter to establish care    -  Primary       Return in about 2 months (around 10/23/2022) for weight loss.   Sandre Kitty, MD

## 2022-08-24 ENCOUNTER — Encounter: Payer: Self-pay | Admitting: Family Medicine

## 2022-08-24 DIAGNOSIS — H8110 Benign paroxysmal vertigo, unspecified ear: Secondary | ICD-10-CM | POA: Insufficient documentation

## 2022-08-25 ENCOUNTER — Encounter: Payer: Self-pay | Admitting: Family Medicine

## 2022-08-30 ENCOUNTER — Other Ambulatory Visit: Payer: Medicaid Other

## 2022-08-30 DIAGNOSIS — Z6834 Body mass index (BMI) 34.0-34.9, adult: Secondary | ICD-10-CM | POA: Diagnosis not present

## 2022-08-30 DIAGNOSIS — D649 Anemia, unspecified: Secondary | ICD-10-CM | POA: Diagnosis not present

## 2022-08-30 DIAGNOSIS — E6609 Other obesity due to excess calories: Secondary | ICD-10-CM | POA: Diagnosis not present

## 2022-08-31 LAB — LIPID PANEL
Chol/HDL Ratio: 3.9 ratio (ref 0.0–4.4)
Cholesterol, Total: 205 mg/dL — ABNORMAL HIGH (ref 100–199)
HDL: 52 mg/dL (ref >39)
LDL Chol Calc (NIH): 135 mg/dL — ABNORMAL HIGH (ref 0–99)
Triglycerides: 98 mg/dL (ref 0–149)
VLDL Cholesterol Cal: 18 mg/dL (ref 5–40)

## 2022-08-31 LAB — CBC
Hematocrit: 39.2 % (ref 34.0–46.6)
Hemoglobin: 12.9 g/dL (ref 11.1–15.9)
MCH: 28.9 pg (ref 26.6–33.0)
MCHC: 32.9 g/dL (ref 31.5–35.7)
MCV: 88 fL (ref 79–97)
Platelets: 405 10*3/uL (ref 150–450)
RBC: 4.47 x10E6/uL (ref 3.77–5.28)
RDW: 13.4 % (ref 11.7–15.4)
WBC: 9.1 10*3/uL (ref 3.4–10.8)

## 2022-08-31 LAB — TSH: TSH: 1.97 u[IU]/mL (ref 0.450–4.500)

## 2022-08-31 LAB — HEMOGLOBIN A1C
Est. average glucose Bld gHb Est-mCnc: 108 mg/dL
Hgb A1c MFr Bld: 5.4 % (ref 4.8–5.6)

## 2022-09-01 DIAGNOSIS — Z419 Encounter for procedure for purposes other than remedying health state, unspecified: Secondary | ICD-10-CM | POA: Diagnosis not present

## 2022-09-02 ENCOUNTER — Encounter: Payer: Self-pay | Admitting: Family Medicine

## 2022-09-09 DIAGNOSIS — F332 Major depressive disorder, recurrent severe without psychotic features: Secondary | ICD-10-CM | POA: Diagnosis not present

## 2022-09-09 DIAGNOSIS — F411 Generalized anxiety disorder: Secondary | ICD-10-CM | POA: Diagnosis not present

## 2022-09-16 DIAGNOSIS — F411 Generalized anxiety disorder: Secondary | ICD-10-CM | POA: Diagnosis not present

## 2022-09-16 DIAGNOSIS — F332 Major depressive disorder, recurrent severe without psychotic features: Secondary | ICD-10-CM | POA: Diagnosis not present

## 2022-09-21 IMAGING — CT CT HEAD W/O CM
1 of 2 series · 12 of 30 positions shown, 15 images · non-contrast
Comparison: None.

CLINICAL DATA: Head trauma, abnormal mental status (Age 18-64y).
Syncope.



[Series 5: head 2.0 mpr ax · axial · 0.34mm/px · z∈[-649,-508]mm · 12 of 79 slices shown, 15 images]
[im 4/79  brain]
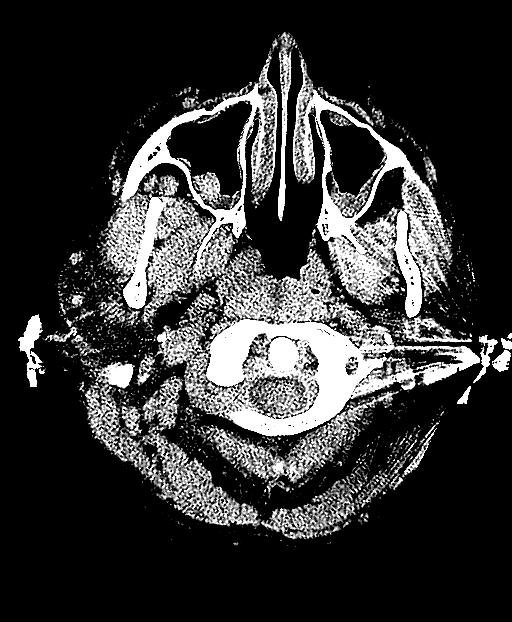
[im 4/79  bone]
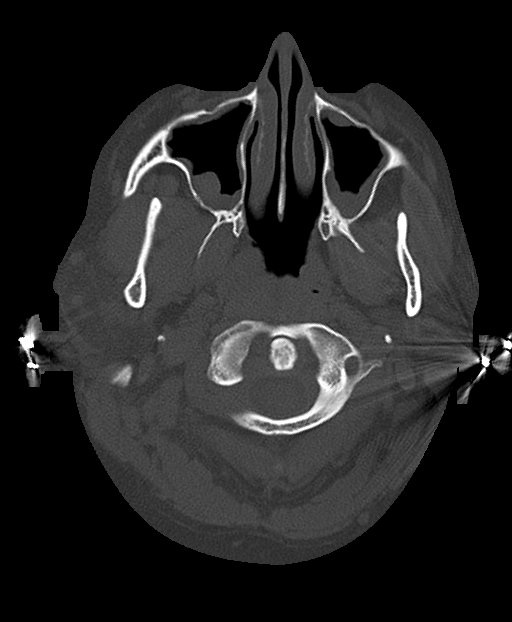
[im 12/79  brain]
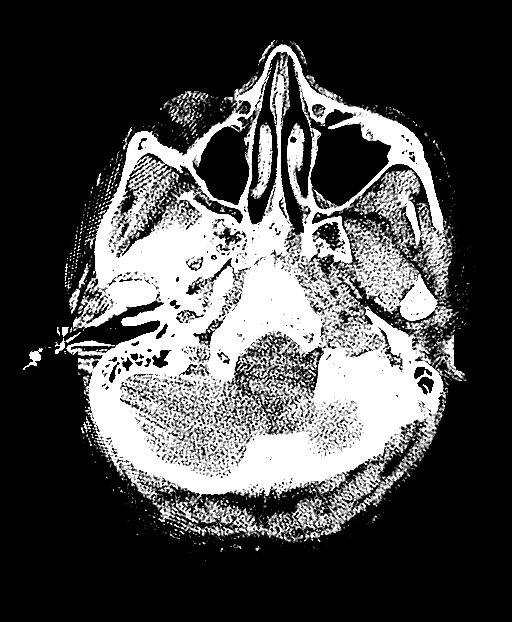
[im 16/79  brain]
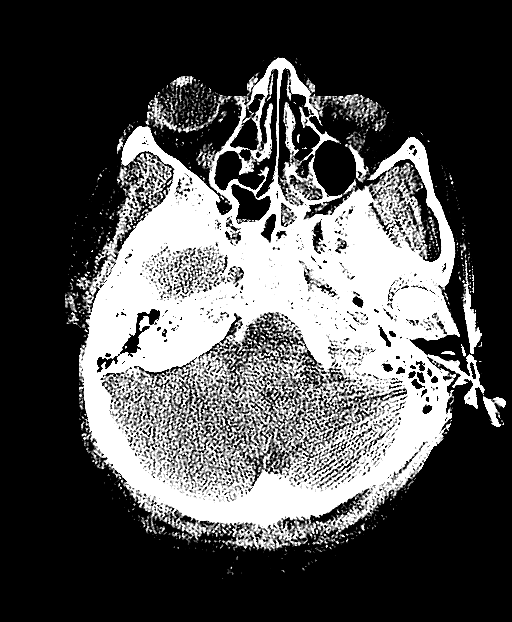
[im 24/79  brain]
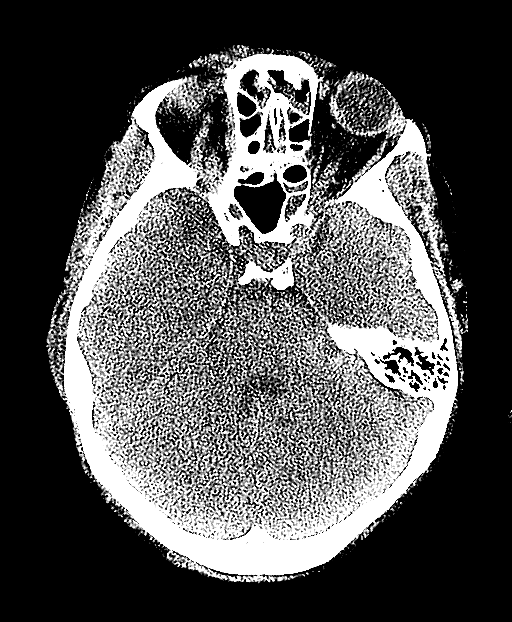
[im 32/79  brain]
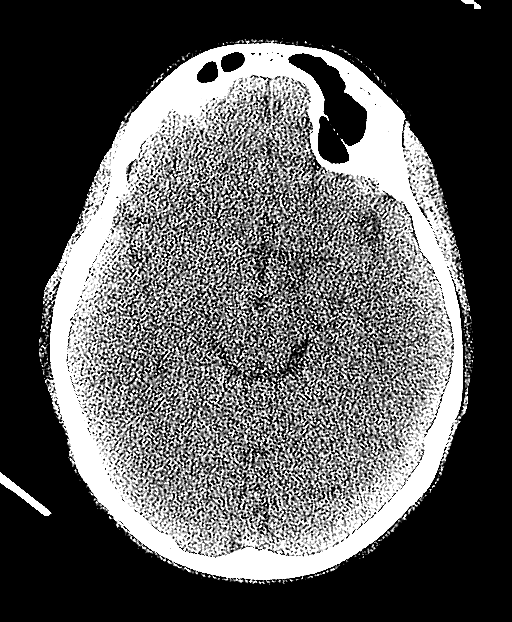
[im 32/79  bone]
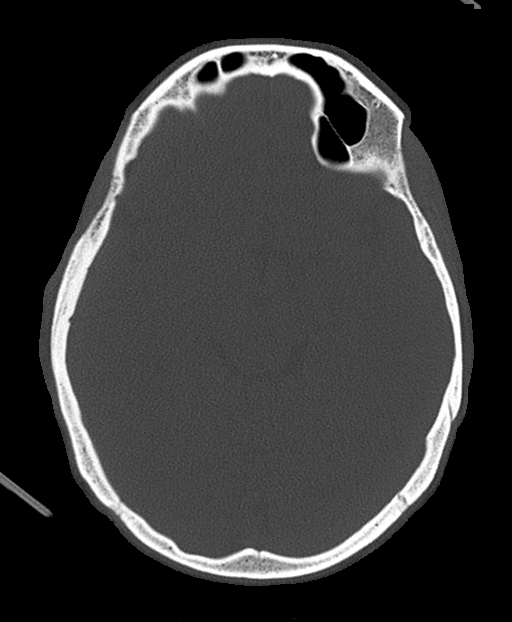
[im 36/79  brain]
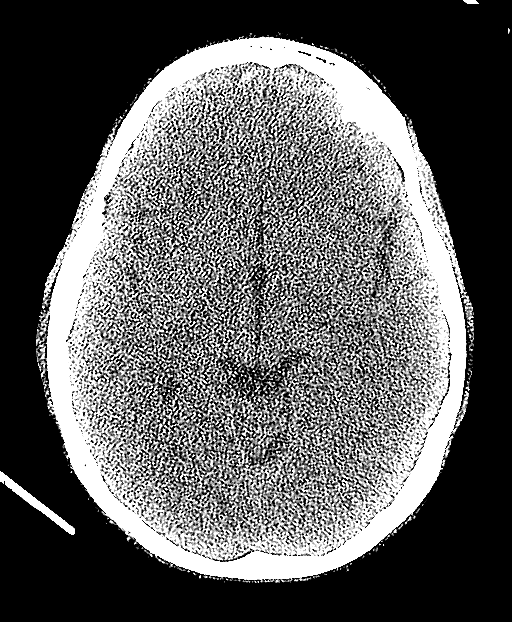
[im 43/79  brain]
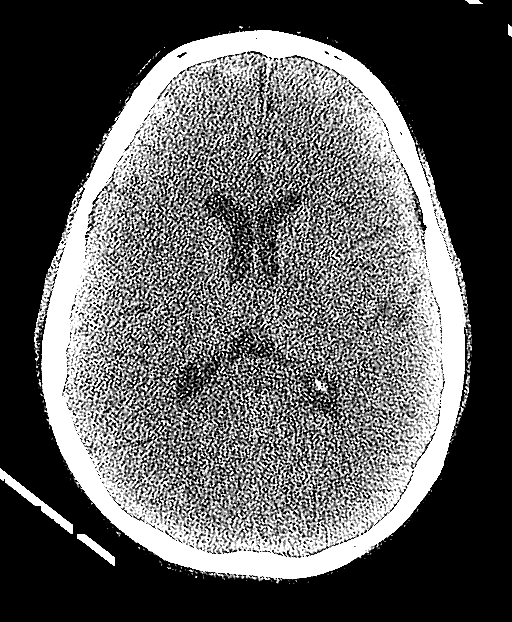
[im 47/79  brain]
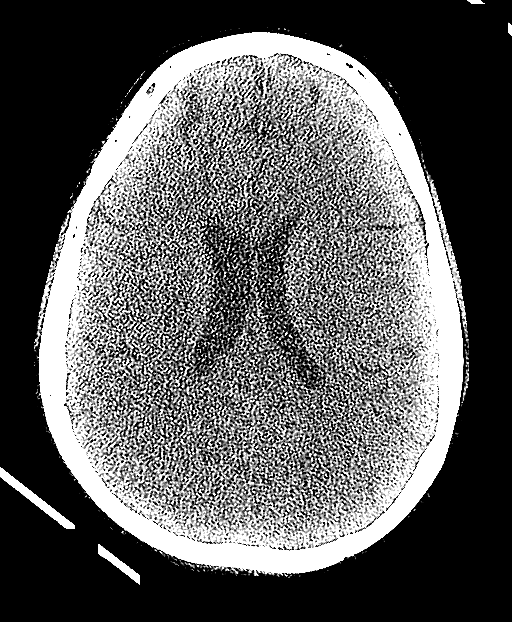
[im 55/79  brain]
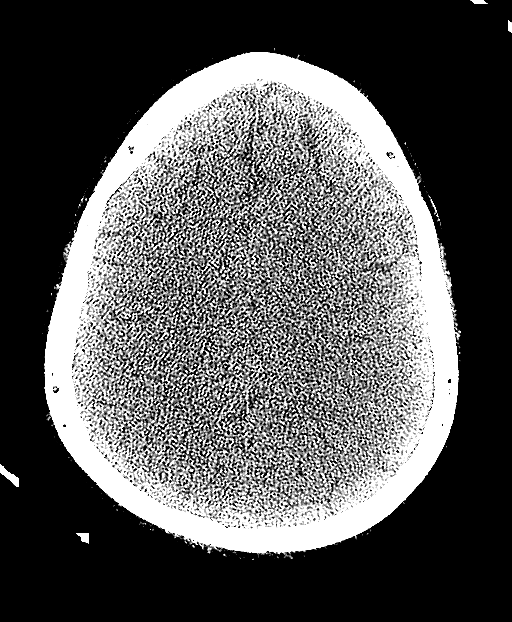
[im 55/79  bone]
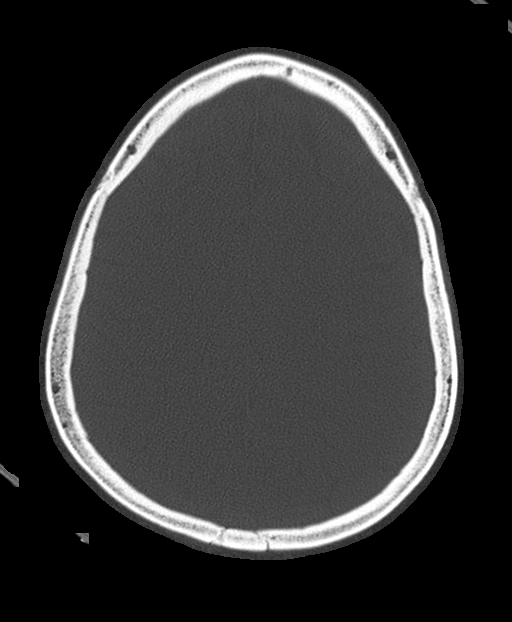
[im 63/79  brain]
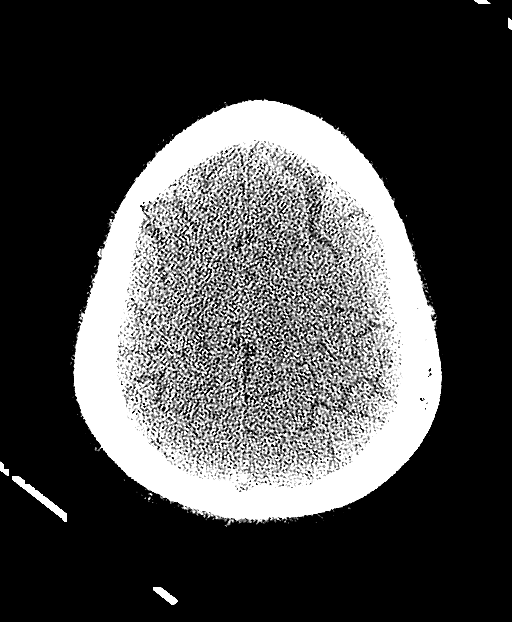
[im 67/79  brain]
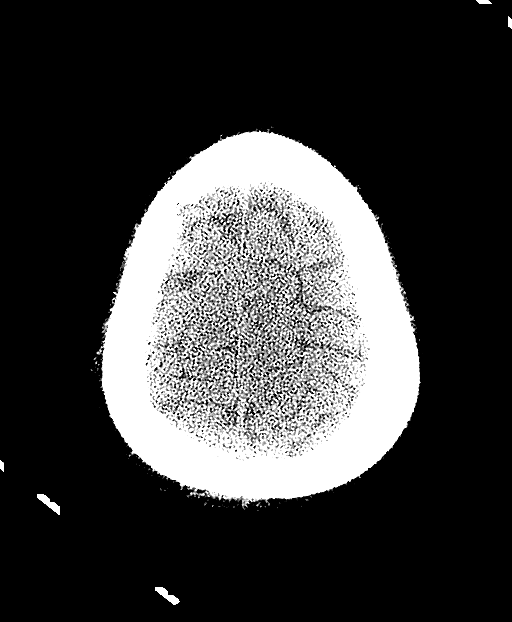
[im 75/79  brain]
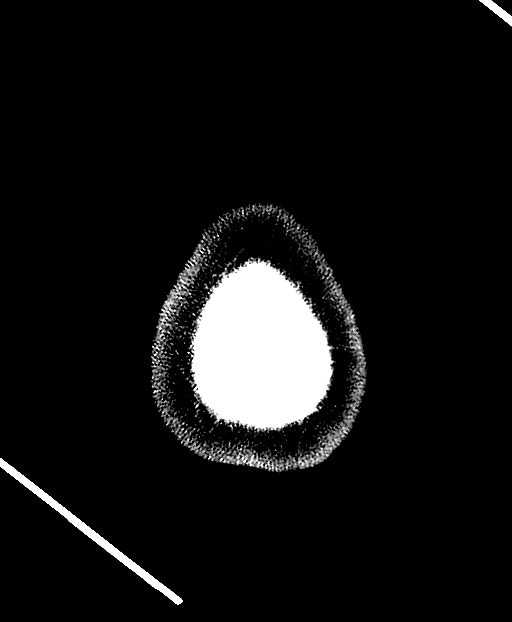

[12 of 30 positions shown; findings below may reference images not displayed]

FINDINGS: Brain: No acute intracranial abnormality. Specifically, no
hemorrhage, hydrocephalus, mass lesion, acute infarction, or
significant intracranial injury.

Vascular: No hyperdense vessel or unexpected calcification.

Skull: No acute calvarial abnormality.

Sinuses/Orbits: Mucosal thickening throughout the paranasal sinuses.
No air-fluid levels.

Other: None
IMPRESSION: No acute intracranial abnormality.

Chronic sinusitis.

## 2022-09-22 ENCOUNTER — Encounter: Payer: Self-pay | Admitting: Family Medicine

## 2022-09-22 ENCOUNTER — Other Ambulatory Visit: Payer: Self-pay | Admitting: Family Medicine

## 2022-09-22 ENCOUNTER — Ambulatory Visit: Payer: Medicaid Other | Admitting: Family Medicine

## 2022-09-22 MED ORDER — ENALAPRIL MALEATE 5 MG PO TABS: 5.0000 mg | ORAL_TABLET | Freq: Every day | ORAL | 1 refills | Status: DC

## 2022-09-22 NOTE — Progress Notes (Deleted)
   Established Patient Office Visit  Subjective   Patient ID: Krystol Monjaras, female    DOB: 1990/12/11  Age: 32 y.o. MRN: [id number]  No chief complaint on file.   HPI  {History (Optional):23778}  ROS    Objective:     LMP 08/18/2022 (Exact Date)  {Vitals History (Optional):23777}  Physical Exam   No results found for any visits on 09/22/22.  {Labs (Optional):23779}  The ASCVD Risk score (Arnett DK, et al., 2019) failed to calculate for the following reasons:   The 2019 ASCVD risk score is only valid for ages 55 to 79    Assessment & Plan:   Problem List Items Addressed This Visit   None   No follow-ups on file.    Sandre Kitty, MD

## 2022-09-23 ENCOUNTER — Encounter: Payer: Self-pay | Admitting: Family Medicine

## 2022-09-23 ENCOUNTER — Ambulatory Visit (INDEPENDENT_AMBULATORY_CARE_PROVIDER_SITE_OTHER): Payer: Medicaid Other | Admitting: Family Medicine

## 2022-09-23 VITALS — BP 102/68 | HR 80 | Temp 98.1°F | Ht 60.0 in | Wt 179.8 lb

## 2022-09-23 DIAGNOSIS — E6609 Other obesity due to excess calories: Secondary | ICD-10-CM

## 2022-09-23 DIAGNOSIS — Z6834 Body mass index (BMI) 34.0-34.9, adult: Secondary | ICD-10-CM

## 2022-09-23 DIAGNOSIS — I10 Essential (primary) hypertension: Secondary | ICD-10-CM

## 2022-09-23 DIAGNOSIS — E782 Mixed hyperlipidemia: Secondary | ICD-10-CM

## 2022-09-23 DIAGNOSIS — E785 Hyperlipidemia, unspecified: Secondary | ICD-10-CM | POA: Insufficient documentation

## 2022-09-23 NOTE — Assessment & Plan Note (Signed)
Discussed diet low in saturated fats.  Discussed weight loss, physical activity.  Can recheck at next visit in 6 months

## 2022-09-23 NOTE — Assessment & Plan Note (Signed)
Counseled patient on weight loss and healthy eating habits.  Patient qualify based on labs ordered diagnoses to have GLP-1's covered by her insurance.  Discussed telehealth options and recommended patient look into [employer] telehealth website for obesity management

## 2022-09-23 NOTE — Assessment & Plan Note (Signed)
Compliant.  Blood pressure 102/68 today.  Continue current med

## 2022-09-23 NOTE — Patient Instructions (Addendum)
It was nice to see you today,  If you would still like to pursue the GLP-1 medications for weight loss, I would check out the website from the manufacturer [employer].  They have a telehealth service and can provide the medication that way.  I do not know the cost of it.  [email or web address]  In the meantime, things you can do to help with weight loss include: - Limiting snacks in between meals.  Choosing healthy snacks such as a fresh fruit or vegetable.  Making sure you count out the serving size for snacks and meals to make sure you do not eat more than the recommended serving size. - Exercising 20 to 30 minutes a day.  This could include things such as walking.  It does not have to be high intensity. - Generally avoid excess sugar and saturated fat.  This will help your cholesterol levels.  I would like to see you back in 6 months   Have a great day,   Frederic Jericho, MD

## 2022-09-23 NOTE — Progress Notes (Signed)
   Established Patient Office Visit  Subjective   Patient ID: Dominique Vega, female    DOB: 12-Mar-1991  Age: 32 y.o. MRN: [id number]  Chief Complaint  Patient presents with   Discuss Weight Loss    HPI  Patient here to discuss follow-up of her previous issues.  Hypertension-patient compliant with her blood pressure medication blood pressure good today.  Weight loss-patient states that she looked into a couple different online health services that offer weight loss medications and has decided to hold off on pursuing that at this time.  Is in a activity class with her infant.  Is also monitoring what she eats and has stopped drinking beverages with sugar in them.  We discussed her previous labs.  We discussed healthy eating tips including limiting snack, having 2-3 meals per day, monitoring portion sizes, avoiding nonnutritive foods.  Discussed how breast-feeding can affect weight changes in appetite.  Patient plans to wean off breast-feeding in the coming weeks.     ROS    Objective:     BP 102/68   Pulse 80   Temp 98.1 F (36.7 C) (Oral)   Ht 5' (1.524 m)   Wt 179 lb 12 oz (81.5 kg)   LMP 09/16/2022 (Exact Date)   SpO2 99%   BMI 35.11 kg/m    Physical Exam General: Alert, oriented.  Elderly female Pulmonary: No respiratory distress Psych: Pleasant affect.  No results found for any visits on 09/23/22.    The ASCVD Risk score (Arnett DK, et al., 2019) failed to calculate for the following reasons:   The 2019 ASCVD risk score is only valid for ages 62 to 60    Assessment & Plan:   Problem List Items Addressed This Visit       Cardiovascular and Mediastinum   HTN (hypertension) - Primary    Compliant.  Blood pressure 102/68 today.  Continue current med        Other   Class 1 obesity due to excess calories with body mass index (BMI) of 34.0 to 34.9 in adult    Counseled patient on weight loss and healthy eating habits.  Patient qualify based on labs  ordered diagnoses to have GLP-1's covered by her insurance.  Discussed telehealth options and recommended patient look into [employer] telehealth website for obesity management      HLD (hyperlipidemia)    Discussed diet low in saturated fats.  Discussed weight loss, physical activity.  Can recheck at next visit in 6 months       Return in about 6 months (around 03/26/2023) for HTN, weight.    Sandre Kitty, MD

## 2022-09-28 DIAGNOSIS — F332 Major depressive disorder, recurrent severe without psychotic features: Secondary | ICD-10-CM | POA: Diagnosis not present

## 2022-09-28 DIAGNOSIS — F411 Generalized anxiety disorder: Secondary | ICD-10-CM | POA: Diagnosis not present

## 2022-10-02 DIAGNOSIS — Z419 Encounter for procedure for purposes other than remedying health state, unspecified: Secondary | ICD-10-CM | POA: Diagnosis not present

## 2022-10-20 ENCOUNTER — Encounter: Payer: Self-pay | Admitting: Family Medicine

## 2022-10-20 DIAGNOSIS — F332 Major depressive disorder, recurrent severe without psychotic features: Secondary | ICD-10-CM | POA: Diagnosis not present

## 2022-10-20 DIAGNOSIS — F411 Generalized anxiety disorder: Secondary | ICD-10-CM | POA: Diagnosis not present

## 2022-10-20 DIAGNOSIS — I10 Essential (primary) hypertension: Secondary | ICD-10-CM

## 2022-10-20 MED ORDER — ENALAPRIL MALEATE 5 MG PO TABS: 5.0000 mg | ORAL_TABLET | Freq: Every day | ORAL | 1 refills | Status: DC

## 2022-11-01 DIAGNOSIS — Z419 Encounter for procedure for purposes other than remedying health state, unspecified: Secondary | ICD-10-CM | POA: Diagnosis not present

## 2022-11-10 DIAGNOSIS — F332 Major depressive disorder, recurrent severe without psychotic features: Secondary | ICD-10-CM | POA: Diagnosis not present

## 2022-11-10 DIAGNOSIS — F411 Generalized anxiety disorder: Secondary | ICD-10-CM | POA: Diagnosis not present

## 2022-11-17 DIAGNOSIS — F411 Generalized anxiety disorder: Secondary | ICD-10-CM | POA: Diagnosis not present

## 2022-11-17 DIAGNOSIS — G4709 Other insomnia: Secondary | ICD-10-CM | POA: Diagnosis not present

## 2022-11-17 DIAGNOSIS — F332 Major depressive disorder, recurrent severe without psychotic features: Secondary | ICD-10-CM | POA: Diagnosis not present

## 2022-11-19 IMAGING — US US MFM UA CORD DOPPLER
1 series · 14 of 28 positions shown · non-contrast
Comparison: none

[Series 1: us mfm ua cord doppler · 14 of 30 slices shown]
[im 2/30]
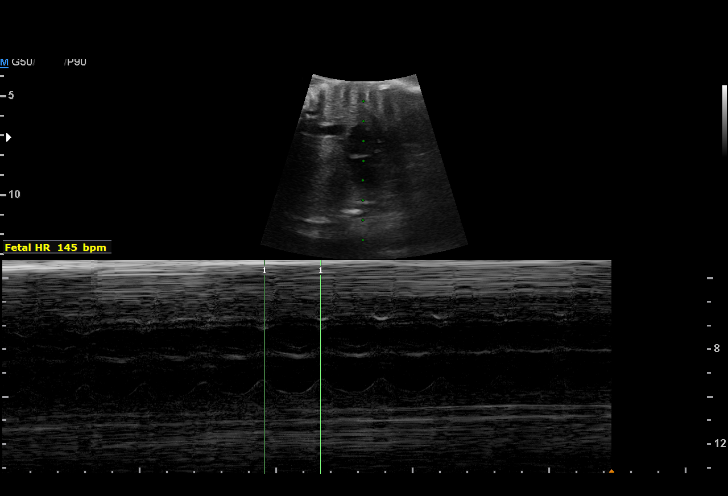
[im 4/30]
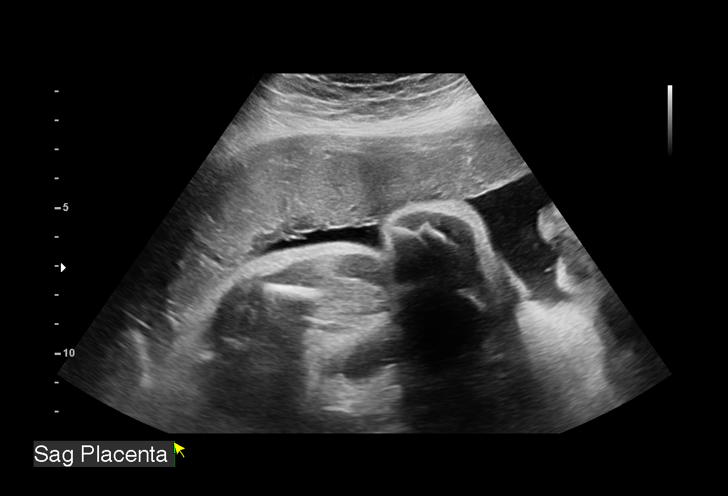
[im 6/30]
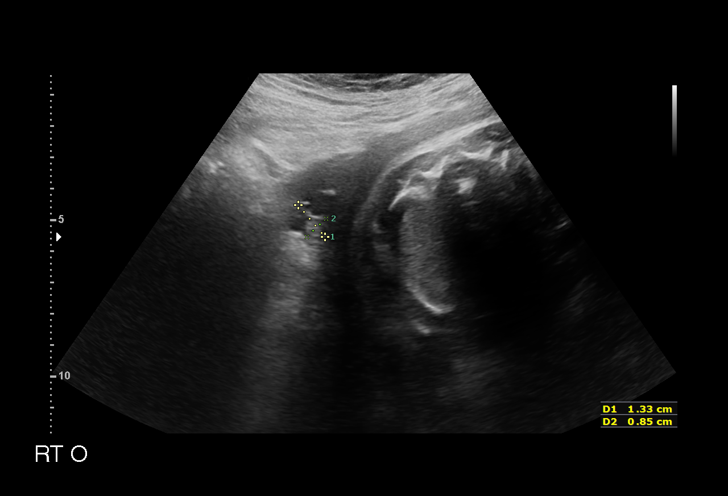
[im 8/30]
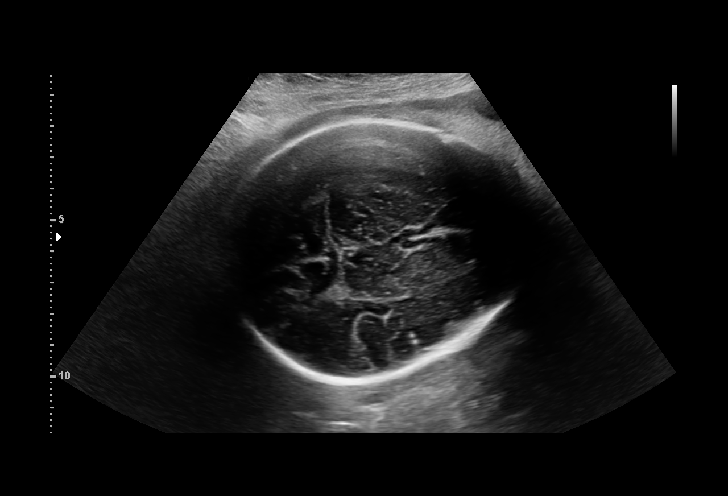
[im 10/30]
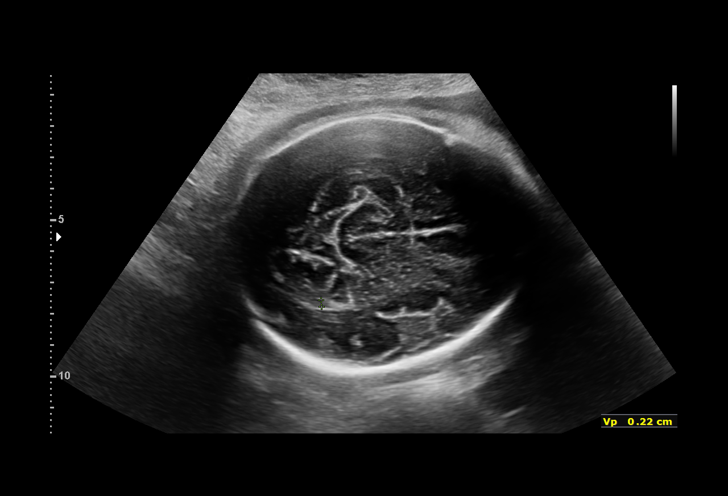
[im 12/30]
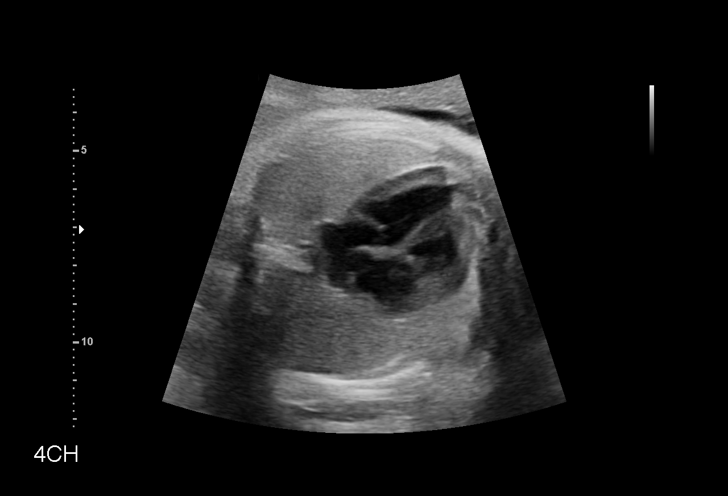
[im 14/30]
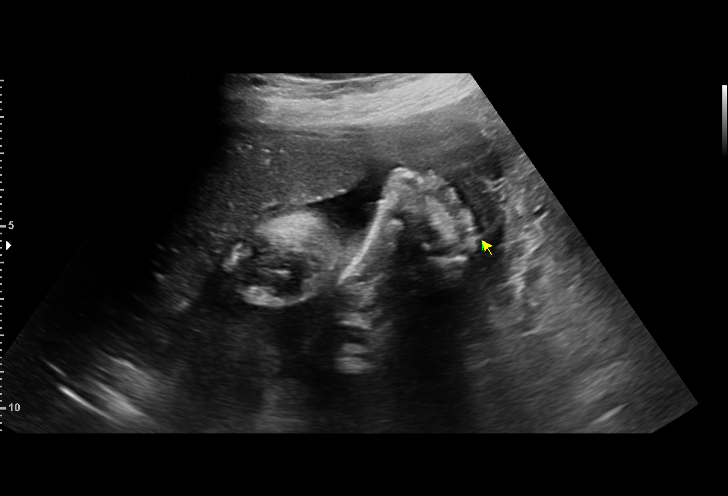
[im 17/30]
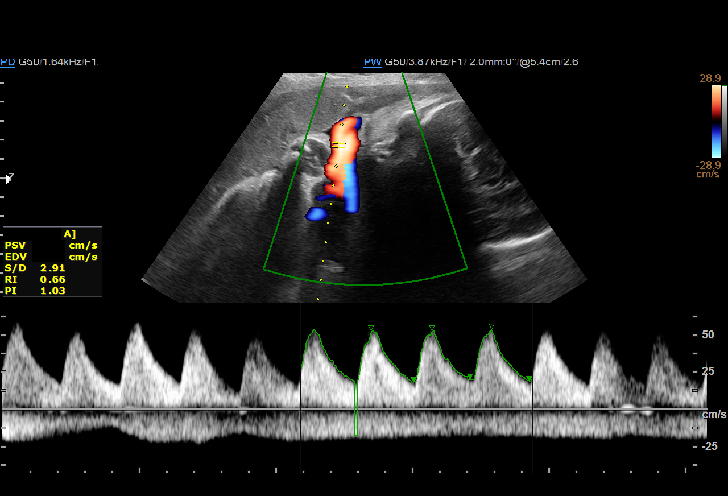
[im 19/30]
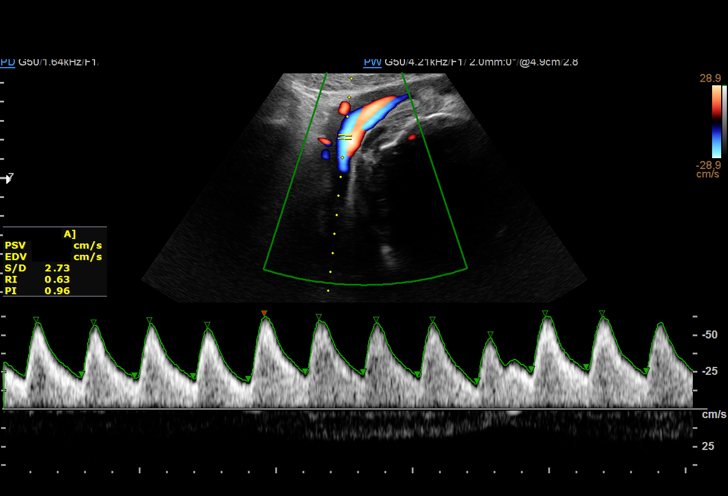
[im 21/30]
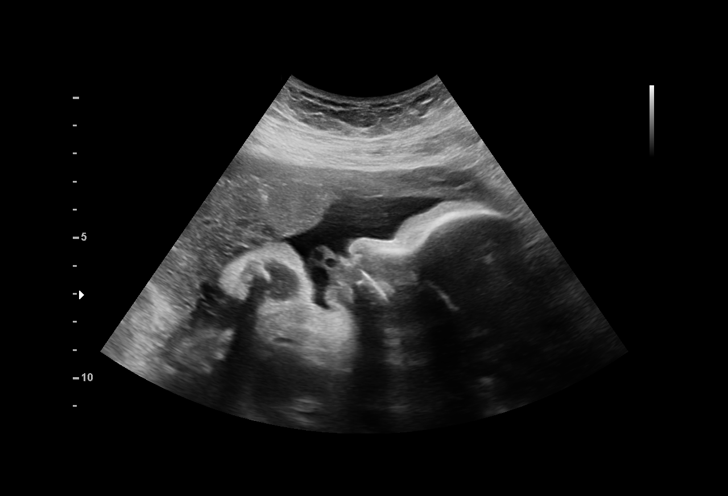
[im 23/30]
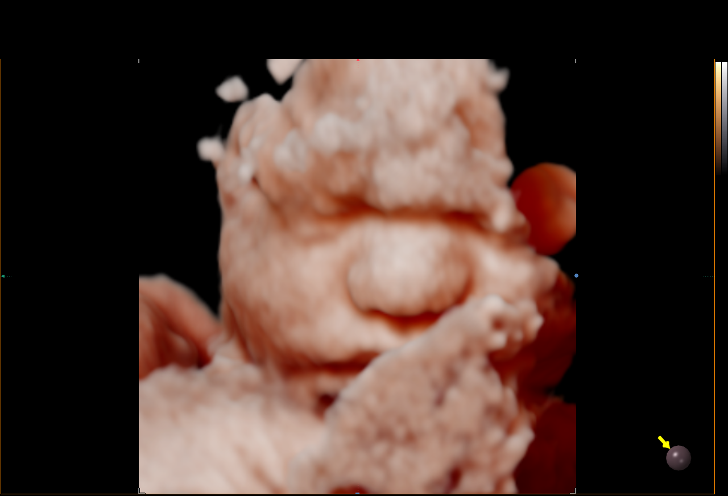
[im 25/30]
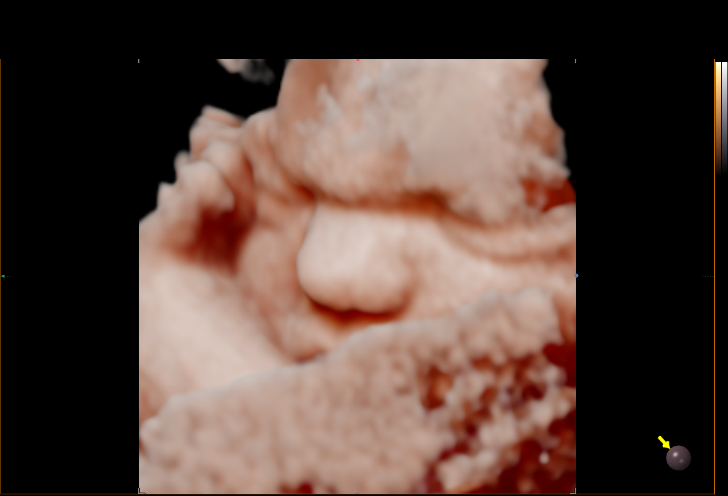
[im 27/30]
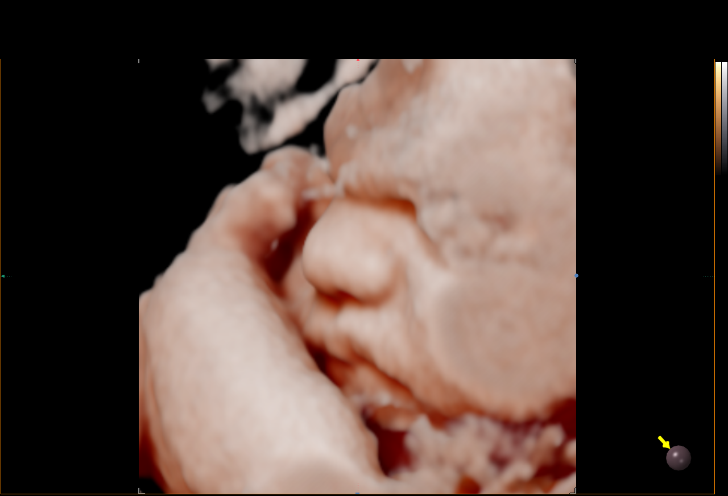
[im 30/30]
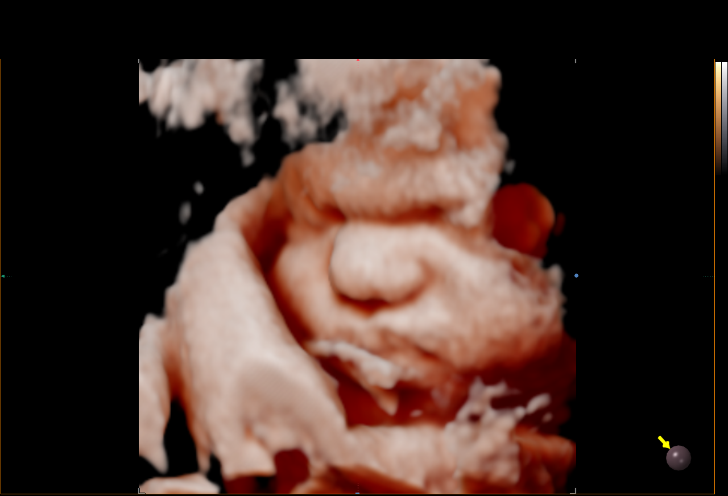

[14 of 28 positions shown; findings below may reference images not displayed]

W/NONSTRESS

Indications

 Gestational hypertension without significant
 proteinuria, third trimester
 Maternal care for known or suspected poor
 fetal growth, third trimester, not applicable or
 unspecified IUGR
 Obesity complicating pregnancy, third
 trimester
 Poor obstetric history-Recurrent (habitual)
 abortion (3 consecutive ab's)
 Encounter for other antenatal screening
 follow-up
 Low Risk NIPS
 33 weeks gestation of pregnancy
Fetal Evaluation

 Num Of Fetuses:         1
 Fetal Heart Rate(bpm):  145
 Cardiac Activity:       Observed
 Presentation:           Cephalic
 Placenta:               Anterior
 P. Cord Insertion:      Previously Visualized

 Amniotic Fluid
 AFI FV:      Within normal limits

 AFI Sum(cm)     %Tile       Largest Pocket(cm)
 16.84           61
 RUQ(cm)       RLQ(cm)       LUQ(cm)        LLQ(cm)

Biophysical Evaluation

 Amniotic F.V:   Within normal limits       F. Tone:        Observed
 F. Movement:    Observed                   N.S.T:          Nonreactive
 F. Breathing:   Observed                   Score:          [DATE]
Biometry

 LV:        2.2  mm
OB History

 Gravidity:    4         Term:   0        Prem:   0        SAB:   3
 TOP:          0       Ectopic:  0        Living: 0
Gestational Age

 LMP:           33w 4d        Date:  11/29/20                 EDD:   09/05/21
 Best:          33w 4d     Det. By:  LMP  (11/29/20)          EDD:   09/05/21
Anatomy

 Ventricles:            Appears normal         Kidneys:                Appear normal
 Heart:                 Appears normal         Bladder:                Appears normal
                        (4CH, axis, and
                        situs)
 Stomach:               Appears normal, left
                        sided
Doppler - Fetal Vessels

 Umbilical Artery
  S/D     %tile      RI    %tile      PI    %tile     PSV    ADFV    RDFV
                                                    (cm/s)
  3.04       77    0.67       80    0.[REDACTED]      No      No

Cervix Uterus Adnexa

 Cervix
 Not visualized (advanced GA >81wks)

 Right Ovary
 Visualized.

 Left Ovary
 Not visualized.
Impression

 Fetal growth restriction.  On previous fetal growth
 assessment, the estimated fetal weight was at the 6th
 percentile.
 Gestational hypertension.  Patient takes nifedipine XL 30 mg
 daily.  Blood pressure today at our office is 139/84 mmHg.
 She does not have signs and symptoms of severe features of
 preeclampsia.

 Amniotic fluid is normal and good fetal activity seen.
 Umbilical artery Doppler showed normal forward diastolic
 flow.  NST is nonreactive.  BPP [DATE].
 We reassured the patient of the findings.
Recommendations

 -Fetal growth assessment next week.
 -Weekly BPP, UA Doppler and NST till delivery.
 -Delivery at 37 weeks gestation.
                 Shwetha, Ralte

## 2022-11-30 DIAGNOSIS — F411 Generalized anxiety disorder: Secondary | ICD-10-CM | POA: Diagnosis not present

## 2022-11-30 DIAGNOSIS — F332 Major depressive disorder, recurrent severe without psychotic features: Secondary | ICD-10-CM | POA: Diagnosis not present

## 2022-11-30 DIAGNOSIS — G4709 Other insomnia: Secondary | ICD-10-CM | POA: Diagnosis not present

## 2022-12-02 DIAGNOSIS — Z419 Encounter for procedure for purposes other than remedying health state, unspecified: Secondary | ICD-10-CM | POA: Diagnosis not present

## 2022-12-09 DIAGNOSIS — F332 Major depressive disorder, recurrent severe without psychotic features: Secondary | ICD-10-CM | POA: Diagnosis not present

## 2022-12-09 DIAGNOSIS — F411 Generalized anxiety disorder: Secondary | ICD-10-CM | POA: Diagnosis not present

## 2022-12-09 DIAGNOSIS — G4709 Other insomnia: Secondary | ICD-10-CM | POA: Diagnosis not present

## 2022-12-15 DIAGNOSIS — G4709 Other insomnia: Secondary | ICD-10-CM | POA: Diagnosis not present

## 2022-12-15 DIAGNOSIS — F411 Generalized anxiety disorder: Secondary | ICD-10-CM | POA: Diagnosis not present

## 2022-12-15 DIAGNOSIS — F332 Major depressive disorder, recurrent severe without psychotic features: Secondary | ICD-10-CM | POA: Diagnosis not present

## 2022-12-30 DIAGNOSIS — G4709 Other insomnia: Secondary | ICD-10-CM | POA: Diagnosis not present

## 2022-12-30 DIAGNOSIS — F411 Generalized anxiety disorder: Secondary | ICD-10-CM | POA: Diagnosis not present

## 2022-12-30 DIAGNOSIS — F332 Major depressive disorder, recurrent severe without psychotic features: Secondary | ICD-10-CM | POA: Diagnosis not present

## 2023-01-02 DIAGNOSIS — Z419 Encounter for procedure for purposes other than remedying health state, unspecified: Secondary | ICD-10-CM | POA: Diagnosis not present

## 2023-01-11 ENCOUNTER — Encounter: Payer: Self-pay | Admitting: Family Medicine

## 2023-01-11 ENCOUNTER — Other Ambulatory Visit: Payer: Self-pay | Admitting: Family Medicine

## 2023-01-11 MED ORDER — SUMATRIPTAN SUCCINATE 100 MG PO TABS: 100.0000 mg | ORAL_TABLET | ORAL | 2 refills | Status: DC | PRN

## 2023-01-19 ENCOUNTER — Ambulatory Visit: Payer: Medicaid Other | Admitting: Family Medicine

## 2023-01-19 NOTE — Progress Notes (Deleted)
Acute Office Visit  Subjective:     Patient ID: Dominique Vega, female    DOB: [birth year], 32 y.o.   MRN: [id number]  No chief complaint on file.   HPI Patient is in today for neck and shoulder pain  ROS      Objective:    There were no vitals taken for this visit. {Vitals History (Optional):23777}  Physical Exam  No results found for any visits on 01/19/23.      Assessment & Plan:   There are no diagnoses linked to this encounter.   No follow-ups on file.  Sandre Kitty, MD

## 2023-02-01 DIAGNOSIS — Z419 Encounter for procedure for purposes other than remedying health state, unspecified: Secondary | ICD-10-CM | POA: Diagnosis not present

## 2023-02-13 DIAGNOSIS — M542 Cervicalgia: Secondary | ICD-10-CM | POA: Diagnosis not present

## 2023-02-24 ENCOUNTER — Encounter: Payer: Self-pay | Admitting: Family Medicine

## 2023-02-28 ENCOUNTER — Other Ambulatory Visit: Payer: Self-pay | Admitting: Family Medicine

## 2023-02-28 MED ORDER — WEGOVY 0.25 MG/0.5ML ~~LOC~~ SOAJ: 0.2500 mg | SUBCUTANEOUS | 3 refills | Status: DC

## 2023-03-01 ENCOUNTER — Ambulatory Visit (INDEPENDENT_AMBULATORY_CARE_PROVIDER_SITE_OTHER): Payer: Medicaid Other | Admitting: Family Medicine

## 2023-03-01 ENCOUNTER — Telehealth: Payer: Self-pay | Admitting: *Deleted

## 2023-03-01 VITALS — Ht 60.0 in | Wt 174.4 lb

## 2023-03-01 DIAGNOSIS — Z6834 Body mass index (BMI) 34.0-34.9, adult: Secondary | ICD-10-CM

## 2023-03-01 DIAGNOSIS — E66811 Obesity, class 1: Secondary | ICD-10-CM | POA: Diagnosis not present

## 2023-03-01 DIAGNOSIS — E6609 Other obesity due to excess calories: Secondary | ICD-10-CM

## 2023-03-01 NOTE — Telephone Encounter (Signed)
[facility] calling about PA for [facility] asking for wieght and bmi from last 45 days, last visit was on May 23,2024.  She then asked for fax number and this was given to her.

## 2023-03-01 NOTE — Progress Notes (Signed)
Patient is here for a weight check.

## 2023-03-04 ENCOUNTER — Other Ambulatory Visit: Payer: Self-pay | Admitting: Family Medicine

## 2023-03-04 DIAGNOSIS — Z419 Encounter for procedure for purposes other than remedying health state, unspecified: Secondary | ICD-10-CM | POA: Diagnosis not present

## 2023-03-04 MED ORDER — SEMAGLUTIDE-WEIGHT MANAGEMENT 0.5 MG/0.5ML ~~LOC~~ SOAJ: 0.5000 mg | SUBCUTANEOUS | 3 refills | Status: DC

## 2023-03-11 ENCOUNTER — Other Ambulatory Visit: Payer: Self-pay | Admitting: Family Medicine

## 2023-03-11 DIAGNOSIS — I10 Essential (primary) hypertension: Secondary | ICD-10-CM

## 2023-03-28 ENCOUNTER — Encounter: Payer: Self-pay | Admitting: Family Medicine

## 2023-03-28 ENCOUNTER — Ambulatory Visit (INDEPENDENT_AMBULATORY_CARE_PROVIDER_SITE_OTHER): Payer: Medicaid Other | Admitting: Family Medicine

## 2023-03-28 VITALS — BP 101/73 | HR 93 | Ht 60.0 in | Wt 169.1 lb

## 2023-03-28 DIAGNOSIS — E6609 Other obesity due to excess calories: Secondary | ICD-10-CM | POA: Diagnosis not present

## 2023-03-28 DIAGNOSIS — Z6834 Body mass index (BMI) 34.0-34.9, adult: Secondary | ICD-10-CM | POA: Diagnosis not present

## 2023-03-28 DIAGNOSIS — E66811 Obesity, class 1: Secondary | ICD-10-CM | POA: Diagnosis not present

## 2023-03-28 NOTE — Progress Notes (Signed)
   Established Patient Office Visit  Subjective   Patient ID: Dominique Vega, female    DOB: 1990-08-31  Age: 32 y.o. MRN: [id number]  Chief Complaint  Patient presents with   Medical Management of Chronic Issues    HPI   Weight-patient has received her 0.5 mg Wegovy dose.  She has not yet started it.  Will start at the next time she injects.  Side effects included some mild nausea on the first day of injection, as well as constipation.  Generally previously went daily.  Now is going every 3 days or so.  Is taking MiraLAX and has also taken Dulcolax as needed.  We discussed constipation medication.  We discussed nausea medicines.  Is currently doing portion control and limiting her sugar intake and empty calories from sodas.  We discussed exercise goals of 150 minutes/week.  Patient is currently working out using further switch videogame.  Does this 3 times a week for about 30 minutes.   The ASCVD Risk score (Arnett DK, et al., 2019) failed to calculate for the following reasons:   The 2019 ASCVD risk score is only valid for ages 41 to 61  Health Maintenance Due  Topic Date Due   Hepatitis C Screening  Never done   COVID-19 Vaccine (4 - 2023-24 season) 01/02/2023   Cervical Cancer Screening (HPV/Pap Cotest)  03/21/2023      Objective:     BP 101/73   Pulse 93   Ht 5' (1.524 m)   Wt 169 lb 1.9 oz (76.7 kg)   LMP 03/03/2023   SpO2 97%   BMI 33.03 kg/m    Physical Exam  General: Alert, oriented CV: Regular in rhythm Pulmonary: Lungs clear bilaterally    No results found for any visits on 03/28/23.      Assessment & Plan:   Class 1 obesity due to excess calories without serious comorbidity with body mass index (BMI) of 34.0 to 34.9 in adult Assessment & Plan: Patient has lost approximately 5 pounds since last time I saw her about a month ago.  This is about 1 pound per week. - Goal 1 to 2 pounds per week weight loss - Continue Wegovy - Continue portion  control and eliminating empty calories - Goal exercise of 150 minutes/week at moderate intensity - Add nausea medication if patient has symptoms - Recommended MiraLAX with goal of bowel movement every 1 to 2 days.       Return in about 3 months (around 06/28/2023) for weight mgmt.    Sandre Kitty, MD

## 2023-03-28 NOTE — Assessment & Plan Note (Addendum)
Patient has lost approximately 5 pounds since last time I saw her about a month ago.  This is about 1 pound per week. - Goal 1 to 2 pounds per week weight loss - Continue Wegovy - Continue portion control and eliminating empty calories - Goal exercise of 150 minutes/week at moderate intensity - Add nausea medication if patient has symptoms - Recommended MiraLAX with goal of bowel movement every 1 to 2 days.

## 2023-03-28 NOTE — Patient Instructions (Addendum)
It was nice to see you today,  We addressed the following topics today: -You are down about 5 to 6 pounds since last time I saw you.  Your goal should be about 1 to 2 pounds of weight loss per week - Continue with your current diet.  Try to exercise about 30 minutes a day 5 days a week at a moderate intensity.  This should be enough to get your heart rate up but not so much that you are out of breath.  Also you can track your steps and shoot for a goal of at least 8000 steps per day - If you have issues with nausea and want a nausea medication just let us know and I will send in a prescription - For constipation take MiraLAX in the morning 1 scoop with 4 ounces of water.  If after 3 days you have not had a bowel movement add a second scoop an additional 4 ounces.  She is ready to go  Have a great day,  Frederic Jericho, MD

## 2023-04-03 DIAGNOSIS — Z419 Encounter for procedure for purposes other than remedying health state, unspecified: Secondary | ICD-10-CM | POA: Diagnosis not present

## 2023-04-04 ENCOUNTER — Encounter: Payer: Self-pay | Admitting: Family Medicine

## 2023-04-04 DIAGNOSIS — I10 Essential (primary) hypertension: Secondary | ICD-10-CM

## 2023-04-05 MED ORDER — ENALAPRIL MALEATE 5 MG PO TABS: 5.0000 mg | ORAL_TABLET | Freq: Every day | ORAL | 0 refills | Status: DC

## 2023-04-05 NOTE — Addendum Note (Signed)
Addended by: Tonny Bollman on: 04/05/2023 08:35 AM   Modules accepted: Orders

## 2023-04-10 ENCOUNTER — Other Ambulatory Visit: Payer: Self-pay | Admitting: Family Medicine

## 2023-04-21 ENCOUNTER — Other Ambulatory Visit: Payer: Self-pay | Admitting: Family Medicine

## 2023-04-21 ENCOUNTER — Encounter: Payer: Self-pay | Admitting: Family Medicine

## 2023-04-21 MED ORDER — WEGOVY 1 MG/0.5ML ~~LOC~~ SOAJ: 1.0000 mg | SUBCUTANEOUS | 2 refills | Status: DC

## 2023-05-04 DIAGNOSIS — Z419 Encounter for procedure for purposes other than remedying health state, unspecified: Secondary | ICD-10-CM | POA: Diagnosis not present

## 2023-06-04 DIAGNOSIS — Z419 Encounter for procedure for purposes other than remedying health state, unspecified: Secondary | ICD-10-CM | POA: Diagnosis not present

## 2023-06-14 DIAGNOSIS — G4709 Other insomnia: Secondary | ICD-10-CM | POA: Diagnosis not present

## 2023-06-14 DIAGNOSIS — F332 Major depressive disorder, recurrent severe without psychotic features: Secondary | ICD-10-CM | POA: Diagnosis not present

## 2023-06-14 DIAGNOSIS — F411 Generalized anxiety disorder: Secondary | ICD-10-CM | POA: Diagnosis not present

## 2023-06-28 ENCOUNTER — Ambulatory Visit (INDEPENDENT_AMBULATORY_CARE_PROVIDER_SITE_OTHER): Payer: Medicaid Other | Admitting: Family Medicine

## 2023-06-28 VITALS — BP 101/64 | HR 108 | Ht 60.0 in | Wt 152.8 lb

## 2023-06-28 DIAGNOSIS — E782 Mixed hyperlipidemia: Secondary | ICD-10-CM

## 2023-06-28 DIAGNOSIS — E6609 Other obesity due to excess calories: Secondary | ICD-10-CM | POA: Diagnosis not present

## 2023-06-28 DIAGNOSIS — E66811 Obesity, class 1: Secondary | ICD-10-CM | POA: Diagnosis not present

## 2023-06-28 DIAGNOSIS — Z6834 Body mass index (BMI) 34.0-34.9, adult: Secondary | ICD-10-CM | POA: Diagnosis not present

## 2023-06-28 DIAGNOSIS — I10 Essential (primary) hypertension: Secondary | ICD-10-CM | POA: Diagnosis not present

## 2023-06-28 MED ORDER — ONDANSETRON HCL 4 MG PO TABS: 4.0000 mg | ORAL_TABLET | Freq: Three times a day (TID) | ORAL | 1 refills | Status: AC | PRN

## 2023-06-28 NOTE — Assessment & Plan Note (Signed)
 Continues to lose weight on Wegovy, now on 1 mg.  Tolerating it well except for 1 day of nausea after injection.  Prescribing Zofran today.  Continue current dose.  Goal weight 130 pounds.  Also decreasing portion sizes and also exercising 30 minutes a day 6 days a week.

## 2023-06-28 NOTE — Progress Notes (Signed)
   Established Patient Office Visit  Subjective   Patient ID: Dominique Vega, female    DOB: May 28, 1990  Age: 33 y.o. MRN: [id number]  Chief Complaint  Patient presents with   Medical Management of Chronic Issues    HPI  Weight-patient continuing to take Wegovy 1 mg.  Her weight has continued to decrease and is now 152 pounds.  She weighs herself once a week.  She exercises about 30 minutes every day except Sunday and evening using her ring fit on her Nintendo switch.  She also is eating smaller more frequent meals and more water. She complains of nausea usually the day after she injects herself with [facility]  We discussed nausea medication.  Hypertension-patient is taking enalapril 5 mg daily.  We discussed titrating this down and checking her blood pressure to see if it is still necessary now that she has lost weight       Body mass index is 29.84 kg/m.    The ASCVD Risk score (Arnett DK, et al., 2019) failed to calculate for the following reasons:   The 2019 ASCVD risk score is only valid for ages 79 to 61  Health Maintenance Due  Topic Date Due   Hepatitis C Screening  Never done   COVID-19 Vaccine (4 - 2024-25 season) 01/02/2023   Cervical Cancer Screening (HPV/Pap Cotest)  03/21/2023      Objective:     BP 101/64   Pulse (!) 108   Ht 5' (1.524 m)   Wt 152 lb 12.8 oz (69.3 kg)   SpO2 97%   BMI 29.84 kg/m    Physical Exam General: Alert, oriented Pulmonary: No respiratory stress Psych: Pleasant affect.   No results found for any visits on 06/28/23.      Assessment & Plan:   Class 1 obesity due to excess calories without serious comorbidity with body mass index (BMI) of 34.0 to 34.9 in adult Assessment & Plan: Continues to lose weight on Wegovy, now on 1 mg.  Tolerating it well except for 1 day of nausea after injection.  Prescribing Zofran today.  Continue current dose.  Goal weight 130 pounds.  Also decreasing portion sizes and also exercising 30  minutes a day 6 days a week.   Primary hypertension Assessment & Plan: Patient's blood pressure much better now that she has lost weight.  Was 101/64 today.  Discussed decreasing the enalapril by cutting in half and rechecking.  And then stopping completely and rechecking if blood pressure is still at goal or better of less than 130/85.   Hypocalcemia -     Comprehensive metabolic panel  Moderate mixed hyperlipidemia not requiring statin therapy -     Lipid panel  Other orders -     Ondansetron HCl; Take 1 tablet (4 mg total) by mouth every 8 (eight) hours as needed for nausea or vomiting.  Dispense: 20 tablet; Refill: 1     Return in about 3 months (around 09/25/2023) for weight.    Sandre Kitty, MD

## 2023-06-28 NOTE — Assessment & Plan Note (Signed)
 Patient's blood pressure much better now that she has lost weight.  Was 101/64 today.  Discussed decreasing the enalapril by cutting in half and rechecking.  And then stopping completely and rechecking if blood pressure is still at goal or better of less than 130/85.

## 2023-06-28 NOTE — Patient Instructions (Addendum)
 It was nice to see you today,  We addressed the following topics today: -Your weight is continuing to improve.  130 pounds would be a good goal to set  for yourself - You can try titrating down your blood pressure medication as long as you continue to check your blood pressure at home. - You can cut the enalapril in half and take a half a tablet a day - If you decrease it you should check your blood pressure at least once a day.  The goal should be less than 130/85. - If your still meeting this goal on a half a tablet of enalapril you can then stop taking it completely and continue to recheck it.  But the goal should still be less than 130/85. - I am rechecking your cholesterol level and your calcium level to make sure they return to normal.  Your cholesterol level might have improved now that you have lost weight. - I have prescribed Zofran for you to use for the nausea.  Let me know if it does not help.  Have a great day,  Frederic Jericho, MD

## 2023-06-29 ENCOUNTER — Encounter: Payer: Self-pay | Admitting: Family Medicine

## 2023-06-29 LAB — LIPID PANEL
Chol/HDL Ratio: 4.8 {ratio} — ABNORMAL HIGH (ref 0.0–4.4)
Cholesterol, Total: 201 mg/dL — ABNORMAL HIGH (ref 100–199)
HDL: 42 mg/dL (ref >39)
LDL Chol Calc (NIH): 141 mg/dL — ABNORMAL HIGH (ref 0–99)
Triglycerides: 98 mg/dL (ref 0–149)
VLDL Cholesterol Cal: 18 mg/dL (ref 5–40)

## 2023-06-29 LAB — COMPREHENSIVE METABOLIC PANEL
ALT: 13 [IU]/L (ref 0–32)
AST: 19 [IU]/L (ref 0–40)
Albumin: 4.5 g/dL (ref 3.9–4.9)
Alkaline Phosphatase: 91 [IU]/L (ref 44–121)
BUN/Creatinine Ratio: 11 (ref 9–23)
BUN: 9 mg/dL (ref 6–20)
Bilirubin Total: 0.4 mg/dL (ref 0.0–1.2)
CO2: 21 mmol/L (ref 20–29)
Calcium: 9.7 mg/dL (ref 8.7–10.2)
Chloride: 102 mmol/L (ref 96–106)
Creatinine, Ser: 0.81 mg/dL (ref 0.57–1.00)
Globulin, Total: 3 g/dL (ref 1.5–4.5)
Glucose: 76 mg/dL (ref 70–99)
Potassium: 3.9 mmol/L (ref 3.5–5.2)
Sodium: 141 mmol/L (ref 134–144)
Total Protein: 7.5 g/dL (ref 6.0–8.5)
eGFR: 98 mL/min/{1.73_m2} (ref >59)

## 2023-07-02 DIAGNOSIS — Z419 Encounter for procedure for purposes other than remedying health state, unspecified: Secondary | ICD-10-CM | POA: Diagnosis not present

## 2023-07-09 ENCOUNTER — Other Ambulatory Visit: Payer: Self-pay | Admitting: Family Medicine

## 2023-07-09 DIAGNOSIS — I10 Essential (primary) hypertension: Secondary | ICD-10-CM

## 2023-07-25 ENCOUNTER — Encounter (HOSPITAL_COMMUNITY): Payer: Self-pay

## 2023-07-25 ENCOUNTER — Other Ambulatory Visit: Payer: Self-pay

## 2023-07-25 ENCOUNTER — Emergency Department (HOSPITAL_COMMUNITY)
Admission: EM | Admit: 2023-07-25 | Discharge: 2023-07-26 | Disposition: A | Discharge status: Home / Self Care | Attending: Emergency Medicine | Admitting: Emergency Medicine

## 2023-07-25 ENCOUNTER — Other Ambulatory Visit: Payer: Self-pay | Admitting: Family Medicine

## 2023-07-25 DIAGNOSIS — R112 Nausea with vomiting, unspecified: Secondary | ICD-10-CM | POA: Diagnosis not present

## 2023-07-25 DIAGNOSIS — R509 Fever, unspecified: Secondary | ICD-10-CM | POA: Diagnosis not present

## 2023-07-25 DIAGNOSIS — D72829 Elevated white blood cell count, unspecified: Secondary | ICD-10-CM | POA: Diagnosis not present

## 2023-07-25 DIAGNOSIS — B349 Viral infection, unspecified: Secondary | ICD-10-CM | POA: Insufficient documentation

## 2023-07-25 DIAGNOSIS — R109 Unspecified abdominal pain: Secondary | ICD-10-CM | POA: Diagnosis not present

## 2023-07-25 DIAGNOSIS — Z79899 Other long term (current) drug therapy: Secondary | ICD-10-CM | POA: Insufficient documentation

## 2023-07-25 LAB — URINALYSIS, ROUTINE W REFLEX MICROSCOPIC
Bilirubin Urine: NEGATIVE
Glucose, UA: NEGATIVE mg/dL
Ketones, ur: 80 mg/dL — AB
Leukocytes,Ua: NEGATIVE
Nitrite: NEGATIVE
Protein, ur: 30 mg/dL — AB
RBC / HPF: >50 RBC/hpf (ref 0–5)
Specific Gravity, Urine: 1.031 — ABNORMAL HIGH (ref 1.005–1.030)
pH: 6 (ref 5.0–8.0)

## 2023-07-25 LAB — CBC
HCT: 39.9 % (ref 36.0–46.0)
Hemoglobin: 13.8 g/dL (ref 12.0–15.0)
MCH: 29.9 pg (ref 26.0–34.0)
MCHC: 34.6 g/dL (ref 30.0–36.0)
MCV: 86.4 fL (ref 80.0–100.0)
Platelets: 309 10*3/uL (ref 150–400)
RBC: 4.62 MIL/uL (ref 3.87–5.11)
RDW: 13.8 % (ref 11.5–15.5)
WBC: 12.1 10*3/uL — ABNORMAL HIGH (ref 4.0–10.5)
nRBC: 0 % (ref 0.0–0.2)

## 2023-07-25 LAB — BASIC METABOLIC PANEL
Anion gap: 15 (ref 5–15)
BUN: 12 mg/dL (ref 6–20)
CO2: 15 mmol/L — ABNORMAL LOW (ref 22–32)
Calcium: 8.6 mg/dL — ABNORMAL LOW (ref 8.9–10.3)
Chloride: 106 mmol/L (ref 98–111)
Creatinine, Ser: 0.71 mg/dL (ref 0.44–1.00)
GFR, Estimated: >60 mL/min (ref >60)
Glucose, Bld: 81 mg/dL (ref 70–99)
Potassium: 3.6 mmol/L (ref 3.5–5.1)
Sodium: 136 mmol/L (ref 135–145)

## 2023-07-25 LAB — HCG, SERUM, QUALITATIVE: Preg, Serum: NEGATIVE

## 2023-07-25 NOTE — ED Triage Notes (Signed)
 PT arrived POV from home c/o right flank pain that started around 5am with N/V/D. Pt also states she had a fever of a 103 at home.

## 2023-07-26 ENCOUNTER — Emergency Department (HOSPITAL_COMMUNITY)

## 2023-07-26 DIAGNOSIS — R509 Fever, unspecified: Secondary | ICD-10-CM | POA: Diagnosis not present

## 2023-07-26 DIAGNOSIS — R109 Unspecified abdominal pain: Secondary | ICD-10-CM | POA: Diagnosis not present

## 2023-07-26 DIAGNOSIS — R112 Nausea with vomiting, unspecified: Secondary | ICD-10-CM | POA: Diagnosis not present

## 2023-07-26 LAB — RESP PANEL BY RT-PCR (RSV, FLU A&B, COVID)  RVPGX2
Influenza A by PCR: NEGATIVE
Influenza B by PCR: NEGATIVE
Resp Syncytial Virus by PCR: NEGATIVE
SARS Coronavirus 2 by RT PCR: NEGATIVE

## 2023-07-26 MED ORDER — SODIUM CHLORIDE 0.9 % IV BOLUS
1000.0000 mL | Freq: Once | INTRAVENOUS | Status: AC
  Administered 2023-07-26: 1000 mL via INTRAVENOUS

## 2023-07-26 MED ORDER — ONDANSETRON HCL 4 MG/2ML IJ SOLN
4.0000 mg | Freq: Once | INTRAMUSCULAR | Status: AC
  Administered 2023-07-26: 4 mg via INTRAVENOUS
  Filled 2023-07-26: qty 2

## 2023-07-26 MED ORDER — MORPHINE SULFATE (PF) 4 MG/ML IV SOLN
4.0000 mg | Freq: Once | INTRAVENOUS | Status: AC
  Administered 2023-07-26: 4 mg via INTRAVENOUS
  Filled 2023-07-26: qty 1

## 2023-07-26 MED ORDER — IOHEXOL 350 MG/ML SOLN
75.0000 mL | Freq: Once | INTRAVENOUS | Status: AC | PRN
  Administered 2023-07-26: 75 mL via INTRAVENOUS

## 2023-07-26 MED ORDER — ONDANSETRON 4 MG PO TBDP
4.0000 mg | ORAL_TABLET | Freq: Three times a day (TID) | ORAL | 0 refills | Status: AC | PRN
Start: 2023-07-26 — End: ?

## 2023-07-26 NOTE — ED Provider Notes (Signed)
 [name] EMERGENCY DEPARTMENT AT [facility] Provider Note   CSN: [id number] Arrival date & time: 07/25/23  2122     History  Chief Complaint  Patient presents with   Flank Pain    Dominique Vega is a 33 y.o. female.  HPI     This is a 33 year old female who presents with nausea, vomiting, fever, flank pain.  Patient reports that she woke up yesterday morning and had multiple episodes of diarrhea and nausea.  She subsequently developed fever to 103.  She began to have some right sided flank pain.  No dysuria.  She is currently on her menstrual cycle so is unsure whether she has had any blood in her urine.  No history of kidney stones but has had a history of a kidney infection in the past.  Home Medications Prior to Admission medications   Medication Sig Start Date End Date Taking? Authorizing Provider  ondansetron (ZOFRAN-ODT) 4 MG disintegrating tablet Take 1 tablet (4 mg total) by mouth every 8 (eight) hours as needed. 07/26/23  Yes Siearra Amberg, Mayer Masker, MD  enalapril (VASOTEC) 5 MG tablet TAKE 1 TABLET (5 MG TOTAL) BY MOUTH DAILY. 07/11/23   Sandre Kitty, MD  ondansetron (ZOFRAN) 4 MG tablet Take 1 tablet (4 mg total) by mouth every 8 (eight) hours as needed for nausea or vomiting. 06/28/23   Sandre Kitty, MD  Prenatal Vit-Fe Fumarate-FA (PNV PRENATAL PLUS MULTIVITAMIN) 27-1 MG TABS Take 1 tablet by mouth daily.    [provider]  QUEtiapine (SEROQUEL) 25 MG tablet Take 25 mg by mouth at bedtime. 03/13/23   [provider]  Semaglutide-Weight Management (WEGOVY) 1 MG/0.5ML SOAJ INJECT 1MG  INTO THE SKIN ONCE A WEEK 07/25/23   Sandre Kitty, MD  sertraline (ZOLOFT) 50 MG tablet Take 50 mg by mouth daily.    [provider]  SUMAtriptan (IMITREX) 100 MG tablet TAKE 1 TABLET BY MOUTH EVERY 2 HOURS AS NEEDED FOR MIGRAINE. NOT TO EXCEED 2 DOSES IN 24 HOUR PERIOD 07/25/23   Sandre Kitty, MD      Allergies    Penicillins    Review of  Systems   Review of Systems  Constitutional:  Positive for fever.  Respiratory:  Negative for shortness of breath.   Cardiovascular:  Negative for chest pain.  Gastrointestinal:  Positive for diarrhea, nausea and vomiting. Negative for abdominal pain.  Genitourinary:  Positive for flank pain.  All other systems reviewed and are negative.   Physical Exam Updated Vital Signs BP 100/64   Pulse 100   Temp 98.6 F (37 C) (Oral)   Resp 19   Ht 1.524 m (5')   Wt 68 kg   SpO2 100%   Breastfeeding No   BMI 29.29 kg/m  Physical Exam Vitals and nursing note reviewed.  Constitutional:      Appearance: She is well-developed. She is not ill-appearing.  HENT:     Head: Normocephalic and atraumatic.     Mouth/Throat:     Mouth: Mucous membranes are dry.  Eyes:     Pupils: Pupils are equal, round, and reactive to light.  Cardiovascular:     Rate and Rhythm: Regular rhythm. Tachycardia present.     Heart sounds: Normal heart sounds.  Pulmonary:     Effort: Pulmonary effort is normal. No respiratory distress.     Breath sounds: No wheezing.  Abdominal:     Palpations: Abdomen is soft.     Tenderness: There  is abdominal tenderness. There is no right CVA tenderness or left CVA tenderness.     Comments: Generalized tenderness to palpation  Musculoskeletal:     Cervical back: Neck supple.  Skin:    General: Skin is warm and dry.  Neurological:     Mental Status: She is alert and oriented to person, place, and time.  Psychiatric:        Mood and Affect: Mood normal.     ED Results / Procedures / Treatments   Labs (all labs ordered are listed, but only abnormal results are displayed) Labs Reviewed  URINALYSIS, ROUTINE W REFLEX MICROSCOPIC - Abnormal; Notable for the following components:      Result Value   APPearance HAZY (*)    Specific Gravity, Urine 1.031 (*)    Hgb urine dipstick LARGE (*)    Ketones, ur 80 (*)    Protein, ur 30 (*)    Bacteria, UA FEW (*)    All other  components within normal limits  BASIC METABOLIC PANEL - Abnormal; Notable for the following components:   CO2 15 (*)    Calcium 8.6 (*)    All other components within normal limits  CBC - Abnormal; Notable for the following components:   WBC 12.1 (*)    All other components within normal limits  RESP PANEL BY RT-PCR (RSV, FLU A&B, COVID)  RVPGX2  HCG, SERUM, QUALITATIVE    EKG None  Radiology CT ABDOMEN PELVIS W CONTRAST Result Date: 07/26/2023 CLINICAL DATA:  Abdominal and flank pain with stone suspected. Nausea, vomiting, and diarrhea. Right flank pain and fever. EXAM: CT ABDOMEN AND PELVIS WITH CONTRAST TECHNIQUE: Multidetector CT imaging of the abdomen and pelvis was performed using the standard protocol following bolus administration of intravenous contrast. RADIATION DOSE REDUCTION: This exam was performed according to the departmental dose-optimization program which includes automated exposure control, adjustment of the mA and/or kV according to patient size and/or use of iterative reconstruction technique. CONTRAST:  75mL OMNIPAQUE IOHEXOL 350 MG/ML SOLN COMPARISON:  MRI abdomen 02/25/2021 FINDINGS: Lower chest: Lung bases are clear. Hepatobiliary: No focal liver abnormality is seen. No gallstones, gallbladder wall thickening, or biliary dilatation. Pancreas: Unremarkable. No pancreatic ductal dilatation or surrounding inflammatory changes. Spleen: Normal in size without focal abnormality. Adrenals/Urinary Tract: Adrenal glands are unremarkable. Kidneys are normal, without renal calculi, focal lesion, or hydronephrosis. Bladder is unremarkable. Stomach/Bowel: Stomach is within normal limits. Appendix appears normal. No evidence of bowel wall thickening, distention, or inflammatory changes. Vascular/Lymphatic: No significant vascular findings are present. No enlarged abdominal or pelvic lymph nodes. Reproductive: Uterus and bilateral adnexa are unremarkable. Other: No abdominal wall hernia  or abnormality. No abdominopelvic ascites. Musculoskeletal: No acute or significant osseous findings. IMPRESSION: No acute process demonstrated in the abdomen or pelvis. No evidence of bowel obstruction or inflammation. Electronically Signed   By: Burman Nieves M.D.   On: 07/26/2023 01:38    Procedures Procedures    Medications Ordered in ED Medications  sodium chloride 0.9 % bolus 1,000 mL (0 mLs Intravenous Stopped 07/26/23 0252)  morphine (PF) 4 MG/ML injection 4 mg (4 mg Intravenous Given 07/26/23 0106)  ondansetron (ZOFRAN) injection 4 mg (4 mg Intravenous Given 07/26/23 0105)  iohexol (OMNIPAQUE) 350 MG/ML injection 75 mL (75 mLs Intravenous Contrast Given 07/26/23 0134)    ED Course/ Medical Decision Making/ A&P  Medical Decision Making Amount and/or Complexity of Data Reviewed Labs: ordered. Radiology: ordered.  Risk Prescription drug management.   This patient presents to the ED for concern of fever, nausea, vomiting, diarrhea, this involves an extensive number of treatment options, and is a complaint that carries with it a high risk of complications and morbidity.  I considered the following differential and admission for this acute, potentially life threatening condition.  The differential diagnosis includes viral illness such as COVID, influenza, norovirus, pyelonephritis, kidney stone  MDM:    This is a 33 year old female who presents with nausea, vomiting, diarrhea, fever and onset of flank pain.  She is tachycardic.  She appears dry on exam.  She is afebrile here.  Patient was given fluids, pain, nausea medication.  Labs notable for mild leukocytosis.  Urinalysis is not consistent with UTI.  COVID and influenza testing negative.  CT scan of the abdomen pelvis does not show any evidence of kidney stone.  There is no obvious pyelonephritis or appendicitis.  On recheck, patient states she feels much better.  Suspect this may all be viral in  nature and the flank pain potentially musculoskeletal from vomiting.  Recommend supportive measures at home.  (Labs, imaging, consults)  Labs: I Ordered, and personally interpreted labs.  The pertinent results include: CBC, BMP, urinalysis, beta-hCG  Imaging Studies ordered: I ordered imaging studies including CT I independently visualized and interpreted imaging. I agree with the radiologist interpretation  Additional history obtained from chart review.  External records from outside source obtained and reviewed including prior evaluations  Cardiac Monitoring: The patient was maintained on a cardiac monitor.  If on the cardiac monitor, I personally viewed and interpreted the cardiac monitored which showed an underlying rhythm of: Sinus  Reevaluation: After the interventions noted above, I reevaluated the patient and found that they have :improved  Social Determinants of Health:  lives independently  Disposition: Discharge  Co morbidities that complicate the patient evaluation  Past Medical History:  Diagnosis Date   Anxiety    Headache    History of miscarriage    History of pre-eclampsia 07/29/2021   History of prior pregnancy with IUGR newborn 07/07/2021   Irritable bowel syndrome    Ovarian cyst    Pyelonephritis 01/25/2019   UTI (urinary tract infection)      Medicines Meds ordered this encounter  Medications   sodium chloride 0.9 % bolus 1,000 mL   morphine (PF) 4 MG/ML injection 4 mg   ondansetron (ZOFRAN) injection 4 mg   iohexol (OMNIPAQUE) 350 MG/ML injection 75 mL   ondansetron (ZOFRAN-ODT) 4 MG disintegrating tablet    Sig: Take 1 tablet (4 mg total) by mouth every 8 (eight) hours as needed.    Dispense:  20 tablet    Refill:  0    I have reviewed the patients home medicines and have made adjustments as needed  Problem List / ED Course: Problem List Items Addressed This Visit   None Visit Diagnoses       Viral illness    -  Primary     Flank  pain                       Final Clinical Impression(s) / ED Diagnoses Final diagnoses:  Viral illness  Flank pain    Rx / DC Orders ED Discharge Orders          Ordered    ondansetron (ZOFRAN-ODT) 4 MG disintegrating tablet  Every  8 hours PRN        07/26/23 0425              Shon Baton, MD 07/26/23 [phone]

## 2023-07-26 NOTE — ED Notes (Signed)
 AVS provided by EDP was reviewed with pt. Also reviewed medications given in ED. Pt was able to verbalize understanding of instructions. Denies any additional questions. Pt wheelchair to car by NT. Going home with s/o at bedside.

## 2023-07-26 NOTE — Discharge Instructions (Signed)
 You were seen today for fever and flank pain as well as nausea, vomiting, diarrhea.  This likely a viral illness.  Your CT imaging and urinalysis are reassuring.  Take Zofran as needed for nausea.  You may take Imodium.  Make sure that you are staying hydrated.

## 2023-08-13 DIAGNOSIS — Z419 Encounter for procedure for purposes other than remedying health state, unspecified: Secondary | ICD-10-CM | POA: Diagnosis not present

## 2023-09-06 DIAGNOSIS — G4709 Other insomnia: Secondary | ICD-10-CM | POA: Diagnosis not present

## 2023-09-06 DIAGNOSIS — F332 Major depressive disorder, recurrent severe without psychotic features: Secondary | ICD-10-CM | POA: Diagnosis not present

## 2023-09-06 DIAGNOSIS — F411 Generalized anxiety disorder: Secondary | ICD-10-CM | POA: Diagnosis not present

## 2023-09-12 ENCOUNTER — Ambulatory Visit: Admitting: Family Medicine

## 2023-09-12 DIAGNOSIS — Z419 Encounter for procedure for purposes other than remedying health state, unspecified: Secondary | ICD-10-CM | POA: Diagnosis not present

## 2023-09-21 ENCOUNTER — Other Ambulatory Visit: Payer: Self-pay | Admitting: Medical Genetics

## 2023-09-27 ENCOUNTER — Ambulatory Visit: Payer: Medicaid Other | Admitting: Family Medicine

## 2023-09-28 ENCOUNTER — Ambulatory Visit: Admitting: Family Medicine

## 2023-10-06 ENCOUNTER — Other Ambulatory Visit
Admission: RE | Admit: 2023-10-06 | Discharge: 2023-10-06 | Disposition: A | Discharge status: Home / Self Care | Payer: Self-pay | Source: Ambulatory Visit | Attending: Medical Genetics | Admitting: Medical Genetics

## 2023-10-06 ENCOUNTER — Other Ambulatory Visit

## 2023-10-13 DIAGNOSIS — Z419 Encounter for procedure for purposes other than remedying health state, unspecified: Secondary | ICD-10-CM | POA: Diagnosis not present

## 2023-10-19 ENCOUNTER — Ambulatory Visit: Admitting: Family Medicine

## 2023-10-21 LAB — GENECONNECT MOLECULAR SCREEN: Genetic Analysis Overall Interpretation: NEGATIVE

## 2023-11-04 ENCOUNTER — Other Ambulatory Visit: Payer: Self-pay | Admitting: Family Medicine

## 2023-11-04 DIAGNOSIS — I10 Essential (primary) hypertension: Secondary | ICD-10-CM

## 2023-11-10 ENCOUNTER — Ambulatory Visit (INDEPENDENT_AMBULATORY_CARE_PROVIDER_SITE_OTHER): Admitting: Family Medicine

## 2023-11-10 VITALS — BP 121/84 | HR 76 | Ht 60.0 in | Wt 146.4 lb

## 2023-11-10 DIAGNOSIS — I10 Essential (primary) hypertension: Secondary | ICD-10-CM | POA: Diagnosis not present

## 2023-11-10 DIAGNOSIS — F419 Anxiety disorder, unspecified: Secondary | ICD-10-CM

## 2023-11-10 DIAGNOSIS — E6609 Other obesity due to excess calories: Secondary | ICD-10-CM | POA: Diagnosis not present

## 2023-11-10 DIAGNOSIS — G4709 Other insomnia: Secondary | ICD-10-CM | POA: Diagnosis not present

## 2023-11-10 DIAGNOSIS — F332 Major depressive disorder, recurrent severe without psychotic features: Secondary | ICD-10-CM | POA: Diagnosis not present

## 2023-11-10 DIAGNOSIS — E66811 Obesity, class 1: Secondary | ICD-10-CM

## 2023-11-10 DIAGNOSIS — Z6834 Body mass index (BMI) 34.0-34.9, adult: Secondary | ICD-10-CM

## 2023-11-10 DIAGNOSIS — F411 Generalized anxiety disorder: Secondary | ICD-10-CM | POA: Diagnosis not present

## 2023-11-10 MED ORDER — LABETALOL HCL 200 MG PO TABS: 200.0000 mg | ORAL_TABLET | Freq: Two times a day (BID) | ORAL | 3 refills | Status: DC

## 2023-11-10 NOTE — Assessment & Plan Note (Signed)
 Patient stopping Wegovy  due to desire to become pregnant.  Weight today 146.

## 2023-11-10 NOTE — Patient Instructions (Signed)
 It was nice to see you today,  We addressed the following topics today: -I have sent in labetalol  for you to take instead of the enalapril  now that you are trying to conceive. - I would like you to check your blood pressure at home and let me know if it is above goal at the current dose of labetalol .  Your goal should be less than 130/85 on average.  Most readings should be less than 140. - You can just stop taking your Wegovy .  There is no need to taper off.  Have a great day,  Rolan Slain, MD

## 2023-11-10 NOTE — Progress Notes (Signed)
   Established Patient Office Visit  Subjective   Patient ID: Dominique Vega, female    DOB: 1990-06-09  Age: 33 y.o. MRN: [id number]  Chief Complaint  Patient presents with   Medical Management of Chronic Issues    HPI  Subjective - Discussed stopping Wegovy  in preparation for attempting conception in the next few months (September/October). - History of hypertension during previous pregnancy, managed with labetalol . Towards the end of the pregnancy, required maximum medication dosage and was induced early. - Attempted to taper off enalapril  previously, but blood pressure was not controlled, so continued the medication. - Reports checking blood pressure at home, which is generally okay on enalapril . - History of leg and arm swelling during prior pregnancy, which was attributed to the pregnancy itself. - Had one previous delivery on 08/01/2018. - Uncertain about the safety of Imitrex  during pregnancy; reports not having many migraines during the last pregnancy.  Medications Current medications include Seroquel, Zoloft (sertraline), enalapril , Wegovy , Zofran  as needed, and Imitrex  as needed. Previously on Lexapro  during last pregnancy, switched to Zoloft postpartum.  PMH, PSH, FH, Social Hx PMHx: Hypertension, migraines. PSH: One prior delivery (08/01/2018). Social Hx: Planning to try for conception starting around September/October 2024.  ROS Pertinent negatives: No new issues or symptoms reported. Pertinent positives: Reports checking BP at home.   The ASCVD Risk score (Arnett DK, et al., 2019) failed to calculate for the following reasons:   The 2019 ASCVD risk score is only valid for ages 31 to 55  Health Maintenance Due  Topic Date Due   Hepatitis C Screening  Never done   Hepatitis B Vaccines (1 of 3 - 19+ 3-dose series) Never done   HPV VACCINES (1 - 3-dose SCDM series) Never done   COVID-19 Vaccine (4 - 2024-25 season) 01/02/2023   Cervical Cancer Screening  (HPV/Pap Cotest)  03/21/2023      Objective:     BP 121/84   Pulse 76   Ht 5' (1.524 m)   Wt 146 lb 6.4 oz (66.4 kg)   LMP 11/07/2023   SpO2 99%   BMI 28.59 kg/m    Physical Exam Gen: alert, oriented Pulm: no resp distress Psych: pleasant affect    No results found for any visits on 11/10/23.      Assessment & Plan:   Class 1 obesity due to excess calories without serious comorbidity with body mass index (BMI) of 34.0 to 34.9 in adult Assessment & Plan: Patient stopping Wegovy  due to desire to become pregnant.  Weight today 146.   Primary hypertension Assessment & Plan: Stopping enalapril , starting labetalol  which she took during pregnancy.  Discussed contraindication of taking enalapril  during pregnancy and patient is planning on trying to conceive in the fall.  Advised patient to check blood pressure at home and if above goal send us  her results and we can change her labetalol  dosing.  Starting at 200 mg twice daily.   Anxiety Assessment & Plan: Patient to discuss with her psychiatrist about quetiapine and sertraline during pregnancy as well as alternatives.     Other orders -     Labetalol  HCl; Take 1 tablet (200 mg total) by mouth 2 (two) times daily.  Dispense: 60 tablet; Refill: 3     Return in about 6 months (around 05/12/2024) for  bp f/u.    Toribio MARLA Slain, MD

## 2023-11-10 NOTE — Assessment & Plan Note (Signed)
 Stopping enalapril , starting labetalol  which she took during pregnancy.  Discussed contraindication of taking enalapril  during pregnancy and patient is planning on trying to conceive in the fall.  Advised patient to check blood pressure at home and if above goal send us  her results and we can change her labetalol  dosing.  Starting at 200 mg twice daily.

## 2023-11-10 NOTE — Assessment & Plan Note (Signed)
 Patient to discuss with her psychiatrist about quetiapine and sertraline during pregnancy as well as alternatives.

## 2023-11-12 ENCOUNTER — Other Ambulatory Visit: Payer: Self-pay | Admitting: Family Medicine

## 2023-11-12 DIAGNOSIS — Z419 Encounter for procedure for purposes other than remedying health state, unspecified: Secondary | ICD-10-CM | POA: Diagnosis not present

## 2023-11-21 ENCOUNTER — Encounter: Payer: Self-pay | Admitting: Family Medicine

## 2023-11-21 ENCOUNTER — Ambulatory Visit: Admitting: Family Medicine

## 2023-11-21 VITALS — BP 117/76 | HR 82 | Wt 152.0 lb

## 2023-11-21 DIAGNOSIS — F419 Anxiety disorder, unspecified: Secondary | ICD-10-CM | POA: Diagnosis not present

## 2023-11-21 DIAGNOSIS — N96 Recurrent pregnancy loss: Secondary | ICD-10-CM | POA: Diagnosis not present

## 2023-11-21 DIAGNOSIS — Z3169 Encounter for other general counseling and advice on procreation: Secondary | ICD-10-CM | POA: Diagnosis not present

## 2023-11-21 MED ORDER — HYDROXYZINE HCL 25 MG PO TABS: 25.0000 mg | ORAL_TABLET | Freq: Every evening | ORAL | 6 refills | Status: AC | PRN

## 2023-11-21 MED ORDER — PNV PRENATAL PLUS MULTIVITAMIN 27-1 MG PO TABS: 1.0000 | ORAL_TABLET | Freq: Every day | ORAL | 11 refills | Status: AC

## 2023-11-21 NOTE — Progress Notes (Unsigned)
 RGYN here to discuss trying to conceive.  Pt has not officially started tried to conceive yet but would like to discuss hx and B/P and medication management.

## 2023-11-22 ENCOUNTER — Other Ambulatory Visit: Payer: Self-pay | Admitting: Family Medicine

## 2023-11-22 MED ORDER — LABETALOL HCL 200 MG PO TABS: 400.0000 mg | ORAL_TABLET | Freq: Two times a day (BID) | ORAL | 3 refills | Status: DC

## 2023-11-23 ENCOUNTER — Encounter: Payer: Self-pay | Admitting: Family Medicine

## 2023-11-23 NOTE — Progress Notes (Signed)
   GYNECOLOGY PROBLEM  VISIT ENCOUNTER NOTE  Subjective:   Dominique Vega is a 33 y.o. [phone] female here for a problem GYN visit.  Current complaints: None--planning a pregnancy and wants to discuss preconception counseling and how to optimize next pregnancy.   Denies abnormal vaginal bleeding, discharge, pelvic pain, problems with intercourse or other gynecologic concerns.    Gynecologic History Patient's last menstrual period was 11/07/2023.  Contraception: none  Health Maintenance Due  Topic Date Due   Hepatitis C Screening  Never done   Hepatitis B Vaccines (1 of 3 - 19+ 3-dose series) Never done   HPV VACCINES (1 - 3-dose SCDM series) Never done   COVID-19 Vaccine (4 - 2024-25 season) 01/02/2023   Cervical Cancer Screening (HPV/Pap Cotest)  03/21/2023    The following portions of the patient's history were reviewed and updated as appropriate: allergies, current medications, past family history, past medical history, past social history, past surgical history and problem list.  Review of Systems Pertinent items are noted in HPI.   Objective:  BP 117/76   Pulse 82   Wt 152 lb (68.9 kg)   LMP 11/07/2023   BMI 29.69 kg/m   Gen: well appearing, NAD HEENT: no scleral icterus CV: RR Lung: Normal WOB Ext: warm well perfused   Assessment and Plan:  1. Recurrent pregnancy loss (Primary) - Hemoglobin A1c - TSH Rfx on Abnormal to Free T4 - Lupus anticoagulant panel( [facility]) - Factor 5 leiden - Prenatal Vit-Fe Fumarate-FA (PNV PRENATAL PLUS MULTIVITAMIN) 27-1 MG TABS; Take 1 tablet by mouth daily.  Dispense: 30 tablet; Refill: 11  2. Anxiety - hydrOXYzine  (ATARAX ) 25 MG tablet; Take 1 tablet (25 mg total) by mouth at bedtime as needed (Sleep).  Dispense: 30 tablet; Refill: 6  3. Encounter for preconception consultation Patient is planning on conceiving. We reviewed her current problems and medications in terms of pregnancy safety.  Additionally discussed fertility awareness, when to take a pregnancy test, and starting a prenatal vitamin. We discussed general early pregnancy precautions. Reviewed services at the office regarding prenatal care. She voiced understanding.  Problem specific recommendations are: - Reviewed BP meds- safe in pregnancy. Reviewed risk of repeat PEC - Anxiety- zoloft helping, reviewed seroquel and lack of safety data. Recommended change to hydroxyzine .  - Will get recurrent loss labs now that she is planning another pregnancy   Please refer to After Visit Summary for other counseling recommendations.   No follow-ups on file.  Suzen Maryan Masters, MD, MPH, ABFM Attending Physician Faculty [facility]

## 2023-11-25 LAB — LUPUS ANTICOAGULANT PANEL
Dilute Viper Venom Time: 25 s (ref 0.0–47.0)
PTT Lupus Anticoagulant: 26.7 s (ref 0.0–43.5)

## 2023-11-25 LAB — FACTOR 5 LEIDEN

## 2023-11-25 LAB — TSH RFX ON ABNORMAL TO FREE T4: TSH: 1.29 u[IU]/mL (ref 0.450–4.500)

## 2023-11-25 LAB — HEMOGLOBIN A1C
Est. average glucose Bld gHb Est-mCnc: 97 mg/dL
Hgb A1c MFr Bld: 5 % (ref 4.8–5.6)

## 2023-11-28 ENCOUNTER — Ambulatory Visit: Payer: Self-pay | Admitting: Family Medicine

## 2023-12-07 DIAGNOSIS — F411 Generalized anxiety disorder: Secondary | ICD-10-CM | POA: Diagnosis not present

## 2023-12-07 DIAGNOSIS — F332 Major depressive disorder, recurrent severe without psychotic features: Secondary | ICD-10-CM | POA: Diagnosis not present

## 2023-12-07 DIAGNOSIS — G4709 Other insomnia: Secondary | ICD-10-CM | POA: Diagnosis not present

## 2023-12-13 DIAGNOSIS — Z419 Encounter for procedure for purposes other than remedying health state, unspecified: Secondary | ICD-10-CM | POA: Diagnosis not present

## 2023-12-29 ENCOUNTER — Ambulatory Visit: Admitting: Family Medicine

## 2024-01-13 DIAGNOSIS — Z419 Encounter for procedure for purposes other than remedying health state, unspecified: Secondary | ICD-10-CM | POA: Diagnosis not present

## 2024-02-05 ENCOUNTER — Other Ambulatory Visit: Payer: Self-pay | Admitting: Family Medicine

## 2024-03-12 ENCOUNTER — Other Ambulatory Visit: Payer: Self-pay | Admitting: Family Medicine

## 2024-03-13 ENCOUNTER — Encounter: Payer: Self-pay | Admitting: Family Medicine

## 2024-03-13 ENCOUNTER — Other Ambulatory Visit: Payer: Self-pay | Admitting: Family Medicine

## 2024-03-13 MED ORDER — QUETIAPINE FUMARATE 25 MG PO TABS: 25.0000 mg | ORAL_TABLET | Freq: Every day | ORAL | 0 refills | Status: AC

## 2024-03-14 ENCOUNTER — Other Ambulatory Visit: Payer: Self-pay | Admitting: Family Medicine

## 2024-03-14 MED ORDER — SERTRALINE HCL 50 MG PO TABS: 50.0000 mg | ORAL_TABLET | Freq: Every day | ORAL | 0 refills | Status: AC

## 2024-04-13 DIAGNOSIS — Z419 Encounter for procedure for purposes other than remedying health state, unspecified: Secondary | ICD-10-CM | POA: Diagnosis not present

## 2024-04-21 ENCOUNTER — Other Ambulatory Visit: Payer: Self-pay | Admitting: Family Medicine

## 2024-05-02 DIAGNOSIS — F332 Major depressive disorder, recurrent severe without psychotic features: Secondary | ICD-10-CM | POA: Diagnosis not present

## 2024-05-02 DIAGNOSIS — F411 Generalized anxiety disorder: Secondary | ICD-10-CM | POA: Diagnosis not present

## 2024-05-02 DIAGNOSIS — G4709 Other insomnia: Secondary | ICD-10-CM | POA: Diagnosis not present

## 2024-05-14 ENCOUNTER — Ambulatory Visit: Admitting: Family Medicine

## 2024-05-17 ENCOUNTER — Ambulatory Visit: Admitting: Family Medicine

## 2024-06-01 ENCOUNTER — Telehealth: Payer: Self-pay | Admitting: *Deleted

## 2024-06-01 NOTE — Telephone Encounter (Signed)
 LVM informing pt that we are changing her appt on Monday to be a virtual visit due to weather.  If she is ok with this she will just log into her mychart to connect with him.

## 2024-06-04 ENCOUNTER — Ambulatory Visit: Admitting: Family Medicine

## 2024-06-12 ENCOUNTER — Ambulatory Visit: Admitting: Family Medicine
